# Patient Record
Sex: Female | Born: 1950
Health system: Southern US, Community
[De-identification: ages and names within clinical notes are randomized; demographics above are authoritative.]

## PROBLEM LIST (undated history)

## (undated) DIAGNOSIS — M545 Low back pain, unspecified: Secondary | ICD-10-CM

## (undated) DIAGNOSIS — G629 Polyneuropathy, unspecified: Secondary | ICD-10-CM

## (undated) DIAGNOSIS — F419 Anxiety disorder, unspecified: Secondary | ICD-10-CM

## (undated) DIAGNOSIS — E079 Disorder of thyroid, unspecified: Secondary | ICD-10-CM

## (undated) DIAGNOSIS — I10 Essential (primary) hypertension: Secondary | ICD-10-CM

## (undated) DIAGNOSIS — N200 Calculus of kidney: Secondary | ICD-10-CM

## (undated) DIAGNOSIS — K219 Gastro-esophageal reflux disease without esophagitis: Secondary | ICD-10-CM

## (undated) DIAGNOSIS — E039 Hypothyroidism, unspecified: Secondary | ICD-10-CM

## (undated) DIAGNOSIS — Z9049 Acquired absence of other specified parts of digestive tract: Secondary | ICD-10-CM

## (undated) DIAGNOSIS — G43909 Migraine, unspecified, not intractable, without status migrainosus: Secondary | ICD-10-CM

## (undated) DIAGNOSIS — Z90711 Acquired absence of uterus with remaining cervical stump: Secondary | ICD-10-CM

## (undated) DIAGNOSIS — C8590 Non-Hodgkin lymphoma, unspecified, unspecified site: Secondary | ICD-10-CM

## (undated) DIAGNOSIS — Z9221 Personal history of antineoplastic chemotherapy: Secondary | ICD-10-CM

## (undated) HISTORY — PX: CARPAL TUNNEL RELEASE: SHX101

## (undated) HISTORY — DX: Acquired absence of uterus with remaining cervical stump: Z90.711

## (undated) HISTORY — DX: Low back pain, unspecified: M54.50

## (undated) HISTORY — PX: APPENDECTOMY: SHX54

## (undated) HISTORY — DX: Migraine, unspecified, not intractable, without status migrainosus: G43.909

## (undated) HISTORY — PX: CERVICAL SPINE SURGERY: SHX589

## (undated) HISTORY — DX: Disorder of thyroid, unspecified: E07.9

## (undated) HISTORY — DX: Polyneuropathy, unspecified: G62.9

## (undated) HISTORY — DX: Gastro-esophageal reflux disease without esophagitis: K21.9

## (undated) HISTORY — PX: ABDOMINAL HYSTERECTOMY: SHX81

## (undated) HISTORY — PX: TONSILLECTOMY: SUR1361

## (undated) HISTORY — DX: Non-Hodgkin lymphoma, unspecified, unspecified site: C85.90

## (undated) HISTORY — DX: Anxiety disorder, unspecified: F41.9

## (undated) HISTORY — DX: Calculus of kidney: N20.0

---

## 1995-07-15 HISTORY — PX: BREAST BIOPSY: SHX20

## 1998-05-04 ENCOUNTER — Inpatient Hospital Stay (HOSPITAL_COMMUNITY): Admission: RE | Admit: 1998-05-04 | Discharge: 1998-05-06 | Payer: Self-pay | Admitting: Neurosurgery

## 2004-10-26 ENCOUNTER — Ambulatory Visit: Payer: Self-pay | Admitting: Family Medicine

## 2004-10-28 ENCOUNTER — Emergency Department: Payer: Self-pay | Admitting: Emergency Medicine

## 2004-10-28 ENCOUNTER — Other Ambulatory Visit: Payer: Self-pay

## 2004-10-29 ENCOUNTER — Ambulatory Visit: Payer: Self-pay | Admitting: Emergency Medicine

## 2004-11-13 ENCOUNTER — Ambulatory Visit: Payer: Self-pay | Admitting: Family Medicine

## 2005-05-02 ENCOUNTER — Ambulatory Visit: Payer: Self-pay | Admitting: Rheumatology

## 2005-08-14 ENCOUNTER — Ambulatory Visit: Payer: Self-pay | Admitting: General Practice

## 2005-09-16 ENCOUNTER — Ambulatory Visit: Payer: Self-pay | Admitting: Gastroenterology

## 2005-10-16 ENCOUNTER — Ambulatory Visit: Payer: Self-pay | Admitting: Gastroenterology

## 2005-12-18 ENCOUNTER — Ambulatory Visit: Payer: Self-pay | Admitting: Obstetrics and Gynecology

## 2006-12-15 ENCOUNTER — Emergency Department: Payer: Self-pay

## 2006-12-30 ENCOUNTER — Ambulatory Visit: Payer: Self-pay | Admitting: Internal Medicine

## 2008-01-21 ENCOUNTER — Ambulatory Visit: Payer: Self-pay | Admitting: Internal Medicine

## 2008-08-30 ENCOUNTER — Ambulatory Visit: Payer: Self-pay | Admitting: Rheumatology

## 2009-01-30 ENCOUNTER — Ambulatory Visit: Payer: Self-pay | Admitting: Internal Medicine

## 2009-02-06 ENCOUNTER — Ambulatory Visit: Payer: Self-pay | Admitting: Internal Medicine

## 2010-02-01 ENCOUNTER — Ambulatory Visit: Payer: Self-pay | Admitting: Internal Medicine

## 2010-09-05 ENCOUNTER — Ambulatory Visit: Payer: Self-pay | Admitting: Podiatry

## 2010-10-08 ENCOUNTER — Ambulatory Visit: Payer: Self-pay | Admitting: Gastroenterology

## 2011-02-07 ENCOUNTER — Ambulatory Visit: Payer: Self-pay | Admitting: Internal Medicine

## 2012-03-30 ENCOUNTER — Ambulatory Visit: Payer: Self-pay | Admitting: Internal Medicine

## 2013-04-01 ENCOUNTER — Ambulatory Visit: Payer: Self-pay | Admitting: Internal Medicine

## 2013-12-08 DIAGNOSIS — C8590 Non-Hodgkin lymphoma, unspecified, unspecified site: Secondary | ICD-10-CM

## 2013-12-08 HISTORY — DX: Non-Hodgkin lymphoma, unspecified, unspecified site: C85.90

## 2013-12-08 HISTORY — PX: AXILLARY LYMPH NODE BIOPSY: SHX5737

## 2014-04-03 ENCOUNTER — Ambulatory Visit: Payer: Self-pay | Admitting: Internal Medicine

## 2014-04-07 ENCOUNTER — Ambulatory Visit: Payer: Self-pay | Admitting: Internal Medicine

## 2014-04-13 ENCOUNTER — Ambulatory Visit: Payer: Self-pay | Admitting: Oncology

## 2014-04-13 LAB — CBC CANCER CENTER
BASOS ABS: 0.1 x10 3/mm (ref 0.0–0.1)
Basophil %: 1 %
EOS ABS: 0.2 x10 3/mm (ref 0.0–0.7)
EOS PCT: 2.4 %
HCT: 44.7 % (ref 35.0–47.0)
HGB: 14.8 g/dL (ref 12.0–16.0)
Lymphocyte #: 3.3 x10 3/mm (ref 1.0–3.6)
Lymphocyte %: 39.8 %
MCH: 31.5 pg (ref 26.0–34.0)
MCHC: 33.1 g/dL (ref 32.0–36.0)
MCV: 95 fL (ref 80–100)
MONOS PCT: 12.5 %
Monocyte #: 1 x10 3/mm — ABNORMAL HIGH (ref 0.2–0.9)
NEUTROS PCT: 44.3 %
Neutrophil #: 3.6 x10 3/mm (ref 1.4–6.5)
Platelet: 293 x10 3/mm (ref 150–440)
RBC: 4.69 10*6/uL (ref 3.80–5.20)
RDW: 14 % (ref 11.5–14.5)
WBC: 8.2 x10 3/mm (ref 3.6–11.0)

## 2014-04-13 LAB — LACTATE DEHYDROGENASE: LDH: 277 U/L — AB (ref 81–246)

## 2014-04-27 DIAGNOSIS — M47816 Spondylosis without myelopathy or radiculopathy, lumbar region: Secondary | ICD-10-CM | POA: Insufficient documentation

## 2014-04-27 DIAGNOSIS — M5116 Intervertebral disc disorders with radiculopathy, lumbar region: Secondary | ICD-10-CM | POA: Insufficient documentation

## 2014-04-27 DIAGNOSIS — M48061 Spinal stenosis, lumbar region without neurogenic claudication: Secondary | ICD-10-CM | POA: Insufficient documentation

## 2014-05-02 ENCOUNTER — Other Ambulatory Visit: Payer: Self-pay | Admitting: Physical Medicine and Rehabilitation

## 2014-05-02 DIAGNOSIS — M48062 Spinal stenosis, lumbar region with neurogenic claudication: Secondary | ICD-10-CM

## 2014-05-02 DIAGNOSIS — M545 Low back pain, unspecified: Secondary | ICD-10-CM

## 2014-05-02 DIAGNOSIS — M47817 Spondylosis without myelopathy or radiculopathy, lumbosacral region: Secondary | ICD-10-CM

## 2014-05-05 ENCOUNTER — Ambulatory Visit: Payer: Self-pay | Admitting: Surgery

## 2014-05-08 ENCOUNTER — Ambulatory Visit: Payer: Self-pay | Admitting: Oncology

## 2014-05-11 LAB — PATHOLOGY REPORT

## 2014-05-24 ENCOUNTER — Ambulatory Visit
Admission: RE | Admit: 2014-05-24 | Discharge: 2014-05-24 | Disposition: A | Discharge status: Home / Self Care | Payer: BC Managed Care – PPO | Source: Ambulatory Visit | Attending: Physical Medicine and Rehabilitation | Admitting: Physical Medicine and Rehabilitation

## 2014-05-24 DIAGNOSIS — M545 Low back pain, unspecified: Secondary | ICD-10-CM

## 2014-05-24 DIAGNOSIS — M47817 Spondylosis without myelopathy or radiculopathy, lumbosacral region: Secondary | ICD-10-CM

## 2014-05-24 DIAGNOSIS — M48062 Spinal stenosis, lumbar region with neurogenic claudication: Secondary | ICD-10-CM

## 2014-06-01 ENCOUNTER — Ambulatory Visit: Payer: Self-pay | Admitting: Oncology

## 2014-06-07 ENCOUNTER — Ambulatory Visit: Payer: Self-pay | Admitting: Oncology

## 2014-06-13 ENCOUNTER — Ambulatory Visit: Payer: Self-pay | Admitting: Oncology

## 2014-06-13 LAB — CBC CANCER CENTER
Basophil #: 0.1 x10 3/mm (ref 0.0–0.1)
Basophil %: 1.1 %
EOS ABS: 0.2 x10 3/mm (ref 0.0–0.7)
Eosinophil %: 4.1 %
HCT: 42.3 % (ref 35.0–47.0)
HGB: 14.2 g/dL (ref 12.0–16.0)
Lymphocyte #: 2.4 x10 3/mm (ref 1.0–3.6)
Lymphocyte %: 45.2 %
MCH: 32 pg (ref 26.0–34.0)
MCHC: 33.5 g/dL (ref 32.0–36.0)
MCV: 95 fL (ref 80–100)
Monocyte #: 0.6 x10 3/mm (ref 0.2–0.9)
Monocyte %: 11.4 %
NEUTROS PCT: 38.2 %
Neutrophil #: 2 x10 3/mm (ref 1.4–6.5)
PLATELETS: 237 x10 3/mm (ref 150–440)
RBC: 4.43 10*6/uL (ref 3.80–5.20)
RDW: 13.6 % (ref 11.5–14.5)
WBC: 5.2 x10 3/mm (ref 3.6–11.0)

## 2014-06-13 LAB — URINALYSIS, COMPLETE
Bacteria: NONE SEEN
Bilirubin,UR: NEGATIVE
Blood: NEGATIVE
Glucose,UR: NEGATIVE mg/dL (ref 0–75)
Ketone: NEGATIVE
Leukocyte Esterase: NEGATIVE
Nitrite: NEGATIVE
Ph: 6 (ref 4.5–8.0)
Protein: NEGATIVE
RBC,UR: 2 /HPF (ref 0–5)
Specific Gravity: 1.016 (ref 1.003–1.030)
Squamous Epithelial: 1
WBC UR: <1 /HPF (ref 0–5)

## 2014-06-13 LAB — COMPREHENSIVE METABOLIC PANEL
ALK PHOS: 88 U/L
ALT: 21 U/L (ref 12–78)
Albumin: 3.7 g/dL (ref 3.4–5.0)
Anion Gap: 7 (ref 7–16)
BILIRUBIN TOTAL: 0.4 mg/dL (ref 0.2–1.0)
BUN: 13 mg/dL (ref 7–18)
CALCIUM: 9 mg/dL (ref 8.5–10.1)
CHLORIDE: 105 mmol/L (ref 98–107)
Co2: 30 mmol/L (ref 21–32)
Creatinine: 0.84 mg/dL (ref 0.60–1.30)
EGFR (African American): >60
EGFR (Non-African Amer.): >60
Glucose: 117 mg/dL — ABNORMAL HIGH (ref 65–99)
Osmolality: 284 (ref 275–301)
POTASSIUM: 3.7 mmol/L (ref 3.5–5.1)
SGOT(AST): 16 U/L (ref 15–37)
Sodium: 142 mmol/L (ref 136–145)
TOTAL PROTEIN: 7 g/dL (ref 6.4–8.2)

## 2014-06-13 LAB — LACTATE DEHYDROGENASE: LDH: 207 U/L (ref 81–246)

## 2014-06-20 LAB — CBC CANCER CENTER
BASOS PCT: 0.9 %
Basophil #: 0.1 x10 3/mm (ref 0.0–0.1)
Eosinophil #: 0.3 x10 3/mm (ref 0.0–0.7)
Eosinophil %: 3.1 %
HCT: 42.9 % (ref 35.0–47.0)
HGB: 14.3 g/dL (ref 12.0–16.0)
LYMPHS PCT: 44.1 %
Lymphocyte #: 3.5 x10 3/mm (ref 1.0–3.6)
MCH: 31.8 pg (ref 26.0–34.0)
MCHC: 33.3 g/dL (ref 32.0–36.0)
MCV: 96 fL (ref 80–100)
MONO ABS: 0.9 x10 3/mm (ref 0.2–0.9)
Monocyte %: 10.8 %
NEUTROS ABS: 3.3 x10 3/mm (ref 1.4–6.5)
Neutrophil %: 41.1 %
PLATELETS: 259 x10 3/mm (ref 150–440)
RBC: 4.49 10*6/uL (ref 3.80–5.20)
RDW: 13.4 % (ref 11.5–14.5)
WBC: 8 x10 3/mm (ref 3.6–11.0)

## 2014-06-27 LAB — CBC CANCER CENTER
BASOS ABS: 0.1 x10 3/mm (ref 0.0–0.1)
Basophil %: 1 %
Eosinophil #: 0.2 x10 3/mm (ref 0.0–0.7)
Eosinophil %: 2.9 %
HCT: 43.1 % (ref 35.0–47.0)
HGB: 14.3 g/dL (ref 12.0–16.0)
LYMPHS ABS: 3.3 x10 3/mm (ref 1.0–3.6)
LYMPHS PCT: 42.1 %
MCH: 31.5 pg (ref 26.0–34.0)
MCHC: 33.1 g/dL (ref 32.0–36.0)
MCV: 95 fL (ref 80–100)
MONOS PCT: 13.7 %
Monocyte #: 1.1 x10 3/mm — ABNORMAL HIGH (ref 0.2–0.9)
Neutrophil #: 3.1 x10 3/mm (ref 1.4–6.5)
Neutrophil %: 40.3 %
Platelet: 251 x10 3/mm (ref 150–440)
RBC: 4.53 10*6/uL (ref 3.80–5.20)
RDW: 13.7 % (ref 11.5–14.5)
WBC: 7.8 x10 3/mm (ref 3.6–11.0)

## 2014-07-08 ENCOUNTER — Ambulatory Visit: Payer: Self-pay | Admitting: Oncology

## 2014-07-11 LAB — CBC CANCER CENTER
BASOS PCT: 1 %
Basophil #: 0.1 x10 3/mm (ref 0.0–0.1)
EOS ABS: 0.2 x10 3/mm (ref 0.0–0.7)
Eosinophil %: 3.6 %
HCT: 41 % (ref 35.0–47.0)
HGB: 13.7 g/dL (ref 12.0–16.0)
LYMPHS ABS: 2.5 x10 3/mm (ref 1.0–3.6)
Lymphocyte %: 39.5 %
MCH: 32 pg (ref 26.0–34.0)
MCHC: 33.3 g/dL (ref 32.0–36.0)
MCV: 96 fL (ref 80–100)
Monocyte #: 0.8 x10 3/mm (ref 0.2–0.9)
Monocyte %: 12.3 %
NEUTROS ABS: 2.8 x10 3/mm (ref 1.4–6.5)
Neutrophil %: 43.6 %
PLATELETS: 251 x10 3/mm (ref 150–440)
RBC: 4.27 10*6/uL (ref 3.80–5.20)
RDW: 13.6 % (ref 11.5–14.5)
WBC: 6.5 x10 3/mm (ref 3.6–11.0)

## 2014-07-11 LAB — COMPREHENSIVE METABOLIC PANEL
ALK PHOS: 84 U/L
ALT: 25 U/L
AST: 23 U/L (ref 15–37)
Albumin: 3.6 g/dL (ref 3.4–5.0)
Anion Gap: 7 (ref 7–16)
BUN: 10 mg/dL (ref 7–18)
Bilirubin,Total: 0.4 mg/dL (ref 0.2–1.0)
CALCIUM: 8.9 mg/dL (ref 8.5–10.1)
CO2: 27 mmol/L (ref 21–32)
Chloride: 107 mmol/L (ref 98–107)
Creatinine: 0.85 mg/dL (ref 0.60–1.30)
EGFR (African American): >60
EGFR (Non-African Amer.): >60
Glucose: 101 mg/dL — ABNORMAL HIGH (ref 65–99)
Osmolality: 280 (ref 275–301)
POTASSIUM: 4 mmol/L (ref 3.5–5.1)
Sodium: 141 mmol/L (ref 136–145)
Total Protein: 7 g/dL (ref 6.4–8.2)

## 2014-08-08 ENCOUNTER — Ambulatory Visit: Payer: Self-pay | Admitting: Oncology

## 2014-08-29 ENCOUNTER — Ambulatory Visit: Payer: Self-pay | Admitting: Oncology

## 2014-09-07 ENCOUNTER — Ambulatory Visit: Payer: Self-pay | Admitting: Oncology

## 2014-10-14 ENCOUNTER — Ambulatory Visit: Payer: Self-pay | Admitting: Family Medicine

## 2014-10-31 ENCOUNTER — Ambulatory Visit: Payer: Self-pay | Admitting: Family Medicine

## 2014-11-29 DIAGNOSIS — C8590 Non-Hodgkin lymphoma, unspecified, unspecified site: Secondary | ICD-10-CM | POA: Insufficient documentation

## 2014-12-04 ENCOUNTER — Ambulatory Visit: Payer: Self-pay | Admitting: Oncology

## 2014-12-04 LAB — CBC CANCER CENTER
BASOS PCT: 1.1 %
Basophil #: 0.1 x10 3/mm (ref 0.0–0.1)
EOS ABS: 0.2 x10 3/mm (ref 0.0–0.7)
Eosinophil %: 2.6 %
HCT: 43.5 % (ref 35.0–47.0)
HGB: 14.5 g/dL (ref 12.0–16.0)
LYMPHS PCT: 46.7 %
Lymphocyte #: 3.3 x10 3/mm (ref 1.0–3.6)
MCH: 31 pg (ref 26.0–34.0)
MCHC: 33.4 g/dL (ref 32.0–36.0)
MCV: 93 fL (ref 80–100)
Monocyte #: 1 x10 3/mm — ABNORMAL HIGH (ref 0.2–0.9)
Monocyte %: 13.7 %
NEUTROS ABS: 2.6 x10 3/mm (ref 1.4–6.5)
NEUTROS PCT: 35.9 %
PLATELETS: 274 x10 3/mm (ref 150–440)
RBC: 4.68 10*6/uL (ref 3.80–5.20)
RDW: 13.6 % (ref 11.5–14.5)
WBC: 7.1 x10 3/mm (ref 3.6–11.0)

## 2014-12-04 LAB — BASIC METABOLIC PANEL
Anion Gap: 7 (ref 7–16)
BUN: 11 mg/dL (ref 7–18)
CALCIUM: 8.9 mg/dL (ref 8.5–10.1)
CHLORIDE: 104 mmol/L (ref 98–107)
CO2: 31 mmol/L (ref 21–32)
CREATININE: 0.83 mg/dL (ref 0.60–1.30)
EGFR (Non-African Amer.): >60
Glucose: 121 mg/dL — ABNORMAL HIGH (ref 65–99)
OSMOLALITY: 284 (ref 275–301)
Potassium: 4.5 mmol/L (ref 3.5–5.1)
SODIUM: 142 mmol/L (ref 136–145)

## 2014-12-04 LAB — LACTATE DEHYDROGENASE: LDH: 205 U/L (ref 81–246)

## 2014-12-08 ENCOUNTER — Ambulatory Visit: Payer: Self-pay | Admitting: Oncology

## 2014-12-15 ENCOUNTER — Ambulatory Visit: Payer: Self-pay | Admitting: Orthopedic Surgery

## 2015-01-08 ENCOUNTER — Ambulatory Visit: Payer: Self-pay | Admitting: Orthopedic Surgery

## 2015-01-08 HISTORY — PX: JOINT REPLACEMENT: SHX530

## 2015-01-08 LAB — URINALYSIS, COMPLETE
Bacteria: NONE SEEN
Bilirubin,UR: NEGATIVE
Blood: NEGATIVE
Glucose,UR: NEGATIVE mg/dL
Ketone: NEGATIVE
Nitrite: NEGATIVE
Ph: 7
Protein: NEGATIVE
RBC,UR: NONE SEEN /HPF
Specific Gravity: 1.017
Squamous Epithelial: <1
Transitional Epi: 1
WBC UR: 4 /HPF

## 2015-01-08 LAB — PROTIME-INR
INR: 1
Prothrombin Time: 13.6 secs

## 2015-01-08 LAB — CBC
HCT: 43.6 % (ref 35.0–47.0)
HGB: 14.5 g/dL (ref 12.0–16.0)
MCH: 30.9 pg (ref 26.0–34.0)
MCHC: 33.2 g/dL (ref 32.0–36.0)
MCV: 93 fL (ref 80–100)
Platelet: 246 10*3/uL (ref 150–440)
RBC: 4.68 10*6/uL (ref 3.80–5.20)
RDW: 13.8 % (ref 11.5–14.5)
WBC: 6.2 10*3/uL (ref 3.6–11.0)

## 2015-01-08 LAB — BASIC METABOLIC PANEL WITH GFR
Anion Gap: 6 — ABNORMAL LOW
BUN: 11 mg/dL
Calcium, Total: 9.3 mg/dL
Chloride: 107 mmol/L
Co2: 26 mmol/L
Creatinine: 0.63 mg/dL
EGFR (African American): >60
EGFR (Non-African Amer.): >60
Glucose: 96 mg/dL
Osmolality: 277
Potassium: 4.3 mmol/L
Sodium: 139 mmol/L

## 2015-01-08 LAB — SEDIMENTATION RATE: Erythrocyte Sed Rate: 4 mm/h

## 2015-01-08 LAB — MRSA PCR SCREENING

## 2015-01-08 LAB — APTT: Activated PTT: 28.1 s

## 2015-01-16 ENCOUNTER — Inpatient Hospital Stay: Payer: Self-pay | Admitting: Orthopedic Surgery

## 2015-03-19 ENCOUNTER — Ambulatory Visit: Payer: Self-pay | Admitting: Podiatry

## 2015-03-23 ENCOUNTER — Other Ambulatory Visit: Payer: Self-pay | Admitting: Oncology

## 2015-03-23 DIAGNOSIS — C8298 Follicular lymphoma, unspecified, lymph nodes of multiple sites: Secondary | ICD-10-CM

## 2015-03-28 ENCOUNTER — Ambulatory Visit (INDEPENDENT_AMBULATORY_CARE_PROVIDER_SITE_OTHER): Payer: Medicare Other | Admitting: Podiatry

## 2015-03-28 ENCOUNTER — Encounter: Payer: Self-pay | Admitting: Podiatry

## 2015-03-28 ENCOUNTER — Ambulatory Visit (INDEPENDENT_AMBULATORY_CARE_PROVIDER_SITE_OTHER): Payer: Medicare Other

## 2015-03-28 VITALS — BP 143/74 | HR 60 | Resp 16

## 2015-03-28 DIAGNOSIS — G5760 Lesion of plantar nerve, unspecified lower limb: Secondary | ICD-10-CM

## 2015-03-28 DIAGNOSIS — M79676 Pain in unspecified toe(s): Secondary | ICD-10-CM | POA: Diagnosis not present

## 2015-03-28 DIAGNOSIS — G588 Other specified mononeuropathies: Secondary | ICD-10-CM

## 2015-03-28 DIAGNOSIS — M779 Enthesopathy, unspecified: Secondary | ICD-10-CM

## 2015-03-28 NOTE — Progress Notes (Signed)
   Subjective:    Patient ID: Kaitlin Thompson, female    DOB: 1951/04/09, 64 y.o.   MRN: [id number]  HPI Comments: "I have problems with my toes on both feet"  Patient c/o stinging sensations tips of 3rd and 4th toes bilateral for a couple years, worsened over the years. She had foot surgery by Dr Selina Cooley few years ago. Feels like a blister on the ends of the toes. Sometimes feels like rocks in her shoes. Some swelling. Last year went throught treatment for non hodgkins lymphoma. Dr. Elvina Mattes injected the forefoot a couple months ago and she says made symptoms worse.     Review of Systems  HENT: Positive for ear pain, sinus pressure, sneezing and tinnitus.   Eyes: Positive for redness and itching.  Cardiovascular: Positive for leg swelling.  Gastrointestinal: Positive for abdominal distention.  Genitourinary: Positive for frequency.  Musculoskeletal: Positive for back pain and arthralgias.  Neurological: Positive for dizziness, light-headedness and headaches.  Hematological: Positive for adenopathy.  All other systems reviewed and are negative.      Objective:   Physical Exam: I have reviewed her past medical history medications allergy surgery social history and review of systems. Pulses are strongly palpable bilateral. Neurologic sensorium is intact bilateral. She does have pain on palpation to the third interdigital space with a palpable Mulder's click. Deep tendon reflexes are intact muscle strength is intact bilateral. Orthopedic evaluation demonstrates all joints distal to the ankle have a full range of motion without crepitation. Radiographic evaluation does demonstrate second metatarsal osteotomy bilateral with screw fixation.        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  Plan: Started dehydrated alcohol injections to the bilateral foot today and I will follow-up with her in 3 weeks for her second dose.

## 2015-03-31 NOTE — Op Note (Signed)
PATIENT NAME:  Kaitlin Thompson, Kaitlin Thompson MR#:  [id number] DATE OF BIRTH:  [birth year]  DATE OF PROCEDURE:  05/05/2014  PREOPERATIVE DIAGNOSIS:  Axillary adenopathy.   POSTOPERATIVE DIAGNOSIS:  Axillary adenopathy.   PROCEDURE:  Excision of left axillary lymph node.   SURGEON:  Loreli Dollar, M.D.   ANESTHESIA:  General.   INDICATIONS:  This 64 year old female has had recent findings of diffuse enlargement of lymph nodes. CT scan demonstrated bilateral axillary adenopathy and excision of left axillary lymph node was recommended for a further evaluation.   DESCRIPTION OF PROCEDURE:  The patient was placed on the operating table in the supine position under general anesthesia. The left arm was placed on a lateral arm support. The left axilla, left breast, upper arm and chest wall were prepared with ChloraPrep and draped in a sterile manner.   Immediately prior to incision, the axilla was palpated, and I did not feel a lymph node; however, an incision was made in the inferior aspect of the axilla and carried down through subcutaneous tissues. The superficial fascia was incised exposing the axillary fat pad. Further dissection was carried down through this fat pad to encounter a 2-cm lymph node deep within the axilla adjacent to the chest wall. This lymph node was smooth and of typical firmness. It was dissected free from surrounding structures using electrocautery for hemostasis. The vascular pedicle was suture ligated with 4-0 chromic, and the lymph node was excised and was submitted fresh for pathology, possible lymphoma workup. The wound was further inspected. It is noted that during the course of the procedure, a number of small bleeding points were cauterized. Hemostasis was intact. The superficial fascia was closed with interrupted 4-0 chromic, and the skin was closed with running 5-0 Monocryl subcuticular suture and Dermabond. The patient tolerated the procedure satisfactorily and was then prepared for a  transfer to the recovery room.   ____________________________ Lenna Sciara. Rochel Brome, MD jws:jm D: 05/05/2014 11:38:54 ET T: 05/05/2014 12:23:26 ET JOB#: [id number]  cc: Loreli Dollar, MD, <Dictator> Loreli Dollar MD ELECTRONICALLY SIGNED 05/05/2014 19:30

## 2015-04-02 LAB — SURGICAL PATHOLOGY

## 2015-04-05 ENCOUNTER — Ambulatory Visit
Admit: 2015-04-05 | Disposition: A | Discharge status: Home / Self Care | Payer: Self-pay | Attending: Internal Medicine | Admitting: Internal Medicine

## 2015-04-08 NOTE — Discharge Summary (Signed)
PATIENT NAME:  Kaitlin Thompson, Kaitlin Thompson MR#:  [id number] DATE OF BIRTH:  1951-12-07  DATE OF ADMISSION:  01/16/2015 DATE OF DISCHARGE:  01/19/2015   ADMITTING DIAGNOSIS: Left knee osteoarthritis.   DISCHARGE DIAGNOSIS: Left knee osteoarthritis.   PROCEDURE: Left total knee replacement.   SURGEON: Laurene Footman, MD   ASSISTANT: Rachelle Hora, PA-C   ANESTHESIA: Spinal.   ESTIMATED BLOOD LOSS: 200 mL.   COMPLICATIONS: None.   SPECIMEN: Cut ends of bone.   TOURNIQUET TIME: 84 minutes at 300 mmHg.   IMPLANTS: Medacta GMK sphere 2+ left femur, with an 11 mm x 30 stem on the tibia, with a 2 left fixed tibial component, 2 left 12 mm Flex insert, and a size 2 patella. All components cemented.   HISTORY: The patient is a 64 year old female who has had significant left knee pain which has interfered with her activities of daily living. She has failed nonoperative treatment options, including NSAIDs, pain medications, cortisone shots and viscous supplementation. The patient has agreed to consent to a left total knee arthroplasty.   PHYSICAL EXAMINATION:  GENERAL: Well developed, well nourished female in no apparent distress. Normal affect. Antalgic component to the left lower extremity.  HEENT: Head normocephalic, atraumatic. Pupils equal, round and reactive to light.  HEART: Regular rate and rhythm.  LUNGS: Clear to auscultation bilaterally. No wheezing, rales or rhonchi.  LEFT KNEE: Examination of the left knee shows the patient has no swelling, warmth,  erythema or effusion. She is tender along the medial and lateral joint lines. She has painful range of motion with crepitus. She is neurovascularly intact in the left lower extremity. She has a negative Homan sign.   HOSPITAL COURSE: The patient was admitted to the hospital on 01/16/2015. She had surgery that same day and was brought to the orthopedic floor from the PACU in stable condition. On postoperative day 1, the patient had normal labs and  vital signs. She had good progress with physical therapy. On postoperative day 2, she continued to progress with physical therapy. On postoperative day 3, she continued to have good progress with physical therapy. By 01/19/2015, postoperative day three3 the patient was stable and ready for discharge home with home health and home PT.   CONDITION AT DISCHARGE: Stable.   DISCHARGE INSTRUCTIONS: The patient may increase weightbearing on the affected extremity. Elevate the affected foot or leg on 1-2 pillows with the foot higher than the knee. Thigh-high TED hose on both legs and remove at bedtime; replace when arising the next morning. Elevate the heels off the bed. Incentive spirometer every hour while awake. Encourage cough and deep breathing. The patient may resume a regular diet as tolerated. Continue using Polar Care unit maintaining a temp between 40-50 degrees. Do not get the dressing or bandage wet or dirty. Call [facility] if the dressing has water under it. Leave the dressing on. Call [facility] if any of the following occur: Bright red bleeding from the incisional wound, fever above 101.5 degrees, redness, swelling, or drainage at the incision site. Call [facility] if you experience any increased leg pain, numbness, weakness in legs, or bowel or bladder symptoms. Home physical therapy has been arranged for continuation of rehab program. Call [facility] for followup in 2 weeks.   DISCHARGE MEDICATIONS: Please see the list of discharge medications on the discharge instructions.    ____________________________ T. Rachelle Hora, PA-C tcg:MT D: 01/19/2015 09:52:05 ET T: 01/19/2015 10:35:55 ET JOB#: [id number]  cc:  Ronney Asters, PA-C, <Dictator> Duanne Guess PA ELECTRONICALLY SIGNED 01/25/2015 12:15

## 2015-04-08 NOTE — Op Note (Signed)
PATIENT NAME:  Kaitlin Thompson, GALLERY MR#:  [id number] DATE OF BIRTH:  Sep 20, 1951  DATE OF PROCEDURE:  01/16/2015  PREOPERATIVE DIAGNOSIS: Left knee osteoarthritis.   POSTOPERATIVE DIAGNOSIS: Left knee osteoarthritis.   PROCEDURE: Left total knee replacement.   SURGEON: Hessie Knows, MD   ASSISTANT: Rachelle Hora, PA-C   ANESTHESIA: Spinal.   DESCRIPTION OF PROCEDURE: The patient was brought to the operating room and after adequate spinal anesthesia was obtained, the left leg was prepped and draped in the usual sterile fashion with a tourniquet applied to the upper thigh. After patient identification, timeout procedures were completed. The leg was exsanguinated with an Esmarch and the tourniquet raised to 300 mmHg. A midline skin incision was made, followed by a medial parapatellar arthrotomy. Inspection of the knee revealed extensive synovitis as well as significant wear to the medial joint and patellofemoral joint. The ACL and PCL were excised along with the anterior horns of the menisci. The proximal tibia was exposed for application of the Toluca cutting guide. After this was placed, an alignment checked, the proximal tibia cut was carried out and the proximal tibia cut removed. Identical procedure carried out on the femur with femoral guide attached. After removing cartilage in the appropriate areas, the distal femoral cut was made, pins placed from the block and the 2+ cutting guide was applied. Anterior, posterior and chamfer cuts made. Residual posterior horn of the menisci were excised and the size 2 tibial tray was inserted, drilled, and short stem preparation carried out. The 2 implant was then impacted into place and fit well. Next, on the femoral side, the 2+ trial was placed with a 12 mm insert. There was good stability and full extension. The distal femoral drill holes were made and the trochlear groove cut was carried out at this time. The trials were removed and the patella cut with patellar  cutting guide. It measured to a size 2 after drill holes were placed. Tourniquet was let down and hemostasis checked with electrocautery. The knee was infiltrated with Exparel diluted with saline in multiple locations along with a combination of Toradol, morphine and Sensorcaine with epinephrine 0.25%. After this was completed, the tourniquet was raised and the bony surfaces thoroughly irrigated and dried. The tibial component was cemented into place first, followed by the 12 mm insert and then the femoral component. The knee was held in extension as the cement set and excess cement had been removed. The patellar button was clamped into place. After the cement had set, the knee was checked, the patella tracked well with no-touch technique. The knee was thoroughly irrigated and Betadine lavage also carried out. The arthrotomy was then repaired with a heavy Quill suture, 2-0 Quill subcutaneously and skin staples. Xeroform, 4 x 4's, ABD, Webril, Polar Care and Ace wrap were then applied. The patient was sent to the recovery room in stable condition.   ESTIMATED BLOOD LOSS: 200 mL.   COMPLICATIONS: None.   SPECIMEN: Cut ends of bone.   TOURNIQUET TIME: Eighty-four minutes at 300 mmHg.  IMPLANTS: Medacta GMK sphere 2+ left femur with an 11 mm x 30 stem on the tibia with a 2 left fixed tibia component, 2 left 12 mm flex insert, and a size 2 patella. All components cemented.   ____________________________ Laurene Footman, MD mjm:TM D: 01/16/2015 21:35:00 ET T: 01/17/2015 00:47:23 ET JOB#: [id number]  cc: Laurene Footman, MD, <Dictator> Laurene Footman MD ELECTRONICALLY SIGNED 01/17/2015 8:21

## 2015-04-18 ENCOUNTER — Ambulatory Visit (INDEPENDENT_AMBULATORY_CARE_PROVIDER_SITE_OTHER): Payer: Medicare Other | Admitting: Podiatry

## 2015-04-18 VITALS — BP 147/82 | HR 66 | Resp 16

## 2015-04-18 DIAGNOSIS — G588 Other specified mononeuropathies: Secondary | ICD-10-CM

## 2015-04-18 DIAGNOSIS — G5760 Lesion of plantar nerve, unspecified lower limb: Secondary | ICD-10-CM

## 2015-04-19 NOTE — Progress Notes (Signed)
She presents today for follow-up of neuroma third interdigital space bilateral. She states that after the first alcohol injection does very little change.  Objective: Vital signs are stable she is alert and oriented 3 she has palpable Mulder's click to the third interdigital space bilateral.  Assessment: Neuroma third interdigital space bilateral.  Plan: Reinjected her second dose of dehydrated alcohol bilaterally today to the third interdigital space. Follow-up with her in 3 weeks

## 2015-05-09 ENCOUNTER — Ambulatory Visit (INDEPENDENT_AMBULATORY_CARE_PROVIDER_SITE_OTHER): Payer: Medicare Other | Admitting: Podiatry

## 2015-05-09 VITALS — BP 154/85 | HR 70 | Resp 16

## 2015-05-09 DIAGNOSIS — G5760 Lesion of plantar nerve, unspecified lower limb: Secondary | ICD-10-CM | POA: Diagnosis not present

## 2015-05-09 DIAGNOSIS — G588 Other specified mononeuropathies: Secondary | ICD-10-CM

## 2015-05-09 NOTE — Progress Notes (Signed)
She presents today for follow-up of neuroma third interdigital space bilateral. She states they're still sore but occasionally she can feel some improvement.  Objective: Vital signs stable she is alert and oriented 3 palpable Mulder's click third interdigital space bilateral.  Assessment: Neuroma third interdigital space bilateral.  Plan: Injected dehydrated alcohol 2 bilateral third interdigital space today this was injection #3. I will follow up with her in 3 weeks for #4 if necessary.

## 2015-05-30 ENCOUNTER — Ambulatory Visit: Payer: Medicare Other | Admitting: Podiatry

## 2015-06-04 ENCOUNTER — Other Ambulatory Visit: Payer: Self-pay | Admitting: *Deleted

## 2015-06-04 ENCOUNTER — Ambulatory Visit (INDEPENDENT_AMBULATORY_CARE_PROVIDER_SITE_OTHER): Payer: Medicare Other | Admitting: Podiatry

## 2015-06-04 VITALS — BP 152/88 | HR 68 | Resp 16

## 2015-06-04 DIAGNOSIS — D3610 Benign neoplasm of peripheral nerves and autonomic nervous system, unspecified: Secondary | ICD-10-CM | POA: Diagnosis not present

## 2015-06-04 DIAGNOSIS — R591 Generalized enlarged lymph nodes: Secondary | ICD-10-CM

## 2015-06-04 NOTE — Progress Notes (Signed)
She presents today for follow-up of neuroma third interdigital space bilateral. She states still sore but she does have some improvement.  Objective: Vital signs stable she is alert and oriented 3 palpable Mulder's click third interdigital space bilateral.  Assessment: Neuroma third interdigital space bilateral.  Plan: Injected dehydrated alcohol to bilateral third interdigital spaces today this was injection #4. I will follow up with her in 5 weeks for injection #5 if needed.

## 2015-06-05 ENCOUNTER — Other Ambulatory Visit: Payer: Self-pay | Admitting: *Deleted

## 2015-06-05 DIAGNOSIS — R591 Generalized enlarged lymph nodes: Secondary | ICD-10-CM

## 2015-06-06 ENCOUNTER — Inpatient Hospital Stay
Admission: RE | Admit: 2015-06-06 | Discharge: 2015-06-06 | Disposition: A | Discharge status: Home / Self Care | Payer: Self-pay | Source: Ambulatory Visit | Attending: Oncology | Admitting: Oncology

## 2015-06-06 ENCOUNTER — Inpatient Hospital Stay: Payer: Medicare Other | Attending: Oncology

## 2015-06-06 ENCOUNTER — Other Ambulatory Visit: Payer: Self-pay

## 2015-06-06 ENCOUNTER — Ambulatory Visit
Admission: RE | Admit: 2015-06-06 | Discharge: 2015-06-06 | Disposition: A | Discharge status: Home / Self Care | Payer: Medicare Other | Source: Ambulatory Visit | Attending: Oncology | Admitting: Oncology

## 2015-06-06 DIAGNOSIS — C8298 Follicular lymphoma, unspecified, lymph nodes of multiple sites: Secondary | ICD-10-CM | POA: Insufficient documentation

## 2015-06-06 DIAGNOSIS — R1900 Intra-abdominal and pelvic swelling, mass and lump, unspecified site: Secondary | ICD-10-CM | POA: Diagnosis not present

## 2015-06-06 DIAGNOSIS — C8290 Follicular lymphoma, unspecified, unspecified site: Secondary | ICD-10-CM | POA: Insufficient documentation

## 2015-06-06 DIAGNOSIS — Z08 Encounter for follow-up examination after completed treatment for malignant neoplasm: Secondary | ICD-10-CM | POA: Diagnosis present

## 2015-06-06 DIAGNOSIS — R590 Localized enlarged lymph nodes: Secondary | ICD-10-CM | POA: Diagnosis not present

## 2015-06-06 DIAGNOSIS — R591 Generalized enlarged lymph nodes: Secondary | ICD-10-CM

## 2015-06-06 HISTORY — DX: Essential (primary) hypertension: I10

## 2015-06-06 LAB — CBC WITH DIFFERENTIAL/PLATELET
BASOS ABS: 0.1 10*3/uL (ref 0–0.1)
BASOS PCT: 1 %
EOS PCT: 2 %
Eosinophils Absolute: 0.2 10*3/uL (ref 0–0.7)
HCT: 45.7 % (ref 35.0–47.0)
Hemoglobin: 15 g/dL (ref 12.0–16.0)
Lymphocytes Relative: 51 %
Lymphs Abs: 3.4 10*3/uL (ref 1.0–3.6)
MCH: 30.5 pg (ref 26.0–34.0)
MCHC: 32.8 g/dL (ref 32.0–36.0)
MCV: 92.9 fL (ref 80.0–100.0)
MONO ABS: 0.8 10*3/uL (ref 0.2–0.9)
Monocytes Relative: 11 %
NEUTROS ABS: 2.4 10*3/uL (ref 1.4–6.5)
Neutrophils Relative %: 35 %
Platelets: 275 10*3/uL (ref 150–440)
RBC: 4.92 MIL/uL (ref 3.80–5.20)
RDW: 14 % (ref 11.5–14.5)
WBC: 6.7 10*3/uL (ref 3.6–11.0)

## 2015-06-06 LAB — LACTATE DEHYDROGENASE: LDH: 177 U/L (ref 98–192)

## 2015-06-06 MED ORDER — IOHEXOL 350 MG/ML SOLN
100.0000 mL | Freq: Once | INTRAVENOUS | Status: AC | PRN
  Administered 2015-06-06: 100 mL via INTRAVENOUS

## 2015-06-07 LAB — MISC LABCORP TEST (SEND OUT): Labcorp test code: 480260

## 2015-06-13 ENCOUNTER — Inpatient Hospital Stay: Payer: Medicare Other | Attending: Oncology | Admitting: Oncology

## 2015-06-13 ENCOUNTER — Encounter: Payer: Self-pay | Admitting: Oncology

## 2015-06-13 VITALS — BP 118/90 | HR 78 | Temp 98.0°F | Resp 20 | Wt 213.2 lb

## 2015-06-13 DIAGNOSIS — C8300 Small cell B-cell lymphoma, unspecified site: Secondary | ICD-10-CM | POA: Diagnosis present

## 2015-06-13 DIAGNOSIS — M549 Dorsalgia, unspecified: Secondary | ICD-10-CM | POA: Diagnosis not present

## 2015-06-13 DIAGNOSIS — K219 Gastro-esophageal reflux disease without esophagitis: Secondary | ICD-10-CM | POA: Diagnosis not present

## 2015-06-13 DIAGNOSIS — G8929 Other chronic pain: Secondary | ICD-10-CM | POA: Diagnosis not present

## 2015-06-13 DIAGNOSIS — Z800 Family history of malignant neoplasm of digestive organs: Secondary | ICD-10-CM | POA: Diagnosis not present

## 2015-06-13 DIAGNOSIS — Z807 Family history of other malignant neoplasms of lymphoid, hematopoietic and related tissues: Secondary | ICD-10-CM | POA: Diagnosis not present

## 2015-06-13 DIAGNOSIS — Z803 Family history of malignant neoplasm of breast: Secondary | ICD-10-CM | POA: Insufficient documentation

## 2015-06-13 DIAGNOSIS — Z90710 Acquired absence of both cervix and uterus: Secondary | ICD-10-CM

## 2015-06-13 DIAGNOSIS — Z87442 Personal history of urinary calculi: Secondary | ICD-10-CM | POA: Diagnosis not present

## 2015-06-13 DIAGNOSIS — M25569 Pain in unspecified knee: Secondary | ICD-10-CM | POA: Diagnosis not present

## 2015-06-13 DIAGNOSIS — Z7982 Long term (current) use of aspirin: Secondary | ICD-10-CM | POA: Diagnosis not present

## 2015-06-13 DIAGNOSIS — Z8041 Family history of malignant neoplasm of ovary: Secondary | ICD-10-CM | POA: Diagnosis not present

## 2015-06-13 DIAGNOSIS — C8580 Other specified types of non-Hodgkin lymphoma, unspecified site: Secondary | ICD-10-CM

## 2015-06-13 DIAGNOSIS — Z9221 Personal history of antineoplastic chemotherapy: Secondary | ICD-10-CM

## 2015-06-13 DIAGNOSIS — K5730 Diverticulosis of large intestine without perforation or abscess without bleeding: Secondary | ICD-10-CM | POA: Insufficient documentation

## 2015-06-13 DIAGNOSIS — Z79899 Other long term (current) drug therapy: Secondary | ICD-10-CM | POA: Diagnosis not present

## 2015-06-13 DIAGNOSIS — I10 Essential (primary) hypertension: Secondary | ICD-10-CM | POA: Diagnosis not present

## 2015-06-13 DIAGNOSIS — F419 Anxiety disorder, unspecified: Secondary | ICD-10-CM | POA: Diagnosis not present

## 2015-06-13 DIAGNOSIS — E039 Hypothyroidism, unspecified: Secondary | ICD-10-CM | POA: Diagnosis not present

## 2015-06-13 NOTE — Progress Notes (Signed)
Patient here today for follow up regarding lymphoma, patient reports "lump" to left breast.

## 2015-06-29 NOTE — Progress Notes (Signed)
[facility]  Telephone:(336) [phone] Fax:(336) [phone]  ID: Kaitlin Thompson OB: 1951-02-28  MR#: [id number]  UMP#:[id number]  Patient Care Team: Tracie Harrier, MD as PCP - General (Internal Medicine)  CHIEF COMPLAINT:  Chief Complaint  Patient presents with  . Follow-up    lymphoma    INTERVAL HISTORY: Patient returns to clinic today for further evaluation and discussion of her imaging results. She continues to have chronic back and knee pain, but otherwise feels well. She has no neurologic complaints. She denies any recent fevers. She has no chest pain or shortness of breath. She has a good appetite and denies any nausea, vomiting, constipation, or diarrhea. She has no urinary complaints. Patient offers no further specific complaints today.   REVIEW OF SYSTEMS:   Review of Systems  Constitutional: Negative for fever, weight loss and malaise/fatigue.  Respiratory: Negative.   Cardiovascular: Negative.   Neurological: Negative for weakness.    As per HPI. Otherwise, a complete review of systems is negatve.  PAST MEDICAL HISTORY: Past Medical History  Diagnosis Date  . Non Hodgkin's lymphoma   . Hypertension   . Kidney stones   . Thyroid disease     hypothyroidism  . GERD (gastroesophageal reflux disease)   . Anxiety   . S/P partial hysterectomy     PAST SURGICAL HISTORY: Past Surgical History  Procedure Laterality Date  . Abdominal hysterectomy      FAMILY HISTORY Family History  Problem Relation Age of Onset  . Cancer Other     breast cancer, colon cancer, lymphoma, ovarian cancer  . Diabetes Other   . Anemia Other   . CAD Other   . Liver disease Other        ADVANCED DIRECTIVES:    HEALTH MAINTENANCE: History  Substance Use Topics  . Smoking status: Never Smoker   . Smokeless tobacco: Not on file  . Alcohol Use: Not on file     Colonoscopy:  PAP:  Bone density:  Lipid panel:  Allergies  Allergen Reactions  .  Percocet [Oxycodone-Acetaminophen]     Current Outpatient Prescriptions  Medication Sig Dispense Refill  . ALPRAZolam (XANAX) 0.25 MG tablet Take 0.25 mg by mouth at bedtime as needed for anxiety.    Marland Kitchen amitriptyline (ELAVIL) 25 MG tablet Take 25 mg by mouth at bedtime.    Marland Kitchen aspirin 81 MG tablet Take 81 mg by mouth daily.    Marland Kitchen atenolol (TENORMIN) 50 MG tablet Take 50 mg by mouth daily.    Marland Kitchen levothyroxine (SYNTHROID, LEVOTHROID) 112 MCG tablet Take 112 mcg by mouth daily before breakfast.    . omeprazole (PRILOSEC) 20 MG capsule Take 20 mg by mouth daily.    Marland Kitchen HYDROcodone-acetaminophen (NORCO) 10-325 MG per tablet Take 1 tablet by mouth every 6 (six) hours as needed.     No current facility-administered medications for this visit.    OBJECTIVE: Filed Vitals:   06/13/15 1119  BP: 118/90  Pulse: 78  Temp: 98 F (36.7 C)  Resp: 20     There is no height on file to calculate BMI.    ECOG FS:0 - Asymptomatic  General: Well-developed, well-nourished, no acute distress. Eyes: anicteric sclera. Lungs: Clear to auscultation bilaterally. Heart: Regular rate and rhythm. No rubs, murmurs, or gallops. Abdomen: Soft, nontender, nondistended. No organomegaly noted, normoactive bowel sounds. Musculoskeletal: No edema, cyanosis, or clubbing. Neuro: Alert, answering all questions appropriately. Cranial nerves grossly intact. Skin: No rashes or petechiae noted. Psych: Normal affect. Lymphatics:  No cervical, calvicular, axillary or inguinal LAD.   LAB RESULTS:  Lab Results  Component Value Date   NA 139 01/08/2015   K 4.3 01/08/2015   CL 107 01/08/2015   CO2 26 01/08/2015   GLUCOSE 96 01/08/2015   BUN 11 01/08/2015   CREATININE 0.63 01/08/2015   CALCIUM 9.3 01/08/2015   PROT 7.0 07/11/2014   ALBUMIN 3.6 07/11/2014   AST 23 07/11/2014   ALT 25 07/11/2014   ALKPHOS 84 07/11/2014   BILITOT 0.4 07/11/2014   GFRNONAA >60 07/11/2014   GFRAA >60 07/11/2014    Lab Results  Component  Value Date   WBC 6.7 06/06/2015   NEUTROABS 2.4 06/06/2015   HGB 15.0 06/06/2015   HCT 45.7 06/06/2015   MCV 92.9 06/06/2015   PLT 275 06/06/2015     STUDIES: Ct Chest W Contrast  06/06/2015   CLINICAL DATA:  Lymphoma diagnosed in 2015. Some upper quadrant pain. Patient status post chemotherapy. Subsequent treatment evaluation.  EXAM: CT CHEST, ABDOMEN, AND PELVIS WITH CONTRAST  TECHNIQUE: Multidetector CT imaging of the chest, abdomen and pelvis was performed following the standard protocol during bolus administration of intravenous contrast.  CONTRAST:  137mL OMNIPAQUE IOHEXOL 350 MG/ML SOLN  COMPARISON:  None.  FINDINGS: CT CHEST FINDINGS  Mediastinum/Nodes: 10 mm left axillary lymph node compares to 13 mm on prior, image 8, series 2). Left axial lymph node measures 12 mm short axis on image 11 compared to 12 mm. No new adenopathy. No supraclavicular adenopathy or mediastinal adenopathy. No pericardial fluid. Esophagus normal.  Lungs/Pleura: No pleural fluid. No pulmonary nodularity. Airways are normal.  CT ABDOMEN AND PELVIS FINDINGS  Hepatobiliary: No focal hepatic lesion. No biliary duct dilatation. Gallbladder is normal. Common bile duct is normal.  Pancreas: Pancreas is normal. No ductal dilatation. No pancreatic inflammation.  Spleen: Normal spleen  Adrenals/urinary tract: The small nodules of the left and right adrenal gland are unchanged measuring 13 and 10 mm respectively. These were not hypermetabolic on comparison PET-CT scan and likely represent small adenomas.  Stomach/Bowel: Stomach, small bowel, appendix, and cecum are normal. Multiple diverticula through the sigmoid colon without evidence of acute inflammation.  Vascular/Lymphatic: Abdominal aorta is normal caliber. There are small periaortic lymph nodes ventral less than 10 mm short axis. No central mesenteric adenopathy.  There is a large left external iliac lymph node which measures 23 mm short axis similar 19 mm short axis on CT of  12/04/2014. In coronal dimension lesion measures 18 mm unchanged. Left inguinal lymph node measures 11 mm on image 116, series 2 compared to 10 mm. No new lymph nodes are present.  Reproductive: Rounded mass within the right mid pelvis measures 3.4 by 4.5 cm compared to 3.3 by 3.9 cm. This mass was not significantly metabolic on prior PET-CT scan.  Musculoskeletal: No aggressive osseous lesion.  Other: No free fluid.  IMPRESSION: Chest Impression:  1. Bilateral mild axillary lymphadenopathy is stable. 2. No mediastinal adenopathy. 3. No pulmonary parenchymal lesion.  Abdomen / Pelvis Impression:  1. Essentially stable left external iliac lymph nodes. 2. Stable mass within the central right pelvis. This masses not hypermetabolic on prior PET-CT scan.   Electronically Signed   By: Suzy Bouchard M.D.   On: 06/06/2015 10:33   Ct Abdomen Pelvis W Contrast  06/06/2015   CLINICAL DATA:  Lymphoma diagnosed in 2015. Some upper quadrant pain. Patient status post chemotherapy. Subsequent treatment evaluation.  EXAM: CT CHEST, ABDOMEN, AND PELVIS WITH CONTRAST  TECHNIQUE:  Multidetector CT imaging of the chest, abdomen and pelvis was performed following the standard protocol during bolus administration of intravenous contrast.  CONTRAST:  140mL OMNIPAQUE IOHEXOL 350 MG/ML SOLN  COMPARISON:  None.  FINDINGS: CT CHEST FINDINGS  Mediastinum/Nodes: 10 mm left axillary lymph node compares to 13 mm on prior, image 8, series 2). Left axial lymph node measures 12 mm short axis on image 11 compared to 12 mm. No new adenopathy. No supraclavicular adenopathy or mediastinal adenopathy. No pericardial fluid. Esophagus normal.  Lungs/Pleura: No pleural fluid. No pulmonary nodularity. Airways are normal.  CT ABDOMEN AND PELVIS FINDINGS  Hepatobiliary: No focal hepatic lesion. No biliary duct dilatation. Gallbladder is normal. Common bile duct is normal.  Pancreas: Pancreas is normal. No ductal dilatation. No pancreatic inflammation.   Spleen: Normal spleen  Adrenals/urinary tract: The small nodules of the left and right adrenal gland are unchanged measuring 13 and 10 mm respectively. These were not hypermetabolic on comparison PET-CT scan and likely represent small adenomas.  Stomach/Bowel: Stomach, small bowel, appendix, and cecum are normal. Multiple diverticula through the sigmoid colon without evidence of acute inflammation.  Vascular/Lymphatic: Abdominal aorta is normal caliber. There are small periaortic lymph nodes ventral less than 10 mm short axis. No central mesenteric adenopathy.  There is a large left external iliac lymph node which measures 23 mm short axis similar 19 mm short axis on CT of 12/04/2014. In coronal dimension lesion measures 18 mm unchanged. Left inguinal lymph node measures 11 mm on image 116, series 2 compared to 10 mm. No new lymph nodes are present.  Reproductive: Rounded mass within the right mid pelvis measures 3.4 by 4.5 cm compared to 3.3 by 3.9 cm. This mass was not significantly metabolic on prior PET-CT scan.  Musculoskeletal: No aggressive osseous lesion.  Other: No free fluid.  IMPRESSION: Chest Impression:  1. Bilateral mild axillary lymphadenopathy is stable. 2. No mediastinal adenopathy. 3. No pulmonary parenchymal lesion.  Abdomen / Pelvis Impression:  1. Essentially stable left external iliac lymph nodes. 2. Stable mass within the central right pelvis. This masses not hypermetabolic on prior PET-CT scan.   Electronically Signed   By: Suzy Bouchard M.D.   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 Lymphoma: Left axillary lymph node biopsy performed on May 05, 2014 confirmed B cell, marginal zone lymphoma diagnosis.  CT scan results as above with no clear evidence of disease. No intervention is needed at this time. Patient has stated that if she requires treatment again she will likely refuse any further Rituxan or  chemotherapy. Return to clinic in 6 months with restaging CT scan, laboratory work, further evaluation. Patient expressed understanding and was in agreement with this plan. 2. Back pain: OK to proceed with injections if necessary. 3. Knee pain: Treatment per orthopedics.   Patient expressed understanding and was in agreement with this plan. She also understands that She can call clinic at any time with any questions, concerns, or complaints.   No matching staging information was found for the patient.  Lloyd Huger, MD   06/29/2015 4:05 PM

## 2015-07-16 ENCOUNTER — Ambulatory Visit (INDEPENDENT_AMBULATORY_CARE_PROVIDER_SITE_OTHER): Payer: Medicare Other | Admitting: Podiatry

## 2015-07-16 DIAGNOSIS — G588 Other specified mononeuropathies: Secondary | ICD-10-CM

## 2015-07-16 DIAGNOSIS — G5760 Lesion of plantar nerve, unspecified lower limb: Secondary | ICD-10-CM

## 2015-07-16 NOTE — Progress Notes (Signed)
She presents today for follow-up of her neuromas third interdigital space bilaterally. She states that after the third injection they started to feel better.  Objective: Vital signs are stable she's alert and oriented 3 pulses are palpable bilateral. She has a palpable Mulder's click to the third interdigital space of the bilateral foot.  Assessment: Neuroma third interdigital space bilateral.  Plan: Discussed etiology and pathology conservative versus surgical therapy. Injected her fourth dose of dehydrated alcohol today bilaterally and I will follow-up with her in re-weeks

## 2015-07-27 LAB — COMP PANEL: LEUKEMIA/LYMPHOMA

## 2015-08-06 ENCOUNTER — Other Ambulatory Visit: Payer: Self-pay | Admitting: Internal Medicine

## 2015-08-06 ENCOUNTER — Ambulatory Visit: Payer: Medicare Other | Admitting: Podiatry

## 2015-08-06 DIAGNOSIS — E039 Hypothyroidism, unspecified: Secondary | ICD-10-CM

## 2015-08-06 DIAGNOSIS — C8594 Non-Hodgkin lymphoma, unspecified, lymph nodes of axilla and upper limb: Secondary | ICD-10-CM

## 2015-08-06 DIAGNOSIS — I10 Essential (primary) hypertension: Secondary | ICD-10-CM

## 2015-08-06 DIAGNOSIS — R10819 Abdominal tenderness, unspecified site: Secondary | ICD-10-CM

## 2015-08-08 ENCOUNTER — Ambulatory Visit
Admission: RE | Admit: 2015-08-08 | Discharge: 2015-08-08 | Disposition: A | Discharge status: Home / Self Care | Payer: Medicare Other | Source: Ambulatory Visit | Attending: Internal Medicine | Admitting: Internal Medicine

## 2015-08-08 DIAGNOSIS — I10 Essential (primary) hypertension: Secondary | ICD-10-CM | POA: Insufficient documentation

## 2015-08-08 DIAGNOSIS — R1900 Intra-abdominal and pelvic swelling, mass and lump, unspecified site: Secondary | ICD-10-CM | POA: Insufficient documentation

## 2015-08-08 DIAGNOSIS — C8594 Non-Hodgkin lymphoma, unspecified, lymph nodes of axilla and upper limb: Secondary | ICD-10-CM

## 2015-08-08 DIAGNOSIS — E039 Hypothyroidism, unspecified: Secondary | ICD-10-CM | POA: Diagnosis present

## 2015-08-08 DIAGNOSIS — Z8572 Personal history of non-Hodgkin lymphomas: Secondary | ICD-10-CM | POA: Insufficient documentation

## 2015-08-08 DIAGNOSIS — R10819 Abdominal tenderness, unspecified site: Secondary | ICD-10-CM

## 2015-08-08 MED ORDER — IOHEXOL 300 MG/ML  SOLN
100.0000 mL | Freq: Once | INTRAMUSCULAR | Status: AC | PRN
  Administered 2015-08-08: 100 mL via INTRAVENOUS

## 2015-08-20 ENCOUNTER — Ambulatory Visit (INDEPENDENT_AMBULATORY_CARE_PROVIDER_SITE_OTHER): Payer: Medicare Other | Admitting: Podiatry

## 2015-08-20 ENCOUNTER — Encounter: Payer: Self-pay | Admitting: Podiatry

## 2015-08-20 VITALS — BP 159/92 | HR 69 | Resp 12

## 2015-08-20 DIAGNOSIS — G5760 Lesion of plantar nerve, unspecified lower limb: Secondary | ICD-10-CM

## 2015-08-20 DIAGNOSIS — G588 Other specified mononeuropathies: Secondary | ICD-10-CM

## 2015-08-20 NOTE — Progress Notes (Signed)
She presents today for follow-up of her neuromas third interdigital space bilateral foot. She states they are doing much better however they just feel Rall and burn to the end of the third toes. Other than that they're doing much better.  Objective: Vital signs are stable she is alert and oriented 3. She still has a palpable click to the third interdigital space bilateral. This is mildly tender on palpation. Pulses remain palpable.  Assessment: Resolving neuroma third interdigital space bilateral.  Plan: Reinjected her 6 doses dehydrated alcohol to the third interdigital space bilaterally today. Follow-up with me in 3 weeks

## 2015-09-10 ENCOUNTER — Ambulatory Visit: Payer: Medicare Other | Admitting: Podiatry

## 2015-09-26 ENCOUNTER — Ambulatory Visit: Payer: Medicare Other | Admitting: Podiatry

## 2015-11-06 DIAGNOSIS — M5136 Other intervertebral disc degeneration, lumbar region: Secondary | ICD-10-CM | POA: Insufficient documentation

## 2015-12-14 ENCOUNTER — Ambulatory Visit
Admission: RE | Admit: 2015-12-14 | Discharge: 2015-12-14 | Disposition: A | Discharge status: Home / Self Care | Payer: Medicare Other | Source: Ambulatory Visit | Attending: Oncology | Admitting: Oncology

## 2015-12-14 ENCOUNTER — Inpatient Hospital Stay: Payer: Medicare Other | Attending: Oncology

## 2015-12-14 DIAGNOSIS — R1900 Intra-abdominal and pelvic swelling, mass and lump, unspecified site: Secondary | ICD-10-CM | POA: Insufficient documentation

## 2015-12-14 DIAGNOSIS — C8510 Unspecified B-cell lymphoma, unspecified site: Secondary | ICD-10-CM | POA: Insufficient documentation

## 2015-12-14 DIAGNOSIS — R590 Localized enlarged lymph nodes: Secondary | ICD-10-CM | POA: Diagnosis not present

## 2015-12-14 DIAGNOSIS — Z803 Family history of malignant neoplasm of breast: Secondary | ICD-10-CM | POA: Diagnosis not present

## 2015-12-14 DIAGNOSIS — R5383 Other fatigue: Secondary | ICD-10-CM | POA: Diagnosis not present

## 2015-12-14 DIAGNOSIS — C8580 Other specified types of non-Hodgkin lymphoma, unspecified site: Secondary | ICD-10-CM | POA: Diagnosis present

## 2015-12-14 DIAGNOSIS — Z800 Family history of malignant neoplasm of digestive organs: Secondary | ICD-10-CM | POA: Diagnosis not present

## 2015-12-14 DIAGNOSIS — Z807 Family history of other malignant neoplasms of lymphoid, hematopoietic and related tissues: Secondary | ICD-10-CM | POA: Diagnosis not present

## 2015-12-14 DIAGNOSIS — Z79899 Other long term (current) drug therapy: Secondary | ICD-10-CM | POA: Diagnosis not present

## 2015-12-14 DIAGNOSIS — E039 Hypothyroidism, unspecified: Secondary | ICD-10-CM | POA: Diagnosis not present

## 2015-12-14 DIAGNOSIS — Z7982 Long term (current) use of aspirin: Secondary | ICD-10-CM | POA: Diagnosis not present

## 2015-12-14 DIAGNOSIS — I2510 Atherosclerotic heart disease of native coronary artery without angina pectoris: Secondary | ICD-10-CM | POA: Insufficient documentation

## 2015-12-14 DIAGNOSIS — Z90710 Acquired absence of both cervix and uterus: Secondary | ICD-10-CM | POA: Diagnosis not present

## 2015-12-14 DIAGNOSIS — Z87442 Personal history of urinary calculi: Secondary | ICD-10-CM | POA: Insufficient documentation

## 2015-12-14 DIAGNOSIS — M25569 Pain in unspecified knee: Secondary | ICD-10-CM | POA: Insufficient documentation

## 2015-12-14 DIAGNOSIS — N200 Calculus of kidney: Secondary | ICD-10-CM | POA: Insufficient documentation

## 2015-12-14 DIAGNOSIS — Z8041 Family history of malignant neoplasm of ovary: Secondary | ICD-10-CM | POA: Diagnosis not present

## 2015-12-14 DIAGNOSIS — R160 Hepatomegaly, not elsewhere classified: Secondary | ICD-10-CM | POA: Insufficient documentation

## 2015-12-14 DIAGNOSIS — R531 Weakness: Secondary | ICD-10-CM | POA: Diagnosis not present

## 2015-12-14 DIAGNOSIS — Z9221 Personal history of antineoplastic chemotherapy: Secondary | ICD-10-CM | POA: Diagnosis not present

## 2015-12-14 DIAGNOSIS — F419 Anxiety disorder, unspecified: Secondary | ICD-10-CM | POA: Insufficient documentation

## 2015-12-14 DIAGNOSIS — G8929 Other chronic pain: Secondary | ICD-10-CM | POA: Diagnosis not present

## 2015-12-14 DIAGNOSIS — I10 Essential (primary) hypertension: Secondary | ICD-10-CM | POA: Insufficient documentation

## 2015-12-14 DIAGNOSIS — Z90722 Acquired absence of ovaries, bilateral: Secondary | ICD-10-CM | POA: Diagnosis not present

## 2015-12-14 DIAGNOSIS — M549 Dorsalgia, unspecified: Secondary | ICD-10-CM | POA: Insufficient documentation

## 2015-12-14 DIAGNOSIS — K219 Gastro-esophageal reflux disease without esophagitis: Secondary | ICD-10-CM | POA: Diagnosis not present

## 2015-12-14 DIAGNOSIS — Z08 Encounter for follow-up examination after completed treatment for malignant neoplasm: Secondary | ICD-10-CM | POA: Insufficient documentation

## 2015-12-14 HISTORY — DX: Acquired absence of other specified parts of digestive tract: Z90.49

## 2015-12-14 LAB — CBC WITH DIFFERENTIAL/PLATELET
BASOS PCT: 1 %
Basophils Absolute: 0.1 10*3/uL (ref 0–0.1)
EOS ABS: 0.2 10*3/uL (ref 0–0.7)
EOS PCT: 2 %
HCT: 43.5 % (ref 35.0–47.0)
Hemoglobin: 14.7 g/dL (ref 12.0–16.0)
Lymphocytes Relative: 50 %
Lymphs Abs: 4.2 10*3/uL — ABNORMAL HIGH (ref 1.0–3.6)
MCH: 30.9 pg (ref 26.0–34.0)
MCHC: 33.8 g/dL (ref 32.0–36.0)
MCV: 91.6 fL (ref 80.0–100.0)
Monocytes Absolute: 0.9 10*3/uL (ref 0.2–0.9)
Monocytes Relative: 10 %
Neutro Abs: 3.2 10*3/uL (ref 1.4–6.5)
Neutrophils Relative %: 37 %
Platelets: 267 10*3/uL (ref 150–440)
RBC: 4.75 MIL/uL (ref 3.80–5.20)
RDW: 14.1 % (ref 11.5–14.5)
WBC: 8.5 10*3/uL (ref 3.6–11.0)

## 2015-12-14 LAB — POCT I-STAT CREATININE: CREATININE: 0.7 mg/dL (ref 0.44–1.00)

## 2015-12-14 LAB — LACTATE DEHYDROGENASE: LDH: 186 U/L (ref 98–192)

## 2015-12-14 MED ORDER — IOHEXOL 300 MG/ML  SOLN
100.0000 mL | Freq: Once | INTRAMUSCULAR | Status: AC | PRN
  Administered 2015-12-14: 100 mL via INTRAVENOUS

## 2015-12-17 ENCOUNTER — Inpatient Hospital Stay: Payer: Medicare Other | Admitting: Oncology

## 2015-12-18 ENCOUNTER — Inpatient Hospital Stay (HOSPITAL_BASED_OUTPATIENT_CLINIC_OR_DEPARTMENT_OTHER): Payer: Medicare Other | Admitting: Oncology

## 2015-12-18 VITALS — BP 145/91 | HR 76 | Temp 98.0°F | Wt 221.3 lb

## 2015-12-18 DIAGNOSIS — Z7982 Long term (current) use of aspirin: Secondary | ICD-10-CM

## 2015-12-18 DIAGNOSIS — G8929 Other chronic pain: Secondary | ICD-10-CM

## 2015-12-18 DIAGNOSIS — Z803 Family history of malignant neoplasm of breast: Secondary | ICD-10-CM

## 2015-12-18 DIAGNOSIS — C8510 Unspecified B-cell lymphoma, unspecified site: Secondary | ICD-10-CM | POA: Diagnosis not present

## 2015-12-18 DIAGNOSIS — M549 Dorsalgia, unspecified: Secondary | ICD-10-CM | POA: Diagnosis not present

## 2015-12-18 DIAGNOSIS — Z8041 Family history of malignant neoplasm of ovary: Secondary | ICD-10-CM

## 2015-12-18 DIAGNOSIS — Z90710 Acquired absence of both cervix and uterus: Secondary | ICD-10-CM

## 2015-12-18 DIAGNOSIS — E039 Hypothyroidism, unspecified: Secondary | ICD-10-CM

## 2015-12-18 DIAGNOSIS — Z807 Family history of other malignant neoplasms of lymphoid, hematopoietic and related tissues: Secondary | ICD-10-CM

## 2015-12-18 DIAGNOSIS — F419 Anxiety disorder, unspecified: Secondary | ICD-10-CM

## 2015-12-18 DIAGNOSIS — M25569 Pain in unspecified knee: Secondary | ICD-10-CM

## 2015-12-18 DIAGNOSIS — Z800 Family history of malignant neoplasm of digestive organs: Secondary | ICD-10-CM

## 2015-12-18 DIAGNOSIS — R1900 Intra-abdominal and pelvic swelling, mass and lump, unspecified site: Secondary | ICD-10-CM

## 2015-12-18 DIAGNOSIS — Z90722 Acquired absence of ovaries, bilateral: Secondary | ICD-10-CM

## 2015-12-18 DIAGNOSIS — Z79899 Other long term (current) drug therapy: Secondary | ICD-10-CM

## 2015-12-18 DIAGNOSIS — R160 Hepatomegaly, not elsewhere classified: Secondary | ICD-10-CM

## 2015-12-18 DIAGNOSIS — Z87442 Personal history of urinary calculi: Secondary | ICD-10-CM

## 2015-12-18 DIAGNOSIS — C8580 Other specified types of non-Hodgkin lymphoma, unspecified site: Secondary | ICD-10-CM

## 2015-12-18 DIAGNOSIS — Z9221 Personal history of antineoplastic chemotherapy: Secondary | ICD-10-CM

## 2015-12-18 DIAGNOSIS — K219 Gastro-esophageal reflux disease without esophagitis: Secondary | ICD-10-CM

## 2015-12-18 DIAGNOSIS — I10 Essential (primary) hypertension: Secondary | ICD-10-CM

## 2015-12-18 DIAGNOSIS — R5383 Other fatigue: Secondary | ICD-10-CM

## 2015-12-18 DIAGNOSIS — R531 Weakness: Secondary | ICD-10-CM

## 2015-12-18 DIAGNOSIS — I2510 Atherosclerotic heart disease of native coronary artery without angina pectoris: Secondary | ICD-10-CM

## 2015-12-18 DIAGNOSIS — R590 Localized enlarged lymph nodes: Secondary | ICD-10-CM

## 2015-12-19 ENCOUNTER — Ambulatory Visit: Payer: No Typology Code available for payment source | Admitting: Oncology

## 2015-12-19 NOTE — Progress Notes (Signed)
[place]  [phone] [phone]  ID: Kaitlin Thompson OB: [birth year]  MR#: [id number]  [id number]  Patient Care Team: Tracie Harrier, MD as PCP - General (Internal Medicine)  CHIEF COMPLAINT:  Chief Complaint  Patient presents with  . Lymphoma    follow up    INTERVAL HISTORY: Patient returns to clinic today for further evaluation and discussion of her imaging results. She continues to have chronic back and knee pain. She is also complaining of increased weakness and fatigue, hot flashes, and weight gain. She otherwise feels well. She has no neurologic complaints. She denies any recent fevers. She has no chest pain or shortness of breath. She has a good appetite and denies any nausea, vomiting, constipation, or diarrhea. She has no urinary complaints. Patient offers no further specific complaints today.   REVIEW OF SYSTEMS:   Review of Systems  Constitutional: Positive for malaise/fatigue. Negative for fever.  Respiratory: Negative.   Cardiovascular: Negative.   Gastrointestinal: Negative.   Musculoskeletal: Positive for back pain.  Neurological: Positive for sensory change and weakness.  Endo/Heme/Allergies: Does not bruise/bleed easily.    As per HPI. Otherwise, a complete review of systems is negatve.  PAST MEDICAL HISTORY: Past Medical History  Diagnosis Date  . Hypertension   . Kidney stones   . Thyroid disease     hypothyroidism  . GERD (gastroesophageal reflux disease)   . Anxiety   . S/P partial hysterectomy   . Non Hodgkin's lymphoma ([place])   . History of appendectomy     30 plus yrs ago    PAST SURGICAL HISTORY: Past Surgical History  Procedure Laterality Date  . Abdominal hysterectomy      FAMILY HISTORY Family History  Problem Relation Age of Onset  . Cancer Other     breast cancer, colon cancer, lymphoma, ovarian cancer  . Diabetes Other   . Anemia Other   . CAD Other   . Liver disease Other         ADVANCED DIRECTIVES:    HEALTH MAINTENANCE: Social History  Substance Use Topics  . Smoking status: Never Smoker   . Smokeless tobacco: Not on file  . Alcohol Use: Not on file     Colonoscopy:  PAP:  Bone density:  Lipid panel:  Allergies  Allergen Reactions  . Percocet [Oxycodone-Acetaminophen]   . Dicyclomine Anxiety    Tremors    Current Outpatient Prescriptions  Medication Sig Dispense Refill  . ALPRAZolam (XANAX) 0.25 MG tablet Take 0.25 mg by mouth at bedtime as needed for anxiety.    Marland Kitchen amitriptyline (ELAVIL) 25 MG tablet Take 25 mg by mouth at bedtime.    Marland Kitchen aspirin 81 MG tablet Take 81 mg by mouth daily.    Marland Kitchen atenolol (TENORMIN) 50 MG tablet Take 50 mg by mouth daily.    . fluticasone (FLONASE) 50 MCG/ACT nasal spray Place into the nose.    . levothyroxine (SYNTHROID, LEVOTHROID) 112 MCG tablet Take 112 mcg by mouth daily before breakfast.    . omeprazole (PRILOSEC) 20 MG capsule Take 20 mg by mouth daily.     No current facility-administered medications for this visit.    OBJECTIVE: Filed Vitals:   12/18/15 1026  BP: 145/91  Pulse: 76  Temp: 98 F (36.7 C)     There is no height on file to calculate BMI.    ECOG FS:0 - Asymptomatic  General: Well-developed, well-nourished, no acute distress. Eyes: anicteric sclera. Lungs: Clear to auscultation bilaterally.  Heart: Regular rate and rhythm. No rubs, murmurs, or gallops. Abdomen: Soft, nontender, nondistended. No organomegaly noted, normoactive bowel sounds. Musculoskeletal: No edema, cyanosis, or clubbing. Neuro: Alert, answering all questions appropriately. Cranial nerves grossly intact. Skin: No rashes or petechiae noted. Psych: Normal affect. Lymphatics: No cervical, calvicular, axillary or inguinal LAD.   LAB RESULTS:  Lab Results  Component Value Date   NA 139 01/08/2015   K 4.3 01/08/2015   CL 107 01/08/2015   CO2 26 01/08/2015   GLUCOSE 96 01/08/2015   BUN 11 01/08/2015    CREATININE 0.70 12/14/2015   CALCIUM 9.3 01/08/2015   PROT 7.0 07/11/2014   ALBUMIN 3.6 07/11/2014   AST 23 07/11/2014   ALT 25 07/11/2014   ALKPHOS 84 07/11/2014   BILITOT 0.4 07/11/2014   GFRNONAA >60 01/08/2015   GFRAA >60 01/08/2015    Lab Results  Component Value Date   WBC 8.5 12/14/2015   NEUTROABS 3.2 12/14/2015   HGB 14.7 12/14/2015   HCT 43.5 12/14/2015   MCV 91.6 12/14/2015   PLT 267 12/14/2015     STUDIES: Ct Chest W Contrast  12/14/2015  CLINICAL DATA:  Restaging of marginal zone lymphoma. Treated with chemotherapy in April of 2015. EXAM: CT CHEST, ABDOMEN, AND PELVIS WITH CONTRAST TECHNIQUE: Multidetector CT imaging of the chest, abdomen and pelvis was performed following the standard protocol during bolus administration of intravenous contrast. CONTRAST:  1100mL OMNIPAQUE IOHEXOL 300 MG/ML  SOLN COMPARISON:  08/08/2015 abdominal pelvic CT. Chest abdomen and pelvic CTs of 06/06/2015. FINDINGS: CT CHEST Mediastinum/Nodes: Tortuous descending thoracic aorta. Aortic and branch vessel atherosclerosis. Normal heart size, without pericardial effusion. Lad calcified atherosclerosis. No central pulmonary embolism, on this non-dedicated study. No middle mediastinal or hilar adenopathy. A periesophageal node is mildly enlarged at 9 mm today versus 8 mm on the prior. Image 39/series 2 today. No internal mammary adenopathy. Lungs/Pleura: No pleural fluid. Musculoskeletal: Bilateral axillary adenopathy. A right axillary node measures 1.3 cm on image 11/series 2 versus 1.0 cm on the prior exam. An adjacent more inferior 13 mm node on image 14/series 2 measured 11 mm on the prior. Index left axillary node measures 1.1 x 2.1 cm on image 10/series 2. 1.3 x 2.1 cm on the prior exam. No acute osseous abnormality. CT ABDOMEN AND PELVIS Hepatobiliary: Hepatomegaly, nearly 21 cm craniocaudal. Normal gallbladder, without biliary ductal dilatation. Pancreas: Normal, without mass or ductal dilatation.  Spleen: Normal in size, without focal abnormality. Adrenals/Urinary Tract: Bilateral adrenal nodularity. Right-sided on the order of 10 mm and similar. Left-sided on the order of 13 mm and similar. Punctate right renal collecting system calculus. Bilateral too small to characterize renal lesions. A lower pole right renal lesion measures approximately 9 mm and is likely a cyst. No hydronephrosis. Normal urinary bladder. Stomach/Bowel: Normal stomach, without wall thickening. Scattered colonic diverticula. Normal terminal ileum. Normal small bowel. Vascular/Lymphatic: Aortic and branch vessel atherosclerosis. Small retroperitoneal nodes are similar and not pathologic by size criteria. Left external iliac nodal mass measures 2.3 x 3.9 cm on image 106/series 2. Compare 2.3 x 3.8 cm on the prior exam (when remeasured). Left inguinal node measures 1.2 cm on image 117/series 2. 1.1 cm on the prior. Left common iliac node measures 8 mm today and is unchanged. Reproductive: Normal left ovary/adnexa. A soft tissue density within the right hemipelvis is favored to arise from the right ovary. 3.9 x 3.4 cm on image 99/series 2. Similar to on the 06/06/2015 exam (when remeasured). Other: No significant  free fluid. No evidence of omental or peritoneal disease. Musculoskeletal: Bilateral L5 pars defects. Grade 1 L5-S1 anterolisthesis. IMPRESSION: CT CHEST IMPRESSION 1. Slight progression of thoracic adenopathy. 2. Coronary artery atherosclerosis. CT ABDOMEN AND PELVIS IMPRESSION 1. Similar pelvic adenopathy. 2. Hepatomegaly. 3. Right nephrolithiasis. 4. Similar right pelvic mass, favored to be ovarian in origin. 5. Similar bilateral adrenal nodularity. Electronically Signed   By: Abigail Miyamoto M.D.   On: 12/14/2015 11:05   Ct Abdomen Pelvis W Contrast  12/14/2015  CLINICAL DATA:  Restaging of marginal zone lymphoma. Treated with chemotherapy in April of 2015. EXAM: CT CHEST, ABDOMEN, AND PELVIS WITH CONTRAST TECHNIQUE:  Multidetector CT imaging of the chest, abdomen and pelvis was performed following the standard protocol during bolus administration of intravenous contrast. CONTRAST:  127mL OMNIPAQUE IOHEXOL 300 MG/ML  SOLN COMPARISON:  08/08/2015 abdominal pelvic CT. Chest abdomen and pelvic CTs of 06/06/2015. FINDINGS: CT CHEST Mediastinum/Nodes: Tortuous descending thoracic aorta. Aortic and branch vessel atherosclerosis. Normal heart size, without pericardial effusion. Lad calcified atherosclerosis. No central pulmonary embolism, on this non-dedicated study. No middle mediastinal or hilar adenopathy. A periesophageal node is mildly enlarged at 9 mm today versus 8 mm on the prior. Image 39/series 2 today. No internal mammary adenopathy. Lungs/Pleura: No pleural fluid. Musculoskeletal: Bilateral axillary adenopathy. A right axillary node measures 1.3 cm on image 11/series 2 versus 1.0 cm on the prior exam. An adjacent more inferior 13 mm node on image 14/series 2 measured 11 mm on the prior. Index left axillary node measures 1.1 x 2.1 cm on image 10/series 2. 1.3 x 2.1 cm on the prior exam. No acute osseous abnormality. CT ABDOMEN AND PELVIS Hepatobiliary: Hepatomegaly, nearly 21 cm craniocaudal. Normal gallbladder, without biliary ductal dilatation. Pancreas: Normal, without mass or ductal dilatation. Spleen: Normal in size, without focal abnormality. Adrenals/Urinary Tract: Bilateral adrenal nodularity. Right-sided on the order of 10 mm and similar. Left-sided on the order of 13 mm and similar. Punctate right renal collecting system calculus. Bilateral too small to characterize renal lesions. A lower pole right renal lesion measures approximately 9 mm and is likely a cyst. No hydronephrosis. Normal urinary bladder. Stomach/Bowel: Normal stomach, without wall thickening. Scattered colonic diverticula. Normal terminal ileum. Normal small bowel. Vascular/Lymphatic: Aortic and branch vessel atherosclerosis. Small retroperitoneal  nodes are similar and not pathologic by size criteria. Left external iliac nodal mass measures 2.3 x 3.9 cm on image 106/series 2. Compare 2.3 x 3.8 cm on the prior exam (when remeasured). Left inguinal node measures 1.2 cm on image 117/series 2. 1.1 cm on the prior. Left common iliac node measures 8 mm today and is unchanged. Reproductive: Normal left ovary/adnexa. A soft tissue density within the right hemipelvis is favored to arise from the right ovary. 3.9 x 3.4 cm on image 99/series 2. Similar to on the 06/06/2015 exam (when remeasured). Other: No significant free fluid. No evidence of omental or peritoneal disease. Musculoskeletal: Bilateral L5 pars defects. Grade 1 L5-S1 anterolisthesis. IMPRESSION: CT CHEST IMPRESSION 1. Slight progression of thoracic adenopathy. 2. Coronary artery atherosclerosis. CT ABDOMEN AND PELVIS IMPRESSION 1. Similar pelvic adenopathy. 2. Hepatomegaly. 3. Right nephrolithiasis. 4. Similar right pelvic mass, favored to be ovarian in origin. 5. Similar bilateral adrenal nodularity. Electronically Signed   By: Abigail Miyamoto M.D.   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 Lymphoma: Left axillary lymph node biopsy performed on May 05, 2014  confirmed B cell, marginal zone lymphoma diagnosis.  CT scan results as above with slight progression of thoracic adenopathy. Pelvic adenopathy is unchanged. It is unlikely patient's symptoms are related to underlying marginal zone lymphoma, but treatment was discussed. Previously patient was adamant about no treatment, but now has reconsidered. She does not want treatment at this time. She also would like to switch oncologists therefore will return to clinic in 1 month for further evaluation.  2. Back pain/knee pain: Treatment per orthopedics. 3. Weakness, fatigue, hot flashes: Unlikely related to underlying lymphoma. Patient has been instructed to keep her  previously scheduled follow-up appointment with primary care for further evaluation. 4. Right pelvic mass: Unchanged. Possibly ovarian in origin, likely benign.   Patient expressed understanding and was in agreement with this plan. She also understands that She can call clinic at any time with any questions, concerns, or complaints.    Lloyd Huger, MD   12/19/2015 12:44 PM

## 2015-12-20 ENCOUNTER — Ambulatory Visit
Admission: RE | Admit: 2015-12-20 | Discharge: 2015-12-20 | Disposition: A | Discharge status: Home / Self Care | Payer: Medicare Other | Source: Ambulatory Visit | Attending: Gastroenterology | Admitting: Gastroenterology

## 2015-12-20 ENCOUNTER — Encounter: Payer: Self-pay | Admitting: *Deleted

## 2015-12-20 ENCOUNTER — Encounter
Admission: RE | Disposition: A | Discharge status: Home / Self Care | Payer: Self-pay | Source: Ambulatory Visit | Attending: Gastroenterology

## 2015-12-20 ENCOUNTER — Ambulatory Visit: Payer: Medicare Other | Admitting: Anesthesiology

## 2015-12-20 DIAGNOSIS — Z833 Family history of diabetes mellitus: Secondary | ICD-10-CM | POA: Diagnosis not present

## 2015-12-20 DIAGNOSIS — K5730 Diverticulosis of large intestine without perforation or abscess without bleeding: Secondary | ICD-10-CM | POA: Diagnosis not present

## 2015-12-20 DIAGNOSIS — E039 Hypothyroidism, unspecified: Secondary | ICD-10-CM | POA: Insufficient documentation

## 2015-12-20 DIAGNOSIS — Z888 Allergy status to other drugs, medicaments and biological substances status: Secondary | ICD-10-CM | POA: Diagnosis not present

## 2015-12-20 DIAGNOSIS — I10 Essential (primary) hypertension: Secondary | ICD-10-CM | POA: Diagnosis not present

## 2015-12-20 DIAGNOSIS — Z7982 Long term (current) use of aspirin: Secondary | ICD-10-CM | POA: Diagnosis not present

## 2015-12-20 DIAGNOSIS — Z800 Family history of malignant neoplasm of digestive organs: Secondary | ICD-10-CM | POA: Insufficient documentation

## 2015-12-20 DIAGNOSIS — Z832 Family history of diseases of the blood and blood-forming organs and certain disorders involving the immune mechanism: Secondary | ICD-10-CM | POA: Diagnosis not present

## 2015-12-20 DIAGNOSIS — K317 Polyp of stomach and duodenum: Secondary | ICD-10-CM | POA: Insufficient documentation

## 2015-12-20 DIAGNOSIS — Z90710 Acquired absence of both cervix and uterus: Secondary | ICD-10-CM | POA: Diagnosis not present

## 2015-12-20 DIAGNOSIS — R1310 Dysphagia, unspecified: Secondary | ICD-10-CM | POA: Insufficient documentation

## 2015-12-20 DIAGNOSIS — Z8041 Family history of malignant neoplasm of ovary: Secondary | ICD-10-CM | POA: Diagnosis not present

## 2015-12-20 DIAGNOSIS — C8590 Non-Hodgkin lymphoma, unspecified, unspecified site: Secondary | ICD-10-CM | POA: Insufficient documentation

## 2015-12-20 DIAGNOSIS — R1084 Generalized abdominal pain: Secondary | ICD-10-CM | POA: Diagnosis present

## 2015-12-20 DIAGNOSIS — D122 Benign neoplasm of ascending colon: Secondary | ICD-10-CM | POA: Insufficient documentation

## 2015-12-20 DIAGNOSIS — M5124 Other intervertebral disc displacement, thoracic region: Secondary | ICD-10-CM | POA: Diagnosis not present

## 2015-12-20 DIAGNOSIS — M5136 Other intervertebral disc degeneration, lumbar region: Secondary | ICD-10-CM | POA: Insufficient documentation

## 2015-12-20 DIAGNOSIS — M1990 Unspecified osteoarthritis, unspecified site: Secondary | ICD-10-CM | POA: Diagnosis not present

## 2015-12-20 DIAGNOSIS — F419 Anxiety disorder, unspecified: Secondary | ICD-10-CM | POA: Diagnosis not present

## 2015-12-20 DIAGNOSIS — M5030 Other cervical disc degeneration, unspecified cervical region: Secondary | ICD-10-CM | POA: Diagnosis not present

## 2015-12-20 DIAGNOSIS — Z87891 Personal history of nicotine dependence: Secondary | ICD-10-CM | POA: Diagnosis not present

## 2015-12-20 DIAGNOSIS — G43909 Migraine, unspecified, not intractable, without status migrainosus: Secondary | ICD-10-CM | POA: Insufficient documentation

## 2015-12-20 DIAGNOSIS — Z86010 Personal history of colonic polyps: Secondary | ICD-10-CM | POA: Insufficient documentation

## 2015-12-20 DIAGNOSIS — Z87442 Personal history of urinary calculi: Secondary | ICD-10-CM | POA: Diagnosis not present

## 2015-12-20 DIAGNOSIS — Z79899 Other long term (current) drug therapy: Secondary | ICD-10-CM | POA: Insufficient documentation

## 2015-12-20 DIAGNOSIS — Z803 Family history of malignant neoplasm of breast: Secondary | ICD-10-CM | POA: Insufficient documentation

## 2015-12-20 DIAGNOSIS — Z8371 Family history of colonic polyps: Secondary | ICD-10-CM | POA: Insufficient documentation

## 2015-12-20 DIAGNOSIS — K219 Gastro-esophageal reflux disease without esophagitis: Secondary | ICD-10-CM | POA: Diagnosis not present

## 2015-12-20 DIAGNOSIS — Z807 Family history of other malignant neoplasms of lymphoid, hematopoietic and related tissues: Secondary | ICD-10-CM | POA: Diagnosis not present

## 2015-12-20 DIAGNOSIS — Z885 Allergy status to narcotic agent status: Secondary | ICD-10-CM | POA: Insufficient documentation

## 2015-12-20 DIAGNOSIS — Z823 Family history of stroke: Secondary | ICD-10-CM | POA: Insufficient documentation

## 2015-12-20 HISTORY — PX: COLONOSCOPY WITH PROPOFOL: SHX5780

## 2015-12-20 HISTORY — PX: ESOPHAGOGASTRODUODENOSCOPY (EGD) WITH PROPOFOL: SHX5813

## 2015-12-20 SURGERY — COLONOSCOPY WITH PROPOFOL
Anesthesia: General

## 2015-12-20 MED ORDER — PROPOFOL 10 MG/ML IV BOLUS
INTRAVENOUS | Status: DC | PRN
  Administered 2015-12-20: 50 mg via INTRAVENOUS

## 2015-12-20 MED ORDER — SODIUM CHLORIDE 0.9 % IV SOLN
INTRAVENOUS | Status: DC
  Administered 2015-12-20: 08:00:00 via INTRAVENOUS

## 2015-12-20 MED ORDER — MIDAZOLAM HCL 2 MG/2ML IJ SOLN
INTRAMUSCULAR | Status: DC | PRN
Start: 2015-12-20 — End: 2015-12-20
  Administered 2015-12-20: 2 mg via INTRAVENOUS

## 2015-12-20 MED ORDER — LIDOCAINE HCL (CARDIAC) 20 MG/ML IV SOLN
INTRAVENOUS | Status: DC | PRN
  Administered 2015-12-20: 60 mg via INTRAVENOUS

## 2015-12-20 MED ORDER — SODIUM CHLORIDE 0.9 % IV SOLN: INTRAVENOUS | Status: DC

## 2015-12-20 MED ORDER — PROPOFOL 500 MG/50ML IV EMUL
INTRAVENOUS | Status: DC | PRN
  Administered 2015-12-20: 100 ug/kg/min via INTRAVENOUS

## 2015-12-20 NOTE — Transfer of Care (Signed)
Immediate Anesthesia Transfer of Care Note  Patient: RABIYA CHICOINE  Procedure(s) Performed: Procedure(s): COLONOSCOPY WITH PROPOFOL (N/A) ESOPHAGOGASTRODUODENOSCOPY (EGD) WITH PROPOFOL (N/A)  Patient Location: Endoscopy Unit  Anesthesia Type:General  Level of Consciousness: awake, alert , oriented and patient cooperative  Airway & Oxygen Therapy: Patient Spontanous Breathing and Patient connected to nasal cannula oxygen  Post-op Assessment: Report given to RN, Post -op Vital signs reviewed and stable and Patient moving all extremities X 4  Post vital signs: Reviewed and stable  Last Vitals:  Filed Vitals:   12/20/15 0759  BP: 137/89  Pulse: 98  Temp: 36.7 C  Resp: 20    Complications: No apparent anesthesia complications

## 2015-12-20 NOTE — Anesthesia Preprocedure Evaluation (Signed)
Anesthesia Evaluation  Patient identified by MRN, date of birth, ID band Patient awake    Reviewed: Allergy & Precautions, H&P , NPO status , Patient's Chart, lab work & pertinent test results, reviewed documented beta blocker date and time   Airway Mallampati: II  TM Distance: >3 FB Neck ROM: full    Dental no notable dental hx. (+) Teeth Intact   Pulmonary neg pulmonary ROS, former smoker,    Pulmonary exam normal breath sounds clear to auscultation       Cardiovascular Exercise Tolerance: Good hypertension, negative cardio ROS   Rhythm:regular Rate:Normal     Neuro/Psych negative neurological ROS  negative psych ROS   GI/Hepatic negative GI ROS, Neg liver ROS, GERD  ,  Endo/Other  negative endocrine ROS  Renal/GU negative Renal ROS  negative genitourinary   Musculoskeletal   Abdominal   Peds  Hematology negative hematology ROS (+)   Anesthesia Other Findings   Reproductive/Obstetrics negative OB ROS                             Anesthesia Physical Anesthesia Plan  ASA: III  Anesthesia Plan: General   Post-op Pain Management:    Induction:   Airway Management Planned:   Additional Equipment:   Intra-op Plan:   Post-operative Plan:   Informed Consent: I have reviewed the patients History and Physical, chart, labs and discussed the procedure including the risks, benefits and alternatives for the proposed anesthesia with the patient or authorized representative who has indicated his/her understanding and acceptance.   Dental Advisory Given  Plan Discussed with: CRNA  Anesthesia Plan Comments:         Anesthesia Quick Evaluation

## 2015-12-20 NOTE — H&P (Signed)
  Date of Initial H&P:11/19/2015  History reviewed, patient examined, no change in status, stable for surgery.

## 2015-12-20 NOTE — Op Note (Signed)
[facility] Patient Name: Kaitlin Thompson Procedure Date: 12/20/2015 9:15 AM MRN: [id number] Account #: [id number] Date of Birth: 05-Sep-1951 Admit Type: Outpatient Age: 65 Room: [facility] ENDO ROOM 4 Gender: Female Note Status: Finalized Procedure:         Colonoscopy Indications:       Generalized abdominal pain, Personal history of colonic                     polyps Providers:         Lupita Dawn. Candace Cruise, MD Referring MD:      Tracie Harrier, MD (Referring MD) Medicines:         Monitored Anesthesia Care Complications:     No immediate complications. Procedure:         Pre-Anesthesia Assessment:                    - Prior to the procedure, a History and Physical was                     performed, and patient medications, allergies and                     sensitivities were reviewed. The patient's tolerance of                     previous anesthesia was reviewed.                    - The risks and benefits of the procedure and the sedation                     options and risks were discussed with the patient. All                     questions were answered and informed consent was obtained.                    - After reviewing the risks and benefits, the patient was                     deemed in satisfactory condition to undergo the procedure.                    After obtaining informed consent, the colonoscope was                     passed under direct vision. Throughout the procedure, the                     patient's blood pressure, pulse, and oxygen saturations                     were monitored continuously. The Colonoscope was                     introduced through the anus and advanced to the the cecum,                     identified by appendiceal orifice and ileocecal valve. The                     colonoscopy was performed without difficulty. The patient  tolerated the procedure well. The quality of the bowel   preparation was good. Findings:      A few small and large-mouthed diverticula were found in the sigmoid       colon. Biopsies for histology were taken with a cold forceps from the       entire colon for evaluation of microscopic colitis.      A small polyp was found in the ascending colon. The polyp was sessile.       The polyp was removed with a cold snare. Resection and retrieval were       complete.      The exam was otherwise without abnormality. Impression:        - Diverticulosis in the sigmoid colon. Biopsied.                    - One small polyp in the ascending colon. Resected and                     retrieved.                    - The examination was otherwise normal. Recommendation:    - Discharge patient to home.                    - Await pathology results.                    - Repeat colonoscopy in 5 years for surveillance based on                     pathology results.                    - The findings and recommendations were discussed with the                     patient. Procedure Code(s): --- Professional ---                    [phone], Colonoscopy, flexible; with removal of tumor(s),                     polyp(s), or other lesion(s) by snare technique                    45380, 14, Colonoscopy, flexible; with biopsy, single or                     multiple Diagnosis Code(s): --- Professional ---                    D12.2, Benign neoplasm of ascending colon                    R10.84, Generalized abdominal pain                    Z86.010, Personal history of colonic polyps                    K57.30, Diverticulosis of large intestine without                     perforation or abscess without bleeding CPT copyright 2014 American Medical Association. All rights reserved. The codes documented in this report are preliminary and upon coder review may  be revised to meet current  compliance requirements. Hulen Luster, MD 12/20/2015 9:50:51 AM This report has been signed  electronically. Number of Addenda: 0 Note Initiated On: 12/20/2015 9:15 AM Scope Withdrawal Time: 0 hours 7 minutes 40 seconds  Total Procedure Duration: 0 hours 12 minutes 0 seconds       [facility]

## 2015-12-20 NOTE — Op Note (Signed)
[facility] Patient Name: Kaitlin Thompson Procedure Date: 12/20/2015 9:15 AM MRN: [id number] Account #: [id number] Date of Birth: Jan 05, 1951 Admit Type: Outpatient Age: 65 Room: [facility] ENDO ROOM 4 Gender: Female Note Status: Finalized Procedure:         Upper GI endoscopy Indications:       Dysphagia, Suspected esophageal reflux Providers:         Lupita Dawn. Candace Cruise, MD Referring MD:      Tracie Harrier, MD (Referring MD) Medicines:         Monitored Anesthesia Care Complications:     No immediate complications. Procedure:         Pre-Anesthesia Assessment:                    - Prior to the procedure, a History and Physical was                     performed, and patient medications, allergies and                     sensitivities were reviewed. The patient's tolerance of                     previous anesthesia was reviewed.                    - The risks and benefits of the procedure and the sedation                     options and risks were discussed with the patient. All                     questions were answered and informed consent was obtained.                    - After reviewing the risks and benefits, the patient was                     deemed in satisfactory condition to undergo the procedure.                    After obtaining informed consent, the endoscope was passed                     under direct vision. Throughout the procedure, the                     patient's blood pressure, pulse, and oxygen saturations                     were monitored continuously. The Endoscope was introduced                     through the mouth, and advanced to the second part of                     duodenum. The upper GI endoscopy was accomplished without                     difficulty. The patient tolerated the procedure well. Findings:      No endoscopic abnormality was evident in the esophagus to explain the       patient's complaint of dysphagia. It was  decided, however, to proceed  with dilation of the entire esophagus. The scope was withdrawn. Dilation       was performed with a Maloney dilator with no resistance at 12 Fr.      Two small sessile polyps were found in the stomach. Biopsies were taken       with a cold forceps for histology.      The exam was otherwise without abnormality.      The examined duodenum was normal. Impression:        - No endoscopic esophageal abnormality to explain                     patient's dysphagia. Esophagus dilated. Dilated.                    - Two gastric polyps. Biopsied.                    - The examination was otherwise normal.                    - Normal examined duodenum. Recommendation:    - Discharge patient to home.                    - Observe patient's clinical course.                    - Await pathology results.                    - The findings and recommendations were discussed with the                     patient.                    - If dysphagia persists, then schedule esophageal                     manometry.                    If throat continues to bother, ENT evaluation Procedure Code(s): --- Professional ---                    [phone], Esophagogastroduodenoscopy, flexible, transoral;                     with biopsy, single or multiple                    43450, Dilation of esophagus, by unguided sound or bougie,                     single or multiple passes Diagnosis Code(s): --- Professional ---                    R13.10, Dysphagia, unspecified                    K31.7, Polyp of stomach and duodenum CPT copyright 2014 American Medical Association. All rights reserved. The codes documented in this report are preliminary and upon coder review may  be revised to meet current compliance requirements. Hulen Luster, MD 12/20/2015 9:26:46 AM This report has been signed electronically. Number of Addenda: 0 Note Initiated On: 12/20/2015 9:15 AM      [facility]

## 2015-12-20 NOTE — Anesthesia Postprocedure Evaluation (Signed)
Anesthesia Post Note  Patient: Kaitlin Thompson  Procedure(s) Performed: Procedure(s) (LRB): COLONOSCOPY WITH PROPOFOL (N/A) ESOPHAGOGASTRODUODENOSCOPY (EGD) WITH PROPOFOL (N/A)  Patient location during evaluation: PACU Anesthesia Type: General Level of consciousness: awake and alert Pain management: pain level controlled Vital Signs Assessment: post-procedure vital signs reviewed and stable Respiratory status: spontaneous breathing, nonlabored ventilation, respiratory function stable and patient connected to nasal cannula oxygen Cardiovascular status: blood pressure returned to baseline and stable Postop Assessment: no signs of nausea or vomiting Anesthetic complications: no    Last Vitals:  Filed Vitals:   12/20/15 1019 12/20/15 1029  BP:  131/82  Pulse: 72   Temp:    Resp: 22     Last Pain: There were no vitals filed for this visit.               Molli Barrows

## 2015-12-21 LAB — SURGICAL PATHOLOGY

## 2016-01-21 ENCOUNTER — Ambulatory Visit: Payer: No Typology Code available for payment source | Admitting: Internal Medicine

## 2016-01-28 ENCOUNTER — Encounter
Admission: RE | Admit: 2016-01-28 | Discharge: 2016-01-28 | Disposition: A | Discharge status: Home / Self Care | Payer: Medicare Other | Source: Ambulatory Visit | Attending: Obstetrics and Gynecology | Admitting: Obstetrics and Gynecology

## 2016-01-28 ENCOUNTER — Inpatient Hospital Stay: Payer: Medicare Other

## 2016-01-28 ENCOUNTER — Encounter: Payer: Self-pay | Admitting: Oncology

## 2016-01-28 ENCOUNTER — Inpatient Hospital Stay: Payer: Medicare Other | Attending: Oncology | Admitting: Oncology

## 2016-01-28 VITALS — BP 156/92 | HR 73 | Temp 97.6°F | Resp 18 | Wt 222.0 lb

## 2016-01-28 DIAGNOSIS — Z01812 Encounter for preprocedural laboratory examination: Secondary | ICD-10-CM | POA: Diagnosis present

## 2016-01-28 DIAGNOSIS — M549 Dorsalgia, unspecified: Secondary | ICD-10-CM | POA: Diagnosis not present

## 2016-01-28 DIAGNOSIS — Z01810 Encounter for preprocedural cardiovascular examination: Secondary | ICD-10-CM | POA: Insufficient documentation

## 2016-01-28 DIAGNOSIS — Z803 Family history of malignant neoplasm of breast: Secondary | ICD-10-CM | POA: Diagnosis not present

## 2016-01-28 DIAGNOSIS — Z87442 Personal history of urinary calculi: Secondary | ICD-10-CM | POA: Diagnosis not present

## 2016-01-28 DIAGNOSIS — N838 Other noninflammatory disorders of ovary, fallopian tube and broad ligament: Secondary | ICD-10-CM

## 2016-01-28 DIAGNOSIS — R599 Enlarged lymph nodes, unspecified: Secondary | ICD-10-CM | POA: Insufficient documentation

## 2016-01-28 DIAGNOSIS — K219 Gastro-esophageal reflux disease without esophagitis: Secondary | ICD-10-CM | POA: Insufficient documentation

## 2016-01-28 DIAGNOSIS — Z79899 Other long term (current) drug therapy: Secondary | ICD-10-CM | POA: Diagnosis not present

## 2016-01-28 DIAGNOSIS — Z87891 Personal history of nicotine dependence: Secondary | ICD-10-CM | POA: Diagnosis not present

## 2016-01-28 DIAGNOSIS — Z7982 Long term (current) use of aspirin: Secondary | ICD-10-CM | POA: Diagnosis not present

## 2016-01-28 DIAGNOSIS — Z8041 Family history of malignant neoplasm of ovary: Secondary | ICD-10-CM

## 2016-01-28 DIAGNOSIS — C8510 Unspecified B-cell lymphoma, unspecified site: Secondary | ICD-10-CM | POA: Diagnosis not present

## 2016-01-28 DIAGNOSIS — R1909 Other intra-abdominal and pelvic swelling, mass and lump: Secondary | ICD-10-CM | POA: Diagnosis not present

## 2016-01-28 DIAGNOSIS — E039 Hypothyroidism, unspecified: Secondary | ICD-10-CM | POA: Diagnosis not present

## 2016-01-28 DIAGNOSIS — Z800 Family history of malignant neoplasm of digestive organs: Secondary | ICD-10-CM | POA: Diagnosis not present

## 2016-01-28 DIAGNOSIS — Z807 Family history of other malignant neoplasms of lymphoid, hematopoietic and related tissues: Secondary | ICD-10-CM | POA: Diagnosis not present

## 2016-01-28 DIAGNOSIS — G43909 Migraine, unspecified, not intractable, without status migrainosus: Secondary | ICD-10-CM | POA: Insufficient documentation

## 2016-01-28 DIAGNOSIS — R531 Weakness: Secondary | ICD-10-CM | POA: Diagnosis not present

## 2016-01-28 DIAGNOSIS — F419 Anxiety disorder, unspecified: Secondary | ICD-10-CM | POA: Diagnosis not present

## 2016-01-28 DIAGNOSIS — R5383 Other fatigue: Secondary | ICD-10-CM | POA: Insufficient documentation

## 2016-01-28 DIAGNOSIS — I10 Essential (primary) hypertension: Secondary | ICD-10-CM | POA: Diagnosis not present

## 2016-01-28 DIAGNOSIS — R61 Generalized hyperhidrosis: Secondary | ICD-10-CM | POA: Diagnosis not present

## 2016-01-28 DIAGNOSIS — M1990 Unspecified osteoarthritis, unspecified site: Secondary | ICD-10-CM | POA: Insufficient documentation

## 2016-01-28 DIAGNOSIS — R591 Generalized enlarged lymph nodes: Secondary | ICD-10-CM | POA: Insufficient documentation

## 2016-01-28 DIAGNOSIS — K5790 Diverticulosis of intestine, part unspecified, without perforation or abscess without bleeding: Secondary | ICD-10-CM | POA: Insufficient documentation

## 2016-01-28 HISTORY — DX: Hypothyroidism, unspecified: E03.9

## 2016-01-28 LAB — CBC
HCT: 42.7 % (ref 35.0–47.0)
Hemoglobin: 14.3 g/dL (ref 12.0–16.0)
MCH: 30.8 pg (ref 26.0–34.0)
MCHC: 33.5 g/dL (ref 32.0–36.0)
MCV: 91.9 fL (ref 80.0–100.0)
PLATELETS: 262 10*3/uL (ref 150–440)
RBC: 4.65 MIL/uL (ref 3.80–5.20)
RDW: 13.9 % (ref 11.5–14.5)
WBC: 8.3 10*3/uL (ref 3.6–11.0)

## 2016-01-28 LAB — TYPE AND SCREEN
ABO/RH(D): A POS
Antibody Screen: NEGATIVE

## 2016-01-28 LAB — BASIC METABOLIC PANEL
ANION GAP: 4 — AB (ref 5–15)
BUN: 14 mg/dL (ref 6–20)
CALCIUM: 9.4 mg/dL (ref 8.9–10.3)
CO2: 31 mmol/L (ref 22–32)
CREATININE: 0.61 mg/dL (ref 0.44–1.00)
Chloride: 105 mmol/L (ref 101–111)
GFR calc Af Amer: >60 mL/min (ref >60)
GLUCOSE: 116 mg/dL — AB (ref 65–99)
Potassium: 4.4 mmol/L (ref 3.5–5.1)
Sodium: 140 mmol/L (ref 135–145)

## 2016-01-28 LAB — ABO/RH: ABO/RH(D): A POS

## 2016-01-28 NOTE — Progress Notes (Signed)
Patient here as new evaluation for Dr. Oliva Thompson regarding marginal zone lymphoma.  Former patient of Dr. Grayland Thompson. C/o fatigue and sweating.  States sweats all the time,  day and night.  Has not slept well for years.

## 2016-01-28 NOTE — Pre-Procedure Instructions (Signed)
Lab called the Hemogram tube clotted and needs to be redrawn.  Pt called on cell phone and asked to come by PAT for a redraw.  Pt will stop by either before or after her appt at the cancer center.

## 2016-01-28 NOTE — Progress Notes (Signed)
[place]  [phone] [phone]  ID: Kaitlin Thompson OB: November 09, 1951  MR#: [id number]  [id number]  Patient Care Team: Tracie Harrier, MD as PCP - General (Internal Medicine)  1.  Oncology history Diagnosis of   low-grade B-cell lymphoma marginal zone.  From the left axillary lymph node biopsy in May of 2016 2.  Progressive disease with enlargement of lymphadenopathy on a subsequent CT scan 3.  Right adnexal mass and followed by Dr. Leafy Ro and possibility of oophorectomy has been planned on March 3 of 2017  Interval history 65 year old lady who switched oncologist from Dr. Grayland Ormond to me came today further follow-up.  Complaining of night sweats feeling extremely weak and tired.  The patient had a second sequential CT scan showing progressive marginal zone lymphoma  Patient also has a right adnexal mass which is being followed by a gynecologist and we do not have any CA-125 done According to her Patient ultrasound shows progressive enlargement of the right adnexal mass and bilateral oophorectomy has been planned on third of March Patient is here because of persistent back pain night sweats feeling weak and tired somewhat depressed   REVIEW OF SYSTEMS:   Review of Systems  Constitutional: Positive for malaise/fatigue. Negative for fever.  Respiratory: Negative.   Cardiovascular: Negative.   Gastrointestinal: Negative.   Musculoskeletal: Positive for back pain.  Neurological: Positive for sensory change and weakness.  Endo/Heme/Allergies: Does not bruise/bleed easily.   patient has right adnexal mass is described about. Increasing back pain.  Poor appetite feeling weak and tired No swelling of lower extremity and neurologically no major changes  As per HPI. Otherwise, a complete review of systems is negatve.  PAST MEDICAL HISTORY: Past Medical History  Diagnosis Date  . Hypertension   . Thyroid disease     hypothyroidism  .  GERD (gastroesophageal reflux disease)   . Anxiety   . S/P partial hysterectomy   . History of appendectomy     30 plus yrs ago  . Kidney stones   . Non Hodgkin's lymphoma ([facility]) 2015  . Hypothyroidism     PAST SURGICAL HISTORY: Past Surgical History  Procedure Laterality Date  . Abdominal hysterectomy    . Carpal tunnel release    . Cervical spine surgery    . Colonoscopy with propofol N/A 12/20/2015    Procedure: COLONOSCOPY WITH PROPOFOL;  Surgeon: Hulen Luster, MD;  Location: [facility] ENDOSCOPY;  Service: Gastroenterology;  Laterality: N/A;  . Esophagogastroduodenoscopy (egd) with propofol N/A 12/20/2015    Procedure: ESOPHAGOGASTRODUODENOSCOPY (EGD) WITH PROPOFOL;  Surgeon: Hulen Luster, MD;  Location: [facility] ENDOSCOPY;  Service: Gastroenterology;  Laterality: N/A;  . Appendectomy    . Joint replacement Left 01/2015    knee  . Tonsillectomy    . Axillary lymph node biopsy Left 2015    FAMILY HISTORY Family History  Problem Relation Age of Onset  . Cancer Other     breast cancer, colon cancer, lymphoma, ovarian cancer  . Diabetes Other   . Anemia Other   . CAD Other   . Liver disease Other        ADVANCED DIRECTIVES:    HEALTH MAINTENANCE: Social History  Substance Use Topics  . Smoking status: Former Smoker    Quit date: 01/27/1994  . Smokeless tobacco: Never Used  . Alcohol Use: No     Colonoscopy:  PAP:  Bone density:  Lipid panel:  Allergies  Allergen Reactions  . Diphenhydramine-Acetaminophen Other (See Comments)  .  Metoclopramide Other (See Comments)    Tremors  . Percocet [Oxycodone-Acetaminophen]   . Dicyclomine Anxiety    Tremors    Current Outpatient Prescriptions  Medication Sig Dispense Refill  . ALPRAZolam (XANAX) 0.25 MG tablet Take 0.25 mg by mouth at bedtime as needed for anxiety.    Marland Kitchen amitriptyline (ELAVIL) 25 MG tablet Take 25 mg by mouth as needed (for head aches).     Marland Kitchen aspirin 81 MG tablet Take 81 mg by mouth every morning.     Marland Kitchen  atenolol (TENORMIN) 50 MG tablet Take 50 mg by mouth every morning.     . fluticasone (FLONASE) 50 MCG/ACT nasal spray Place into the nose as needed.     Marland Kitchen levothyroxine (SYNTHROID, LEVOTHROID) 112 MCG tablet Take 112 mcg by mouth daily before breakfast.    . omeprazole (PRILOSEC) 20 MG capsule Take 20 mg by mouth every morning.      No current facility-administered medications for this visit.    OBJECTIVE: Filed Vitals:   01/28/16 1059  BP: 156/92  Pulse: 73  Temp: 97.6 F (36.4 C)  Resp: 18     Body mass index is 38.09 kg/(m^2).    ECOG FS:0 - Asymptomatic  General: Well-developed, well-nourished, no acute distress. Somewhat depressed Eyes: anicteric sclera. Lungs: Clear to auscultation bilaterally. Heart: Regular rate and rhythm. No rubs, murmurs, or gallops. Abdomen: Soft, nontender, nondistended. No organomegaly noted, normoactive bowel sounds. Musculoskeletal: No edema, cyanosis, or clubbing. Neuro: Alert, answering all questions appropriately. Cranial nerves grossly intact. Skin: No rashes or petechiae noted. Psych: Normal affect. Lymphatics: Small palpable lymph node in the left-sided axillary area on her deep palpation No other palpable lymphadenopathy    LAB RESULTS:  Lab Results  Component Value Date   NA 140 01/28/2016   K 4.4 01/28/2016   CL 105 01/28/2016   CO2 31 01/28/2016   GLUCOSE 116* 01/28/2016   BUN 14 01/28/2016   CREATININE 0.61 01/28/2016   CALCIUM 9.4 01/28/2016   PROT 7.0 07/11/2014   ALBUMIN 3.6 07/11/2014   AST 23 07/11/2014   ALT 25 07/11/2014   ALKPHOS 84 07/11/2014   BILITOT 0.4 07/11/2014   GFRNONAA >60 01/28/2016   GFRAA >60 01/28/2016    Lab Results  Component Value Date   WBC 8.5 12/14/2015   NEUTROABS 3.2 12/14/2015   HGB 14.7 12/14/2015   HCT 43.5 12/14/2015   MCV 91.6 12/14/2015   PLT 267 12/14/2015     STUDIES: No results found.  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  All CT scan has been reviewed and compared there is progressive enlargement of lymph node.  Most bothersome is a right adnexal mass which is slightly increased Question is whether this right adnexal mass is lymphomatous involvement or second primary cancer I do not see any CA-125 done according to patient it might have been done by outside facility Dr. Leafy Ro might have done it will try to discuss situation with Dr. Leafy Ro Also if Dr. Leafy Ro agrees will consider possibility of GYN oncology evaluation If this is lymphoma involving ovaries will talk pros and cons of doing bilateral oophorectomy versus proceeding with chemotherapy or rituximab therapy as patient is getting extremely symptomatic Combined large marginal zone lymphomas are not treated with chemotherapy and most of her time rituximab is given the patient is a progressive symptoms  Pathology report has been reviewed independently.  All other CT scan has been reviewed independently.  Patient expressed understanding and was in agreement with this plan. She also understands that She can call clinic at any time with any questions, concerns, or complaints.    Forest Gleason, MD   01/28/2016 11:23 AM

## 2016-01-28 NOTE — Patient Instructions (Signed)
  Your procedure is scheduled on: Friday March. 3, 2017. Report to Same Day Surgery. To find out your arrival time please call [phone] between 1PM - 3PM on Thursday February 07, 2016.  Remember: Instructions that are not followed completely may result in serious medical risk, up to and including death, or upon the discretion of your surgeon and anesthesiologist your surgery may need to be rescheduled.    _x___ 1. Do not eat food or drink liquids after midnight. No gum chewing or hard candies.     ____ 2. No Alcohol for 24 hours before or after surgery.   ____ 3. Bring all medications with you on the day of surgery if instructed.    __x__ 4. Notify your doctor if there is any change in your medical condition     (cold, fever, infections).     Do not wear jewelry, make-up, hairpins, clips or nail polish.  Do not wear lotions, powders, or perfumes. You may wear deodorant.  Do not shave 48 hours prior to surgery. Men may shave face and neck.  Do not bring valuables to the hospital.    [facility] is not responsible for any belongings or valuables.               Contacts, dentures or bridgework may not be worn into surgery.  Leave your suitcase in the car. After surgery it may be brought to your room.  For patients admitted to the hospital, discharge time is determined by your treatment team.   Patients discharged the day of surgery will not be allowed to drive home.    Please read over the following fact sheets that you were given:   [facility] Preparing for Surgery  __x__ Take these medicines the morning of surgery with A SIP OF WATER:    1. atenolol (TENORMIN)  2. levothyroxine (SYNTHROID, LEVOTHROID)  3. omeprazole (PRILOSEC)   ALSO take an extra dose at bedtime 02/07/16.   ____ Fleet Enema (as directed)   _x_ Use CHG Soap as directed  ____ Use inhalers on the day of surgery  ____ Stop metformin 2 days prior to surgery    ____ Take 1/2 of usual insulin dose the night  before surgery and none on the morning of surgery.   ____ Stop aspirin on 01/28/2016 per surgeon's instruction.  __x__ Stop Anti-inflammatories on does not apply.  OK to take Tylenol for pain.   ____ Stop supplements until after surgery.    ____ Bring C-Pap to the hospital.

## 2016-01-28 NOTE — H&P (Signed)
Kaitlin Thompson is a 65 y.o. female here for Pre Op Consulting .  Persistent ovarian cysts/masses. On u/s today still having signficiant pain. Ovaries remain enlarged.  R ovary/adnexal mass unchanged at 4cm L ovary/adnexal mass enlarged from volume of 8cm3 9/29 to 16cm3 today. No internal flow or shadowing, but heterogenous mass  CA125 normal  On ASA, prior TAH, no other surgeries. Not really allergic to perceocet or reglan, but gets "antsy" on them.  Hx of non-hodgkins lymphoma  Past Medical History:  has a past medical history of Anxiety, unspecified; Arthritis; Benign fundic gland polyps of stomach (12/20/2015); CTS (carpal tunnel syndrome); DDD (degenerative disc disease); Diverticulosis; GERD (gastroesophageal reflux disease); Hemorrhoids; Herniated disc; History of shingles (09/2002); HTN (hypertension); Hypothyroidism, unspecified; Lymph node enlargement; Migraine; Nephrolithiasis; Non Hodgkin's lymphoma (CMS-HCC); Personal history of kidney stones; and Tubular adenoma of colon (12/20/2015).  Past Surgical History:  has a past surgical history that includes Knee arthroscopy (Right); Closed Reduction Carpel Scaphoid Fracture W/Manipulation; RENAL STONE REMOVAL; Hysterectomy (04/1980); Carpal tunnel repair (03/1998); Ruptured cervical disc status post repair (04/1998); colonoscopy (10/08/2010); egd (10/08/2010); Colonoscopy (12/20/2015); and egd (12/20/2015). Family History: family history includes Anemia in her daughter; Breast cancer in her daughter; Colon cancer in her cousin; Colon polyps in her father; Diabetes mellitus in her father, paternal grandfather, and paternal grandmother; Heart disease in her paternal grandfather; Liver cancer in her father; Lymphoma in her mother; Ovarian cancer in her maternal grandmother; Pancreatic cancer in her father; Stroke in her daughter. Social History:  reports that she quit smoking about 21 years ago. She has never used smokeless tobacco. She  reports that she does not drink alcohol or use illicit drugs. OB/GYN History:  OB History    Gravida Para Term Preterm AB TAB SAB Ectopic Multiple Living   3 2 2  1  1   2       Allergies: is allergic to bentyl [dicyclomine]; duratuss ac12 [diphenhydramine-pe-dm tannates]; percocet [oxycodone-acetaminophen]; and reglan [metoclopramide]. Medications:  Current Outpatient Prescriptions:  . acetaminophen (TYLENOL) 500 MG tablet, Take 1,000 mg by mouth as needed for Pain., Disp: , Rfl:  . ALPRAZolam (XANAX) 0.25 MG tablet, Take 1 tablet (0.25 mg total) by mouth once daily as needed for Sleep., Disp: 30 tablet, Rfl: 1 . amitriptyline (ELAVIL) 25 MG tablet, Take 25 mg by mouth nightly. 1/2 as needed for headache., Disp: , Rfl:  . aspirin 81 MG EC tablet, Take 81 mg by mouth once daily., Disp: , Rfl:  . atenolol (TENORMIN) 50 MG tablet, Take 1 tablet (50 mg total) by mouth once daily., Disp: 90 tablet, Rfl: 1 . fluticasone (FLONASE) 50 mcg/actuation nasal spray, Place 1 spray into both nostrils once daily., Disp: 16 g, Rfl: 5 . levothyroxine (SYNTHROID, LEVOTHROID) 112 MCG tablet, TAKE 1 TABLET DAILY. TAKE ON AN EMPTY STOMACH WITH A GLASS OF WATER AT LEAST 30-60 MINUTES BEFORE BREAKFAST., Disp: 90 tablet, Rfl: 1 . omeprazole (PRILOSEC) 20 MG DR capsule, Take 1 capsule (20 mg total) by mouth once daily., Disp: 90 capsule, Rfl: 1 . HYDROcodone-acetaminophen (NORCO) 5-325 mg tablet, Take 1 tablet by mouth every 4 (four) hours as needed for Pain. (Patient not taking: Reported on 01/11/2016 ), Disp: 20 tablet, Rfl: 0   Review of Systems: General:   No fatigue or weight loss Eyes:   No vision changes Ears:   No hearing difficulty Respiratory:   No cough or shortness of breath Pulmonary:   No asthma or shortness of breath Cardiovascular:  No chest pain,  palpitations, dyspnea on exertion Gastrointestinal:  No abdominal bloating, chronic diarrhea, constipations, masses, pain or  hematochezia Genitourinary:  No hematuria, dysuria, abnormal vaginal discharge, pelvic pain. No PMB Lymphatic:  No swollen lymph nodes Musculoskeletal: No muscle weakness Neurologic:  No extremity weakness, syncope, seizure disorder Psychiatric:  No history of depression, delusions or suicidal/homicidal ideation   Exam:      Vitals:   01/11/16 0951  BP: 138/90  Pulse: 67   Body mass index is 37.39 kg/(m^2).  WDWN white female in NAD Lungs: CTA  CV : RRR without murmur  Breast: deferred Neck: no thyromegaly Abdomen: soft , no mass, normal active bowel sounds, non-tender, no rebound tenderness Skin: No rashes, ulcers or skin lesions noted. No excessive hirsutism or acne noted.  Neurological: Appears alert and oriented and is a good historian. No gross abnormalities are noted. Psychological: Normal affect and mood. No signs of anxiety or depression noted.   Pelvic:  Deferred   Impression:   The primary encounter diagnosis was Adnexal mass. A diagnosis of Pelvic pain in female was also pertinent to this visit.    Plan:   - Patient returns for a preoperative discussion regarding her plans to proceed with surgical treatment of her pelvic pain and persistent adnexal masses with bloating by laproscopic bilateral oopherectomy with pelvic washings and possible peritoneal biopsies, with full survey of the liver and upper abdomen procedure.   The patient and I discussed the technical aspects of the procedure including the potential for risks and complications. These include but are not limited to the risk of infection requiring post-operative antibiotics or further procedures. We talked about the risk of injury to adjacent organs including bladder, bowel, ureter, blood vessels or nerves. We talked about the need to convert to an open incision. We talked about the possible need for blood transfusion. We talked aboutpostop complications such asthromboembolic or  cardiopulmonary complications. All of her questions were answered. Her preoperative exam was completed and the appropriate consents were signed. She is scheduled to undergo this procedure in the near future.   Return in about 2 weeks (around 01/25/2016) for Postop check.  Sherrie George, MD

## 2016-01-29 LAB — CA 125: CA 125: 20.7 U/mL (ref 0.0–38.1)

## 2016-02-06 ENCOUNTER — Encounter: Payer: Self-pay | Admitting: Obstetrics and Gynecology

## 2016-02-06 ENCOUNTER — Inpatient Hospital Stay: Payer: Medicare Other | Attending: Obstetrics and Gynecology | Admitting: Obstetrics and Gynecology

## 2016-02-06 VITALS — BP 169/92 | HR 72 | Temp 97.5°F | Resp 18 | Ht 64.0 in | Wt 222.9 lb

## 2016-02-06 DIAGNOSIS — N838 Other noninflammatory disorders of ovary, fallopian tube and broad ligament: Secondary | ICD-10-CM

## 2016-02-06 DIAGNOSIS — Z800 Family history of malignant neoplasm of digestive organs: Secondary | ICD-10-CM | POA: Insufficient documentation

## 2016-02-06 DIAGNOSIS — Z5112 Encounter for antineoplastic immunotherapy: Secondary | ICD-10-CM | POA: Insufficient documentation

## 2016-02-06 DIAGNOSIS — K219 Gastro-esophageal reflux disease without esophagitis: Secondary | ICD-10-CM | POA: Insufficient documentation

## 2016-02-06 DIAGNOSIS — Z8041 Family history of malignant neoplasm of ovary: Secondary | ICD-10-CM | POA: Insufficient documentation

## 2016-02-06 DIAGNOSIS — E039 Hypothyroidism, unspecified: Secondary | ICD-10-CM | POA: Insufficient documentation

## 2016-02-06 DIAGNOSIS — R229 Localized swelling, mass and lump, unspecified: Secondary | ICD-10-CM | POA: Insufficient documentation

## 2016-02-06 DIAGNOSIS — D3911 Neoplasm of uncertain behavior of right ovary: Secondary | ICD-10-CM

## 2016-02-06 DIAGNOSIS — C8510 Unspecified B-cell lymphoma, unspecified site: Secondary | ICD-10-CM | POA: Diagnosis not present

## 2016-02-06 DIAGNOSIS — Z79899 Other long term (current) drug therapy: Secondary | ICD-10-CM | POA: Insufficient documentation

## 2016-02-06 DIAGNOSIS — M545 Low back pain: Secondary | ICD-10-CM | POA: Insufficient documentation

## 2016-02-06 DIAGNOSIS — Z87442 Personal history of urinary calculi: Secondary | ICD-10-CM | POA: Insufficient documentation

## 2016-02-06 DIAGNOSIS — R61 Generalized hyperhidrosis: Secondary | ICD-10-CM | POA: Insufficient documentation

## 2016-02-06 DIAGNOSIS — I10 Essential (primary) hypertension: Secondary | ICD-10-CM | POA: Insufficient documentation

## 2016-02-06 DIAGNOSIS — R109 Unspecified abdominal pain: Secondary | ICD-10-CM | POA: Insufficient documentation

## 2016-02-06 DIAGNOSIS — F418 Other specified anxiety disorders: Secondary | ICD-10-CM | POA: Insufficient documentation

## 2016-02-06 DIAGNOSIS — Z87891 Personal history of nicotine dependence: Secondary | ICD-10-CM | POA: Insufficient documentation

## 2016-02-06 DIAGNOSIS — Z7982 Long term (current) use of aspirin: Secondary | ICD-10-CM | POA: Insufficient documentation

## 2016-02-06 DIAGNOSIS — K5790 Diverticulosis of intestine, part unspecified, without perforation or abscess without bleeding: Secondary | ICD-10-CM | POA: Insufficient documentation

## 2016-02-06 DIAGNOSIS — R531 Weakness: Secondary | ICD-10-CM | POA: Insufficient documentation

## 2016-02-06 DIAGNOSIS — M1990 Unspecified osteoarthritis, unspecified site: Secondary | ICD-10-CM | POA: Diagnosis not present

## 2016-02-06 DIAGNOSIS — M4696 Unspecified inflammatory spondylopathy, lumbar region: Secondary | ICD-10-CM

## 2016-02-06 DIAGNOSIS — R5383 Other fatigue: Secondary | ICD-10-CM | POA: Insufficient documentation

## 2016-02-06 DIAGNOSIS — F419 Anxiety disorder, unspecified: Secondary | ICD-10-CM | POA: Insufficient documentation

## 2016-02-06 DIAGNOSIS — Z803 Family history of malignant neoplasm of breast: Secondary | ICD-10-CM | POA: Insufficient documentation

## 2016-02-06 NOTE — Progress Notes (Signed)
Gynecologic Oncology Consult Visit   Referring Provider: Dr. Forest Gleason  Primary Gynecologist:  Benjaman Kindler, MD  Chief Concern: Right ovarian mass  Subjective:  Kaitlin Thompson is a 65 y.o. female who is seen in consultation from Dr. Oliva Bustard for evaluation of solid right ovarian mass in the setting of known low-grade B-cell lymphoma marginal zone.   Her history of low-grade B-cell lymphoma marginal zone is as follows. She was diagnosed in May of 2015. She did have one month of rituxamab therapy in July due to increase constitutional symptoms per her report. She has subsequently been followed with imaging. On 12/14/2015 she was noted to have progressive disease with enlargement of lymphadenopathy on a subsequent CT scan and worsening symptoms. Dr. Oliva Bustard is considering therapy.   Concurrently the patient has also been followed by Dr. Leafy Ro for a right adnexal mass and discussed possible oophorectomy. Her images for lymphoma therapy have demonstrated the follow findings with regard to the mass.   08/29/2014: Soft tissue density within the right paracentral pelvis is unchanged at 3.6 cm maximally and likely right ovarian in origin. Similar soft tissue density in the right paracentral pelvis. Favor right ovarian origin.   06/06/2015: Rounded mass within the right mid pelvis measures 3.4 by 4.5 cm compared to 3.3 by 3.9 cm. This mass was not significantly metabolic on prior PET-CT scan.  08/08/2015: Unremarkable left ovary and stable 39 mm right adnexal mass. This mass roughly doubled since 2006. No new enlargement of the right ovary to strongly suggest torsion Unremarkable left ovary and stable 39 mm right adnexal mass.   12/14/2015 A soft tissue density within the right hemipelvis is favored to arise from the right ovary. 3.9 x 3.4 cm on image 99/series 2. Similar to on the 06/06/2015 exam (when remeasured).  01/28/2016 Ultrasound with Dr. Leafy Ro revealed ovaries remain enlarged. R  ovary/adnexal mass unchanged at 4cm. L ovary/adnexal mass enlarged from volume of 8cm3 9/29 to 16cm3 today. No internal flow or shadowing, but heterogenous mass.   Lab Results  Component Value Date   CA125 20.7 01/28/2016    She presents today to discuss management options. She is complaining of fatigue, weight gain, night sweats, axillary/back/pelvic pain.     Problem List: Patient Active Problem List   Diagnosis Date Noted  . Adnexal mass 01/28/2016  . Anxiety 01/28/2016  . Arthritis 01/28/2016  . Adenopathy 01/28/2016  . Headache, migraine 01/28/2016  . Adult hypothyroidism 01/28/2016  . BP (high blood pressure) 01/28/2016  . DD (diverticular disease) 01/28/2016  . Acid reflux 01/28/2016  . Degeneration of intervertebral disc of lumbar region 11/06/2015  . Non-Hodgkin's lymphoma ([place]) 11/29/2014  . Degenerative arthritis of lumbar spine 04/27/2014  . Lumbar canal stenosis 04/27/2014  . Neuritis or radiculitis due to rupture of lumbar intervertebral disc 04/27/2014    Past Medical History: Past Medical History  Diagnosis Date  . Hypertension   . Thyroid disease     hypothyroidism  . GERD (gastroesophageal reflux disease)   . Anxiety   . S/P partial hysterectomy   . History of appendectomy     30 plus yrs ago  . Kidney stones   . Non Hodgkin's lymphoma ([place]) 2015  . Hypothyroidism     Past Surgical History: Past Surgical History  Procedure Laterality Date  . Abdominal hysterectomy    . Carpal tunnel release    . Cervical spine surgery    . Colonoscopy with propofol N/A 12/20/2015    Procedure: COLONOSCOPY WITH PROPOFOL;  Surgeon: Hulen Luster, MD;  Location: [facility] ENDOSCOPY;  Service: Gastroenterology;  Laterality: N/A;  . Esophagogastroduodenoscopy (egd) with propofol N/A 12/20/2015    Procedure: ESOPHAGOGASTRODUODENOSCOPY (EGD) WITH PROPOFOL;  Surgeon: Hulen Luster, MD;  Location[facility] ENDOSCOPY;  Service: Gastroenterology;  Laterality: N/A;  . Appendectomy    .  Joint replacement Left 01/2015    knee  . Tonsillectomy    . Axillary lymph node biopsy Left 2015    Past Gynecologic History:  No history of abnormal Pap smears. S/p hysterectomy.   OB History:  OB History  Gravida Para Term Preterm AB SAB TAB Ectopic Multiple Living  1             # Outcome Date GA Lbr Len/2nd Weight Sex Delivery Anes PTL Lv  1 Gravida               Family History: Family History  Problem Relation Age of Onset  . Cancer Other     breast cancer, colon cancer, lymphoma, ovarian cancer  . Diabetes Other   . Anemia Other   . CAD Other   . Liver disease Other   . Breast cancer Daughter   . Lymphoma Mother   . Pancreatic cancer Father   . Liver cancer Father     Social History: Social History   Social History  . Marital Status: Married    Spouse Name: N/A  . Number of Children: N/A  . Years of Education: N/A   Occupational History  . Not on file.   Social History Main Topics  . Smoking status: Former Smoker -- 1.00 packs/day for 20 years    Quit date: 01/27/1994  . Smokeless tobacco: Never Used  . Alcohol Use: No  . Drug Use: No  . Sexual Activity: Yes   Other Topics Concern  . Not on file   Social History Narrative    Allergies: Allergies  Allergen Reactions  . Diphenhydramine-Acetaminophen Other (See Comments)  . Metoclopramide Other (See Comments)    Tremors  . Percocet [Oxycodone-Acetaminophen]   . Dicyclomine Anxiety    Tremors    Current Medications: Current Outpatient Prescriptions  Medication Sig Dispense Refill  . ALPRAZolam (XANAX) 0.25 MG tablet Take 0.25 mg by mouth at bedtime as needed for anxiety.    Marland Kitchen amitriptyline (ELAVIL) 25 MG tablet Take 25 mg by mouth as needed (for head aches).     Marland Kitchen atenolol (TENORMIN) 50 MG tablet Take 50 mg by mouth every morning.     . fluticasone (FLONASE) 50 MCG/ACT nasal spray Place into the nose as needed.     Marland Kitchen levothyroxine (SYNTHROID, LEVOTHROID) 112 MCG tablet Take 112 mcg by  mouth daily before breakfast.    . omeprazole (PRILOSEC) 20 MG capsule Take 20 mg by mouth every morning.     Marland Kitchen aspirin 81 MG tablet Take 81 mg by mouth every morning. Reported on 02/06/2016     No current facility-administered medications for this visit.    Review of Systems General: fatigue, changes in weight or appetite Skin: negative Eyes: negative HEENT: negative Breasts: negative Pulmonary: dyspnea Cardiac: negative Gastrointestinal: nausea Genitourinary/Sexual: negative for, dysuria, retention, stones, infections Ob/Gyn: negative for, irregular bleeding, pain Musculoskeletal: pain Hematology: easy bruising Neurologic/Psych: weakness, depression  Objective:  Physical Examination:  BP 169/92 mmHg  Pulse 72  Temp(Src) 97.5 F (36.4 C) (Oral)  Resp 18  Ht 5\' 4"  (1.626 m)  Wt 222 lb 14.2 oz (101.1 kg)  BMI 38.24 kg/m2  ECOG Performance Status: 1 - Symptomatic but completely ambulatory  General appearance: alert, cooperative and appears stated age HEENT:PERRLA, extra ocular movement intact and sclera clear, anicteric Lymph node survey: palpable left axillary adenopathy o/w non-palpable, axillary, inguinal, supraclavicular Cardiovascular: regular rate and rhythm Respiratory: normal air entry, lungs clear to auscultation Breast exam: breasts appear normal, no suspicious masses, no skin or nipple changes or axillary nodes. Abdomen: soft, non-tender, without masses or organomegaly, nondistended, no hernias and well healed incision Back: inspection of back is normal Extremities: extremities normal, atraumatic, no cyanosis or edema Skin exam - normal coloration and turgor, no rashes, no suspicious skin lesions noted. Neurological exam reveals alert, oriented, normal speech, no focal findings or movement disorder noted.  Pelvic: exam chaperoned by nurse;  Vulva: normal appearing vulva with no masses, tenderness or lesions; Vagina: normal vagina; Adnexa: tenderness right with  possible firm mass at the vaginal cuff but difficult to delineate; Uterus: surgically absent, vaginal cuff well healed; Cervix: absent; Rectal: not indicated   Radiologic Imaging: Reviewed prior films    Assessment:  Kaitlin Thompson is a 65 y.o. female diagnosed with  solid right ovarian mass in the setting of known low-grade B-cell lymphoma marginal zone with constitutional symptoms and imaging c/w progressive lymphoma.   Medical co-morbidities complicating care: lymphoma, HTN, obesity and prior abdominal surgery.  Plan:   Problem List Items Addressed This Visit    None    Visit Diagnoses    Ovarian mass, right    -  Primary       We discussed options for management including ovarian mass. Given that she has symptomatic progressive low-grade B-cell lymphoma marginal zone I agree with Dr. Oliva Bustard about delaying surgical evaluation. If she demonstrates response to therapy and the solid ovarian mass decreases in size and pelvic symptoms resolve she may be able to avoid surgery. Further management will be based on imaging studies    The patient's diagnosis, an outline of the further diagnostic and laboratory studies which will be required, the recommendation, and alternatives were discussed.  All questions were answered to the patient's satisfaction.  A total of 44minutes were spent with the patient/family today; 50% was spent in education, counseling and coordination of care.   Return to clinic in 3 months for evaluation.    Gillis Ends, MD    CC:  Referring Provider: Dr. Forest Gleason  Primary Gynecologist:  Benjaman Kindler, MD

## 2016-02-06 NOTE — Progress Notes (Signed)
Patient here today for ovarian mass. States she has tenderness in her lower abdomen 3/10 and reports "I feel pressure when I stand for a long period of time".  Denies any bleeding or urinary urgency or frequency.

## 2016-02-08 ENCOUNTER — Ambulatory Visit
Admission: RE | Admit: 2016-02-08 | Payer: Medicare Other | Source: Ambulatory Visit | Admitting: Obstetrics and Gynecology

## 2016-02-08 ENCOUNTER — Encounter: Admission: RE | Payer: Self-pay | Source: Ambulatory Visit

## 2016-02-08 SURGERY — OOPHORECTOMY, LAPAROSCOPIC
Anesthesia: Choice | Laterality: Bilateral

## 2016-02-12 ENCOUNTER — Inpatient Hospital Stay (HOSPITAL_BASED_OUTPATIENT_CLINIC_OR_DEPARTMENT_OTHER): Payer: Medicare Other | Admitting: Oncology

## 2016-02-12 ENCOUNTER — Encounter: Payer: Self-pay | Admitting: Oncology

## 2016-02-12 VITALS — BP 149/85 | HR 76 | Temp 96.9°F | Resp 18 | Wt 222.9 lb

## 2016-02-12 DIAGNOSIS — D3911 Neoplasm of uncertain behavior of right ovary: Secondary | ICD-10-CM

## 2016-02-12 DIAGNOSIS — Z79899 Other long term (current) drug therapy: Secondary | ICD-10-CM

## 2016-02-12 DIAGNOSIS — Z87442 Personal history of urinary calculi: Secondary | ICD-10-CM | POA: Diagnosis not present

## 2016-02-12 DIAGNOSIS — R109 Unspecified abdominal pain: Secondary | ICD-10-CM | POA: Diagnosis not present

## 2016-02-12 DIAGNOSIS — Z87891 Personal history of nicotine dependence: Secondary | ICD-10-CM

## 2016-02-12 DIAGNOSIS — Z800 Family history of malignant neoplasm of digestive organs: Secondary | ICD-10-CM | POA: Diagnosis not present

## 2016-02-12 DIAGNOSIS — M4696 Unspecified inflammatory spondylopathy, lumbar region: Secondary | ICD-10-CM

## 2016-02-12 DIAGNOSIS — E039 Hypothyroidism, unspecified: Secondary | ICD-10-CM

## 2016-02-12 DIAGNOSIS — K219 Gastro-esophageal reflux disease without esophagitis: Secondary | ICD-10-CM

## 2016-02-12 DIAGNOSIS — Z803 Family history of malignant neoplasm of breast: Secondary | ICD-10-CM

## 2016-02-12 DIAGNOSIS — K5790 Diverticulosis of intestine, part unspecified, without perforation or abscess without bleeding: Secondary | ICD-10-CM | POA: Diagnosis not present

## 2016-02-12 DIAGNOSIS — Z7982 Long term (current) use of aspirin: Secondary | ICD-10-CM | POA: Diagnosis not present

## 2016-02-12 DIAGNOSIS — Z8041 Family history of malignant neoplasm of ovary: Secondary | ICD-10-CM

## 2016-02-12 DIAGNOSIS — M545 Low back pain: Secondary | ICD-10-CM | POA: Diagnosis not present

## 2016-02-12 DIAGNOSIS — R61 Generalized hyperhidrosis: Secondary | ICD-10-CM | POA: Diagnosis not present

## 2016-02-12 DIAGNOSIS — R229 Localized swelling, mass and lump, unspecified: Secondary | ICD-10-CM | POA: Diagnosis not present

## 2016-02-12 DIAGNOSIS — F418 Other specified anxiety disorders: Secondary | ICD-10-CM | POA: Diagnosis not present

## 2016-02-12 DIAGNOSIS — I10 Essential (primary) hypertension: Secondary | ICD-10-CM

## 2016-02-12 DIAGNOSIS — M1990 Unspecified osteoarthritis, unspecified site: Secondary | ICD-10-CM | POA: Diagnosis not present

## 2016-02-12 DIAGNOSIS — R5383 Other fatigue: Secondary | ICD-10-CM | POA: Diagnosis not present

## 2016-02-12 DIAGNOSIS — R531 Weakness: Secondary | ICD-10-CM | POA: Diagnosis not present

## 2016-02-12 DIAGNOSIS — C8510 Unspecified B-cell lymphoma, unspecified site: Secondary | ICD-10-CM | POA: Diagnosis not present

## 2016-02-12 DIAGNOSIS — C8590 Non-Hodgkin lymphoma, unspecified, unspecified site: Secondary | ICD-10-CM

## 2016-02-12 DIAGNOSIS — F419 Anxiety disorder, unspecified: Secondary | ICD-10-CM | POA: Diagnosis not present

## 2016-02-12 DIAGNOSIS — Z5112 Encounter for antineoplastic immunotherapy: Secondary | ICD-10-CM | POA: Diagnosis present

## 2016-02-12 NOTE — Progress Notes (Signed)
[place]  Telephone:([phone]) [phone] Fax:([phone]) [phone]  ID: Rufina Falco OB: 11-30-1951  MR#: [id number]  [id number]  Patient Care Team: Tracie Harrier, MD as PCP - General (Internal Medicine) Benjaman Kindler, MD as Consulting Physician (Obstetrics and Gynecology)  1.  Oncology history Diagnosis of   low-grade B-cell lymphoma marginal zone.  From the left axillary lymph node biopsy in May of 2015 Patient had a weekly rituximab therapy in May 2015 2.  Progressive disease with enlargement of lymphadenopathy on a subsequent CT scan 3.  Right adnexal mass and followed by Dr. Leafy Ro and possibility of oophorectomy has been planned on March 3 of 2017 Oophorectomy has been canceled and patient was evaluated by GYN oncologist   Interval history 65 year old lady who switched oncologist from Dr. Grayland Ormond to me came today further follow-up.  Since last evaluation patient continues to feel weak and tired.  Let cough energy.  Continues to have low back pain and pain radiating down to up lower abdominal area.  Night sweats. Patient has been evaluated by GYN oncologist.  Bilateral oophorectomy has been canceled. CA-125 remains within acceptable normal range Patient is here to discuss and further planning of treatment.  Accompanied by daughter  REVIEW OF SYSTEMS:  Gen. status: Patient is feeling weak tired lack of energy.  Fatigue.  Somewhat depressed. No chills but night sweats. HEENT: Denies any headache no difficulty swallowing Skin: No rash Lungs: No shortness of breath. GI: Lower abdominal discomfort.  No diarrhea.  No nausea or vomiting. GU: No dysuria or hematuria Musculoskeletal system: Low back pain and pain radiating in the front.  Continue sternal aching had an MRI scan in 2015. Neurological system: No headache no dizziness  ROSAs per HPI. Otherwise, a complete review of systems is negatve.  PAST MEDICAL HISTORY: Past Medical History  Diagnosis Date    . Hypertension   . Thyroid disease     hypothyroidism  . GERD (gastroesophageal reflux disease)   . Anxiety   . S/P partial hysterectomy   . History of appendectomy     30 plus yrs ago  . Kidney stones   . Non Hodgkin's lymphoma ([place]) 2015  . Hypothyroidism     PAST SURGICAL HISTORY: Past Surgical History  Procedure Laterality Date  . Abdominal hysterectomy    . Carpal tunnel release    . Cervical spine surgery    . Colonoscopy with propofol N/A 12/20/2015    Procedure: COLONOSCOPY WITH PROPOFOL;  Surgeon: Hulen Luster, MD;  Location: [facility] ENDOSCOPY;  Service: Gastroenterology;  Laterality: N/A;  . Esophagogastroduodenoscopy (egd) with propofol N/A 12/20/2015    Procedure: ESOPHAGOGASTRODUODENOSCOPY (EGD) WITH PROPOFOL;  Surgeon: Hulen Luster, MD;  Location: [facility] END[facility]COPY;  Service: Gastroenterology;  Laterality: N/A;  . Appendectomy    . Joint replacement Left 01/2015    knee  . Tonsillectomy    . Axillary lymph node biopsy Left 2015    FAMILY HISTORY Family History  Problem Relation Age of Onset  . Cancer Other     breast cancer, colon cancer, lymphoma, ovarian cancer  . Diabetes Other   . Anemia Other   . CAD Other   . Liver disease Other   . Breast cancer Daughter   . Lymphoma Mother   . Pancreatic cancer Father   . Liver cancer Father        ADVANCED DIRECTIVES:    HEALTH MAINTENANCE: Social History  Substance Use Topics  . Smoking status: Former Smoker -- 1.00 packs/day for  20 years    Quit date: 01/27/1994  . Smokeless tobacco: Never Used  . Alcohol Use: No      Allergies  Allergen Reactions  . Diphenhydramine-Acetaminophen Other (See Comments)  . Metoclopramide Other (See Comments)    Tremors  . Percocet [Oxycodone-Acetaminophen]   . Dicyclomine Anxiety    Tremors    Current Outpatient Prescriptions  Medication Sig Dispense Refill  . ALPRAZolam (XANAX) 0.25 MG tablet Take 0.25 mg by mouth at bedtime as needed for anxiety.    Marland Kitchen amitriptyline  (ELAVIL) 25 MG tablet Take 25 mg by mouth as needed (for head aches).     Marland Kitchen aspirin 81 MG tablet Take 81 mg by mouth every morning. Reported on 02/06/2016    . atenolol (TENORMIN) 50 MG tablet Take 50 mg by mouth every morning.     . fluticasone (FLONASE) 50 MCG/ACT nasal spray Place into the nose as needed.     Marland Kitchen levothyroxine (SYNTHROID, LEVOTHROID) 112 MCG tablet Take 112 mcg by mouth daily before breakfast.    . omeprazole (PRILOSEC) 20 MG capsule Take 20 mg by mouth every morning.      No current facility-administered medications for this visit.    OBJECTIVE: Filed Vitals:   02/12/16 1416  BP: 149/85  Pulse: 76  Temp: 96.9 F (36.1 C)  Resp: 18     Body mass index is 38.24 kg/(m^2).    ECOG FS:0 - Asymptomatic  General: Well-developed, well-nourished, no acute distress. Somewhat depressed Eyes: anicteric sclera. Lungs: Clear to auscultation bilaterally. Heart: Regular rate and rhythm. No rubs, murmurs, or gallops. Abdomen: Soft, nontender, nondistended. No organomegaly noted, normoactive bowel sounds. Musculoskeletal: No edema, cyanosis, or clubbing. Neuro: Alert, answering all questions appropriately. Cranial nerves grossly intact. Skin: No rashes or petechiae noted. Psych: Normal affect. Lymphatics: Small palpable lymph node in the left-sided axillary area on her deep palpation No other palpable lymphadenopathy All other system have been examined.   LAB RESULTS:  Lab Results  Component Value Date   NA 140 01/28/2016   K 4.4 01/28/2016   CL 105 01/28/2016   CO2 31 01/28/2016   GLUCOSE 116* 01/28/2016   BUN 14 01/28/2016   CREATININE 0.61 01/28/2016   CALCIUM 9.4 01/28/2016   PROT 7.0 07/11/2014   ALBUMIN 3.6 07/11/2014   AST 23 07/11/2014   ALT 25 07/11/2014   ALKPHOS 84 07/11/2014   BILITOT 0.4 07/11/2014   GFRNONAA >60 01/28/2016   GFRAA >60 01/28/2016    Lab Results  Component Value Date   WBC 8.3 01/28/2016   NEUTROABS 3.2 12/14/2015   HGB 14.3  01/28/2016   HCT 42.7 01/28/2016   MCV 91.9 01/28/2016   PLT 262 01/28/2016     STUDIES: No results found.  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  I had a prolonged discussion with patient and family Also discussed situation with GYN oncologist At present time we are facing following issues Even though we usually do not recommend any treatment for marginal zone lymphoma patient is becoming progressively symptomatic Back pain, enlarging adnexal masses, fatigue and weakness Night sweats Some of the symptoms can be related to lymphoma or there might be some independent other issues I suggested possibility of trying rituximab forced 4 cycles once a week and repeating PET scan on a CT scan. A. if adnexal mass does not resolve then possibility of oophorectomy can be considered B. A back pain does not get improved then MRI  scan of thoracic and lumbar spine can be done to further evaluate for any other possible reasons for the back pain. C, fatigue and weakness it does not improve other causes can be looked into it  Patient is agreeable to wait.  Dr. Theora Gianotti, and to GYN oncologist also agreed with that approach. Discussed situation with family and daughter and they are in agreement at present time  Total duration of visit was 30   minutes.  50% or more time was spent in counseling patient and family regarding prognosis and options of treatment and available resources and coordinating care with other physicians         Patient expressed understanding and was in agreement with this plan. She also understands that She can call clinic at any time with any questions, concerns, or complaints.    Forest Gleason, MD   02/12/2016 4:13 PM

## 2016-02-12 NOTE — Progress Notes (Signed)
Patient here today to discuss treatment plan. 

## 2016-02-20 ENCOUNTER — Inpatient Hospital Stay: Payer: Medicare Other

## 2016-02-20 ENCOUNTER — Encounter: Payer: Self-pay | Admitting: Oncology

## 2016-02-20 ENCOUNTER — Inpatient Hospital Stay (HOSPITAL_BASED_OUTPATIENT_CLINIC_OR_DEPARTMENT_OTHER): Payer: Medicare Other | Admitting: Oncology

## 2016-02-20 VITALS — BP 161/92 | HR 64 | Temp 97.2°F | Resp 18 | Wt 221.1 lb

## 2016-02-20 VITALS — BP 132/84 | HR 62 | Resp 18

## 2016-02-20 DIAGNOSIS — E039 Hypothyroidism, unspecified: Secondary | ICD-10-CM

## 2016-02-20 DIAGNOSIS — R5383 Other fatigue: Secondary | ICD-10-CM | POA: Diagnosis not present

## 2016-02-20 DIAGNOSIS — R229 Localized swelling, mass and lump, unspecified: Secondary | ICD-10-CM

## 2016-02-20 DIAGNOSIS — F418 Other specified anxiety disorders: Secondary | ICD-10-CM

## 2016-02-20 DIAGNOSIS — C8590 Non-Hodgkin lymphoma, unspecified, unspecified site: Secondary | ICD-10-CM

## 2016-02-20 DIAGNOSIS — R61 Generalized hyperhidrosis: Secondary | ICD-10-CM

## 2016-02-20 DIAGNOSIS — R531 Weakness: Secondary | ICD-10-CM

## 2016-02-20 DIAGNOSIS — Z5112 Encounter for antineoplastic immunotherapy: Secondary | ICD-10-CM | POA: Diagnosis not present

## 2016-02-20 DIAGNOSIS — I10 Essential (primary) hypertension: Secondary | ICD-10-CM

## 2016-02-20 DIAGNOSIS — R109 Unspecified abdominal pain: Secondary | ICD-10-CM

## 2016-02-20 DIAGNOSIS — Z87891 Personal history of nicotine dependence: Secondary | ICD-10-CM

## 2016-02-20 DIAGNOSIS — Z7982 Long term (current) use of aspirin: Secondary | ICD-10-CM

## 2016-02-20 DIAGNOSIS — Z79899 Other long term (current) drug therapy: Secondary | ICD-10-CM

## 2016-02-20 DIAGNOSIS — C8510 Unspecified B-cell lymphoma, unspecified site: Secondary | ICD-10-CM | POA: Diagnosis not present

## 2016-02-20 DIAGNOSIS — M545 Low back pain: Secondary | ICD-10-CM

## 2016-02-20 LAB — COMPREHENSIVE METABOLIC PANEL
ALT: 17 U/L (ref 14–54)
AST: 20 U/L (ref 15–41)
Albumin: 4.4 g/dL (ref 3.5–5.0)
Alkaline Phosphatase: 91 U/L (ref 38–126)
Anion gap: 5 (ref 5–15)
BUN: 14 mg/dL (ref 6–20)
CHLORIDE: 102 mmol/L (ref 101–111)
CO2: 27 mmol/L (ref 22–32)
Calcium: 9.2 mg/dL (ref 8.9–10.3)
Creatinine, Ser: 0.64 mg/dL (ref 0.44–1.00)
Glucose, Bld: 116 mg/dL — ABNORMAL HIGH (ref 65–99)
POTASSIUM: 4.3 mmol/L (ref 3.5–5.1)
SODIUM: 134 mmol/L — AB (ref 135–145)
TOTAL PROTEIN: 7.4 g/dL (ref 6.5–8.1)
Total Bilirubin: 0.9 mg/dL (ref 0.3–1.2)

## 2016-02-20 LAB — CBC WITH DIFFERENTIAL/PLATELET
Basophils Absolute: 0.1 10*3/uL (ref 0–0.1)
Basophils Relative: 1 %
EOS ABS: 0.2 10*3/uL (ref 0–0.7)
EOS PCT: 3 %
HCT: 42.1 % (ref 35.0–47.0)
Hemoglobin: 14.5 g/dL (ref 12.0–16.0)
LYMPHS ABS: 3.3 10*3/uL (ref 1.0–3.6)
Lymphocytes Relative: 52 %
MCH: 31.6 pg (ref 26.0–34.0)
MCHC: 34.4 g/dL (ref 32.0–36.0)
MCV: 92 fL (ref 80.0–100.0)
MONO ABS: 0.9 10*3/uL (ref 0.2–0.9)
Monocytes Relative: 15 %
Neutro Abs: 1.8 10*3/uL (ref 1.4–6.5)
Neutrophils Relative %: 29 %
PLATELETS: 251 10*3/uL (ref 150–440)
RBC: 4.58 MIL/uL (ref 3.80–5.20)
RDW: 14.3 % (ref 11.5–14.5)
WBC: 6.3 10*3/uL (ref 3.6–11.0)

## 2016-02-20 LAB — LACTATE DEHYDROGENASE: LDH: 170 U/L (ref 98–192)

## 2016-02-20 LAB — MAGNESIUM: Magnesium: 2 mg/dL (ref 1.7–2.4)

## 2016-02-20 MED ORDER — DIPHENHYDRAMINE HCL 50 MG/ML IJ SOLN
12.5000 mg | Freq: Once | INTRAMUSCULAR | Status: AC
  Administered 2016-02-20: 12.5 mg via INTRAVENOUS
  Filled 2016-02-20: qty 1

## 2016-02-20 MED ORDER — SODIUM CHLORIDE 0.9 % IV SOLN
Freq: Once | INTRAVENOUS | Status: AC
  Administered 2016-02-20: 11:00:00 via INTRAVENOUS
  Filled 2016-02-20: qty 1000

## 2016-02-20 MED ORDER — ACETAMINOPHEN 325 MG PO TABS
650.0000 mg | ORAL_TABLET | Freq: Once | ORAL | Status: AC
  Administered 2016-02-20: 650 mg via ORAL
  Filled 2016-02-20: qty 2

## 2016-02-20 MED ORDER — HYDROCORTISONE NA SUCCINATE PF 100 MG IJ SOLR
100.0000 mg | Freq: Once | INTRAMUSCULAR | Status: AC
  Administered 2016-02-20: 100 mg via INTRAVENOUS
  Filled 2016-02-20: qty 2

## 2016-02-20 MED ORDER — SODIUM CHLORIDE 0.9 % IV SOLN
375.0000 mg/m2 | Freq: Once | INTRAVENOUS | Status: AC
  Administered 2016-02-20: 800 mg via INTRAVENOUS
  Filled 2016-02-20: qty 70

## 2016-02-20 NOTE — Progress Notes (Signed)
[place]  Telephone:(336) [phone] Fax:(336) [phone]  ID: Kaitlin Thompson OB: 01-Jul-1951  MR#: [id number]  [id number]  Patient Care Team: Tracie Harrier, MD as PCP - General (Internal Medicine) Benjaman Kindler, MD as Consulting Physician (Obstetrics and Gynecology)  1.  Oncology history Diagnosis of   low-grade B-cell lymphoma marginal zone.  From the left axillary lymph node biopsy in May of 2015 Patient had a weekly rituximab therapy in May 2015 2.  Progressive disease with enlargement of lymphadenopathy on a subsequent CT scan 3.  Right adnexal mass and followed by Dr. Leafy Ro and possibility of oophorectomy has been planned on March 3 of 2017 Oophorectomy has been canceled and patient was evaluated by GYN oncologist   Interval history 65 year old lady who switched oncologist from Dr. Grayland Ormond to me came today further follow-up.  Since last evaluation patient continues to feel weak and tired.  Let cough energy.  Continues to have low back pain and pain radiating down to up lower abdominal area.  Night sweats. Patient has been evaluated by GYN oncologist.  Bilateral oophorectomy has been canceled. CA-125 remains within acceptable normal range Patient is here to discuss and further planning of treatment.  Accompanied by daughter Jenny Reichmann is here to initiate rituximab treatment.  No chills.  No fever.  Continues to feel weak and tired. REVIEW OF SYSTEMS:  Gen. status: Patient is feeling weak tired lack of energy.  Fatigue.  Somewhat depressed. No chills but night sweats. HEENT: Denies any headache no difficulty swallowing Skin: No rash Lungs: No shortness of breath. GI: Lower abdominal discomfort.  No diarrhea.  No nausea or vomiting. GU: No dysuria or hematuria Musculoskeletal system: Low back pain and pain radiating in the front.  Continue sternal aching had an MRI scan in 2015. Neurological system: No headache no dizziness  ROSAs per HPI. Otherwise, a  complete review of systems is negatve.  PAST MEDICAL HISTORY: Past Medical History  Diagnosis Date  . Hypertension   . Thyroid disease     hypothyroidism  . GERD (gastroesophageal reflux disease)   . Anxiety   . S/P partial hysterectomy   . History of appendectomy     30 plus yrs ago  . Kidney stones   . Non Hodgkin's lymphoma ([place]) 2015  . Hypothyroidism     PAST SURGICAL HISTORY: Past Surgical History  Procedure Laterality Date  . Abdominal hysterectomy    . Carpal tunnel release    . Cervical spine surgery    . Colonoscopy with propofol N/A 12/20/2015    Procedure: COLONOSCOPY WITH PROPOFOL;  Surgeon: Hulen Luster, MD;  Location[facility] ENDOSCOPY;  Service: Gastroenterology;  Laterality: N/A;  . Esophagogastroduodenoscopy (egd) with propofol N/A 12/20/2015    Procedure: ESOPHAGOGASTRODUODENOSCOPY (EGD) WITH PROPOFOL;  Surgeon: Hulen Luster, MD;  Location: [facility] ENDOSCOPY;  Service: Gastroenterology;  Laterality: N/A;  . Appendectomy    . Joint replacement Left 01/2015    knee  . Tonsillectomy    . Axillary lymph node biopsy Left 2015    FAMILY HISTORY Family History  Problem Relation Age of Onset  . Cancer Other     breast cancer, colon cancer, lymphoma, ovarian cancer  . Diabetes Other   . Anemia Other   . CAD Other   . Liver disease Other   . Breast cancer Daughter   . Lymphoma Mother   . Pancreatic cancer Father   . Liver cancer Father        ADVANCED DIRECTIVES:  HEALTH MAINTENANCE: Social History  Substance Use Topics  . Smoking status: Former Smoker -- 1.00 packs/day for 20 years    Quit date: 01/27/1994  . Smokeless tobacco: Never Used  . Alcohol Use: No      Allergies  Allergen Reactions  . Diphenhydramine-Acetaminophen Other (See Comments)  . Metoclopramide Other (See Comments)    Tremors  . Percocet [Oxycodone-Acetaminophen]   . Dicyclomine Anxiety    Tremors    Current Outpatient Prescriptions  Medication Sig Dispense Refill  .  ALPRAZolam (XANAX) 0.25 MG tablet Take 0.25 mg by mouth at bedtime as needed for anxiety.    Marland Kitchen amitriptyline (ELAVIL) 25 MG tablet Take 25 mg by mouth as needed (for head aches).     Marland Kitchen aspirin 81 MG tablet Take 81 mg by mouth every morning. Reported on 02/06/2016    . atenolol (TENORMIN) 50 MG tablet Take 50 mg by mouth every morning.     . fluticasone (FLONASE) 50 MCG/ACT nasal spray Place into the nose as needed.     Marland Kitchen levothyroxine (SYNTHROID, LEVOTHROID) 112 MCG tablet Take 112 mcg by mouth daily before breakfast.    . omeprazole (PRILOSEC) 20 MG capsule Take 20 mg by mouth every morning.      No current facility-administered medications for this visit.    OBJECTIVE: Filed Vitals:   02/20/16 1022  BP: 161/92  Pulse: 64  Temp: 97.2 F (36.2 C)  Resp: 18     Body mass index is 37.94 kg/(m^2).    ECOG FS:0 - Asymptomatic  General: Well-developed, well-nourished, no acute distress. Somewhat depressed Eyes: anicteric sclera. Lungs: Clear to auscultation bilaterally. Heart: Regular rate and rhythm. No rubs, murmurs, or gallops. Abdomen: Soft, nontender, nondistended. No organomegaly noted, normoactive bowel sounds. Musculoskeletal: No edema, cyanosis, or clubbing. Neuro: Alert, answering all questions appropriately. Cranial nerves grossly intact. Skin: No rashes or petechiae noted. Psych: Normal affect. Lymphatics: Small palpable lymph node in the left-sided axillary area on her deep palpation No other palpable lymphadenopathy All other system have been examined.   LAB RESULTS:  Lab Results  Component Value Date   NA 134* 02/20/2016   K 4.3 02/20/2016   CL 102 02/20/2016   CO2 27 02/20/2016   GLUCOSE 116* 02/20/2016   BUN 14 02/20/2016   CREATININE 0.64 02/20/2016   CALCIUM 9.2 02/20/2016   PROT 7.4 02/20/2016   ALBUMIN 4.4 02/20/2016   AST 20 02/20/2016   ALT 17 02/20/2016   ALKPHOS 91 02/20/2016   BILITOT 0.9 02/20/2016   GFRNONAA >60 02/20/2016   GFRAA >60  02/20/2016    Lab Results  Component Value Date   WBC 6.3 02/20/2016   NEUTROABS 1.8 02/20/2016   HGB 14.5 02/20/2016   HCT 42.1 02/20/2016   MCV 92.0 02/20/2016   PLT 251 02/20/2016     STUDIES: No results found.  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  I had a prolonged discussion with patient and family Also discussed situation with GYN oncologist At present time we are facing following issues Even though we usually do not recommend any treatment for marginal zone lymphoma patient is becoming progressively symptomatic Back pain, enlarging adnexal masses, fatigue and weakness Night sweats Some of the symptoms can be related to lymphoma or there might be some independent other issues I suggested possibility of trying rituximab forced 4 cycles once a week and repeating PET scan on a CT scan. A. if adnexal mass does not  resolve then possibility of oophorectomy can be considered B. A back pain does not get improved then MRI scan of thoracic and lumbar spine can be done to further evaluate for any other possible reasons for the back pain. C, fatigue and weakness it does not improve other causes can be looked into it      Lab data has been reviewed.  Informed consent has been obtained Proceed with rituximab treatment Will get once a week for a total for treatment and then reevaluation would be planned after several months Radius side effects of rituximab including allergic reaction has been discussed    Patient expressed understanding and was in agreement with this plan. She also understands that She can call clinic at any time with any questions, concerns, or complaints.    Forest Gleason, MD   02/20/2016 10:32 AM

## 2016-02-21 ENCOUNTER — Encounter: Payer: Self-pay | Admitting: Oncology

## 2016-02-28 ENCOUNTER — Encounter: Payer: Self-pay | Admitting: Oncology

## 2016-02-28 ENCOUNTER — Inpatient Hospital Stay (HOSPITAL_BASED_OUTPATIENT_CLINIC_OR_DEPARTMENT_OTHER): Payer: Medicare Other | Admitting: Oncology

## 2016-02-28 ENCOUNTER — Inpatient Hospital Stay: Payer: Medicare Other

## 2016-02-28 VITALS — BP 139/89 | HR 65 | Resp 18

## 2016-02-28 VITALS — BP 128/93 | HR 80 | Temp 97.7°F | Resp 18 | Wt 220.0 lb

## 2016-02-28 DIAGNOSIS — C8510 Unspecified B-cell lymphoma, unspecified site: Secondary | ICD-10-CM | POA: Diagnosis not present

## 2016-02-28 DIAGNOSIS — Z87891 Personal history of nicotine dependence: Secondary | ICD-10-CM

## 2016-02-28 DIAGNOSIS — F419 Anxiety disorder, unspecified: Secondary | ICD-10-CM

## 2016-02-28 DIAGNOSIS — K219 Gastro-esophageal reflux disease without esophagitis: Secondary | ICD-10-CM

## 2016-02-28 DIAGNOSIS — F418 Other specified anxiety disorders: Secondary | ICD-10-CM

## 2016-02-28 DIAGNOSIS — M1990 Unspecified osteoarthritis, unspecified site: Secondary | ICD-10-CM

## 2016-02-28 DIAGNOSIS — Z87442 Personal history of urinary calculi: Secondary | ICD-10-CM

## 2016-02-28 DIAGNOSIS — R229 Localized swelling, mass and lump, unspecified: Secondary | ICD-10-CM | POA: Diagnosis not present

## 2016-02-28 DIAGNOSIS — R5383 Other fatigue: Secondary | ICD-10-CM

## 2016-02-28 DIAGNOSIS — C8590 Non-Hodgkin lymphoma, unspecified, unspecified site: Secondary | ICD-10-CM

## 2016-02-28 DIAGNOSIS — Z7982 Long term (current) use of aspirin: Secondary | ICD-10-CM

## 2016-02-28 DIAGNOSIS — Z8041 Family history of malignant neoplasm of ovary: Secondary | ICD-10-CM

## 2016-02-28 DIAGNOSIS — D3911 Neoplasm of uncertain behavior of right ovary: Secondary | ICD-10-CM

## 2016-02-28 DIAGNOSIS — Z803 Family history of malignant neoplasm of breast: Secondary | ICD-10-CM

## 2016-02-28 DIAGNOSIS — K5790 Diverticulosis of intestine, part unspecified, without perforation or abscess without bleeding: Secondary | ICD-10-CM

## 2016-02-28 DIAGNOSIS — E039 Hypothyroidism, unspecified: Secondary | ICD-10-CM

## 2016-02-28 DIAGNOSIS — Z79899 Other long term (current) drug therapy: Secondary | ICD-10-CM

## 2016-02-28 DIAGNOSIS — M4696 Unspecified inflammatory spondylopathy, lumbar region: Secondary | ICD-10-CM

## 2016-02-28 DIAGNOSIS — R61 Generalized hyperhidrosis: Secondary | ICD-10-CM

## 2016-02-28 DIAGNOSIS — R531 Weakness: Secondary | ICD-10-CM | POA: Diagnosis not present

## 2016-02-28 DIAGNOSIS — M545 Low back pain: Secondary | ICD-10-CM

## 2016-02-28 DIAGNOSIS — R109 Unspecified abdominal pain: Secondary | ICD-10-CM

## 2016-02-28 DIAGNOSIS — I10 Essential (primary) hypertension: Secondary | ICD-10-CM

## 2016-02-28 DIAGNOSIS — Z5112 Encounter for antineoplastic immunotherapy: Secondary | ICD-10-CM | POA: Diagnosis not present

## 2016-02-28 DIAGNOSIS — Z800 Family history of malignant neoplasm of digestive organs: Secondary | ICD-10-CM

## 2016-02-28 LAB — CBC WITH DIFFERENTIAL/PLATELET
BASOS ABS: 0 10*3/uL (ref 0–0.1)
Basophils Relative: 1 %
Eosinophils Absolute: 0.1 10*3/uL (ref 0–0.7)
Eosinophils Relative: 2 %
HEMATOCRIT: 42.4 % (ref 35.0–47.0)
Hemoglobin: 14.6 g/dL (ref 12.0–16.0)
LYMPHS PCT: 55 %
Lymphs Abs: 3.4 10*3/uL (ref 1.0–3.6)
MCH: 31.3 pg (ref 26.0–34.0)
MCHC: 34.4 g/dL (ref 32.0–36.0)
MCV: 91 fL (ref 80.0–100.0)
MONO ABS: 1 10*3/uL — AB (ref 0.2–0.9)
MONOS PCT: 15 %
NEUTROS ABS: 1.7 10*3/uL (ref 1.4–6.5)
Neutrophils Relative %: 27 %
Platelets: 246 10*3/uL (ref 150–440)
RBC: 4.66 MIL/uL (ref 3.80–5.20)
RDW: 13.9 % (ref 11.5–14.5)
WBC: 6.2 10*3/uL (ref 3.6–11.0)

## 2016-02-28 LAB — MAGNESIUM: Magnesium: 1.9 mg/dL (ref 1.7–2.4)

## 2016-02-28 LAB — COMPREHENSIVE METABOLIC PANEL
ALK PHOS: 92 U/L (ref 38–126)
ALT: 17 U/L (ref 14–54)
AST: 22 U/L (ref 15–41)
Albumin: 4.3 g/dL (ref 3.5–5.0)
Anion gap: 7 (ref 5–15)
BILIRUBIN TOTAL: 0.7 mg/dL (ref 0.3–1.2)
BUN: 14 mg/dL (ref 6–20)
CO2: 24 mmol/L (ref 22–32)
Calcium: 9.4 mg/dL (ref 8.9–10.3)
Chloride: 104 mmol/L (ref 101–111)
Creatinine, Ser: 0.65 mg/dL (ref 0.44–1.00)
GFR calc Af Amer: >60 mL/min (ref >60)
GLUCOSE: 109 mg/dL — AB (ref 65–99)
POTASSIUM: 4 mmol/L (ref 3.5–5.1)
Sodium: 135 mmol/L (ref 135–145)
Total Protein: 7.2 g/dL (ref 6.5–8.1)

## 2016-02-28 MED ORDER — DIPHENHYDRAMINE HCL 50 MG/ML IJ SOLN
12.5000 mg | Freq: Once | INTRAMUSCULAR | Status: AC
  Administered 2016-02-28: 12.5 mg via INTRAVENOUS
  Filled 2016-02-28: qty 1

## 2016-02-28 MED ORDER — HYDROCORTISONE NA SUCCINATE PF 100 MG IJ SOLR
100.0000 mg | Freq: Once | INTRAMUSCULAR | Status: AC
  Administered 2016-02-28: 100 mg via INTRAVENOUS
  Filled 2016-02-28: qty 2

## 2016-02-28 MED ORDER — SODIUM CHLORIDE 0.9 % IV SOLN
375.0000 mg/m2 | Freq: Once | INTRAVENOUS | Status: AC
  Administered 2016-02-28: 800 mg via INTRAVENOUS
  Filled 2016-02-28: qty 70

## 2016-02-28 MED ORDER — SODIUM CHLORIDE 0.9 % IV SOLN
Freq: Once | INTRAVENOUS | Status: AC
  Administered 2016-02-28: 10:00:00 via INTRAVENOUS
  Filled 2016-02-28: qty 1000

## 2016-02-28 MED ORDER — ACETAMINOPHEN 325 MG PO TABS
650.0000 mg | ORAL_TABLET | Freq: Once | ORAL | Status: AC
  Administered 2016-02-28: 650 mg via ORAL
  Filled 2016-02-28: qty 2

## 2016-02-28 MED ORDER — SODIUM CHLORIDE 0.9 % IV SOLN: 375.0000 mg/m2 | Freq: Once | INTRAVENOUS | Status: DC

## 2016-02-29 ENCOUNTER — Encounter: Payer: Self-pay | Admitting: Oncology

## 2016-02-29 NOTE — Progress Notes (Signed)
[name]  Telephone:(336) [phone] Fax:(336) [phone]  ID: Kaitlin Thompson OB: May 20, 1951  MR#: [id number]  [id number]  Patient Care Team: Tracie Harrier, MD as PCP - General (Internal Medicine) Benjaman Kindler, MD as Consulting Physician (Obstetrics and Gynecology)  1.  Oncology history Diagnosis of   low-grade B-cell lymphoma marginal zone.  From the left axillary lymph node biopsy in May of 2015 Patient had a weekly rituximab therapy in May 2015 2.  Progressive disease with enlargement of lymphadenopathy on a subsequent CT scan 3.  Right adnexal mass and followed by Dr. Leafy Ro and possibility of oophorectomy has been planned on March 3 of 2017 Oophorectomy has been canceled and patient was evaluated by GYN oncologist   Interval history 64 year old lady who switched oncologist from Dr. Grayland Ormond to me came today further follow-up.  Since last evaluation patient continues to feel weak and tired.  Let cough energy.  Continues to have low back pain and pain radiating down to up lower abdominal area.  Night sweats. Patient has been evaluated by GYN oncologist.  Bilateral oophorectomy has been canceled. CA-125 remains within acceptable normal range Patient is here to discuss and further planning of treatment.  Accompanied by daughter Patient has started first cycle of rituximab therapy.  Patient was reevaluated today because of previous history of reaction to rituximab.  This time patient had some flushing but no other allergic reaction.  No chills.  No fever.  No rash.  No diarrhea.  Here for continuation of 3 more cycles of treatment REVIEW OF SYSTEMS:  Gen. status: Patient is feeling weak tired lack of energy.  Fatigue.  Somewhat depressed. No chills but night sweats. HEENT: Denies any headache no difficulty swallowing Skin: No rash Lungs: No shortness of breath. GI: Lower abdominal discomfort.  No diarrhea.  No nausea or vomiting. GU: No dysuria or  hematuria Musculoskeletal system: Low back pain and pain radiating in the front.  Continue sternal aching had an MRI scan in 2015. Neurological system: No headache no dizziness  ROSAs per HPI. Otherwise, a complete review of systems is negatve.  PAST MEDICAL HISTORY: Past Medical History  Diagnosis Date  . Hypertension   . Thyroid disease     hypothyroidism  . GERD (gastroesophageal reflux disease)   . Anxiety   . S/P partial hysterectomy   . History of appendectomy     30 plus yrs ago  . Kidney stones   . Non Hodgkin's lymphoma ([place]) 2015  . Hypothyroidism     PAST SURGICAL HISTORY: Past Surgical History  Procedure Laterality Date  . Abdominal hysterectomy    . Carpal tunnel release    . Cervical spine surgery    . Colonoscopy with propofol N/A 12/20/2015    Procedure: COLONOSCOPY WITH PROPOFOL;  Surgeon: Hulen Luster, MD;  Location: [facility] ENDOSCOPY;  Service: Gastroenterology;  Laterality: N/A;  . Esophagogastroduodenoscopy (egd) with propofol N/A 12/20/2015    Procedure: ESOPHAGOGASTRODUODENOSCOPY (EGD) WITH PROPOFOL;  Surgeon: Hulen Luster, MD;  Location: [facility]COPY;  Service: Gastroenterology;  Laterality: N/A;  . Appendectomy    . Joint replacement Left 01/2015    knee  . Tonsillectomy    . Axillary lymph node biopsy Left 2015    FAMILY HISTORY Family History  Problem Relation Age of Onset  . Cancer Other     breast cancer, colon cancer, lymphoma, ovarian cancer  . Diabetes Other   . Anemia Other   . CAD Other   . Liver disease Other   .  Breast cancer Daughter   . Lymphoma Mother   . Pancreatic cancer Father   . Liver cancer Father        ADVANCED DIRECTIVES:    HEALTH MAINTENANCE: Social History  Substance Use Topics  . Smoking status: Former Smoker -- 1.00 packs/day for 20 years    Quit date: 01/27/1994  . Smokeless tobacco: Never Used  . Alcohol Use: No      Allergies  Allergen Reactions  . Metoclopramide Other (See Comments)    Tremors  .  Percocet [Oxycodone-Acetaminophen]   . Dicyclomine Anxiety    Tremors    Current Outpatient Prescriptions  Medication Sig Dispense Refill  . ALPRAZolam (XANAX) 0.25 MG tablet Take 0.25 mg by mouth at bedtime as needed for anxiety.    Marland Kitchen amitriptyline (ELAVIL) 25 MG tablet Take 25 mg by mouth as needed (for head aches).     Marland Kitchen aspirin 81 MG tablet Take 81 mg by mouth every morning. Reported on 02/06/2016    . atenolol (TENORMIN) 50 MG tablet Take 50 mg by mouth every morning.     . fluticasone (FLONASE) 50 MCG/ACT nasal spray Place into the nose as needed.     Marland Kitchen levothyroxine (SYNTHROID, LEVOTHROID) 112 MCG tablet Take 112 mcg by mouth daily before breakfast.    . omeprazole (PRILOSEC) 20 MG capsule Take 20 mg by mouth every morning.      No current facility-administered medications for this visit.    OBJECTIVE: Filed Vitals:   02/28/16 0914  BP: 128/93  Pulse: 80  Temp: 97.7 F (36.5 C)  Resp: 18     Body mass index is 37.75 kg/(m^2).    ECOG FS:0 - Asymptomatic  General: Well-developed, well-nourished, no acute distress. Somewhat depressed Eyes: anicteric sclera. Lungs: Clear to auscultation bilaterally. Heart: Regular rate and rhythm. No rubs, murmurs, or gallops. Abdomen: Soft, nontender, nondistended. No organomegaly noted, normoactive bowel sounds. Musculoskeletal: No edema, cyanosis, or clubbing. Neuro: Alert, answering all questions appropriately. Cranial nerves grossly intact. Skin: No rashes or petechiae noted. Psych: Normal affect. Lymphatics: Small palpable lymph node in the left-sided axillary area on her deep palpation No other palpable lymphadenopathy All other system have been examined.   LAB RESULTS:  Lab Results  Component Value Date   NA 135 02/28/2016   K 4.0 02/28/2016   CL 104 02/28/2016   CO2 24 02/28/2016   GLUCOSE 109* 02/28/2016   BUN 14 02/28/2016   CREATININE 0.65 02/28/2016   CALCIUM 9.4 02/28/2016   PROT 7.2 02/28/2016   ALBUMIN 4.3  02/28/2016   AST 22 02/28/2016   ALT 17 02/28/2016   ALKPHOS 92 02/28/2016   BILITOT 0.7 02/28/2016   GFRNONAA >60 02/28/2016   GFRAA >60 02/28/2016    Lab Results  Component Value Date   WBC 6.2 02/28/2016   NEUTROABS 1.7 02/28/2016   HGB 14.6 02/28/2016   HCT 42.4 02/28/2016   MCV 91.0 02/28/2016   PLT 246 02/28/2016     STUDIES: No results found.  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 Patient tolerated first cycle of treatment very well.  We will proceed with 3 more cycles of rituximab and get a PET scan scheduled in 3 months for reevaluation.  I discussed that if we do not have significant improvement then possibility of chemotherapy can be considered.   Even though we usually do not recommend any treatment for marginal zone lymphoma patient is becoming progressively symptomatic Back pain,  enlarging adnexal masses, fatigue and weakness Night sweats Some of the symptoms can be related to lymphoma or there might be some independent other issues I suggested possibility of trying rituximab forced 4 cycles once a week and repeating PET scan on a CT scan. A. if adnexal mass does not resolve then possibility of oophorectomy can be considered B. A back pain does not get improved then MRI scan of thoracic and lumbar spine can be done to further evaluate for any other possible reasons for the back pain. C, fatigue and weakness it does not improve other causes can be looked into it      Lab data has been reviewed.  Informed consent has been obtained Proceed with rituximab treatment Will get once a week for a total for treatment and then reevaluation would be planned after several months Radius side effects of rituximab including allergic reaction has been discussed    Patient expressed understanding and was in agreement with this plan. She also understands that She can call clinic at any time with any questions,  concerns, or complaints.    Forest Gleason, MD   02/29/2016 8:49 AM

## 2016-03-06 ENCOUNTER — Inpatient Hospital Stay: Payer: Medicare Other

## 2016-03-06 VITALS — BP 131/83 | HR 68 | Resp 18

## 2016-03-06 DIAGNOSIS — Z5112 Encounter for antineoplastic immunotherapy: Secondary | ICD-10-CM | POA: Diagnosis not present

## 2016-03-06 DIAGNOSIS — C8590 Non-Hodgkin lymphoma, unspecified, unspecified site: Secondary | ICD-10-CM

## 2016-03-06 LAB — CBC WITH DIFFERENTIAL/PLATELET
BASOS ABS: 0.1 10*3/uL (ref 0–0.1)
Basophils Relative: 1 %
Eosinophils Absolute: 0.2 10*3/uL (ref 0–0.7)
Eosinophils Relative: 3 %
HEMATOCRIT: 41.3 % (ref 35.0–47.0)
Hemoglobin: 14.3 g/dL (ref 12.0–16.0)
LYMPHS PCT: 45 %
Lymphs Abs: 3.1 10*3/uL (ref 1.0–3.6)
MCH: 31.8 pg (ref 26.0–34.0)
MCHC: 34.6 g/dL (ref 32.0–36.0)
MCV: 91.8 fL (ref 80.0–100.0)
MONO ABS: 1 10*3/uL — AB (ref 0.2–0.9)
Monocytes Relative: 14 %
NEUTROS ABS: 2.6 10*3/uL (ref 1.4–6.5)
Neutrophils Relative %: 37 %
Platelets: 250 10*3/uL (ref 150–440)
RBC: 4.5 MIL/uL (ref 3.80–5.20)
RDW: 14.6 % — AB (ref 11.5–14.5)
WBC: 6.9 10*3/uL (ref 3.6–11.0)

## 2016-03-06 MED ORDER — RITUXIMAB CHEMO INJECTION 500 MG/50ML
375.0000 mg/m2 | Freq: Once | INTRAVENOUS | Status: AC
  Administered 2016-03-06: 800 mg via INTRAVENOUS
  Filled 2016-03-06: qty 70

## 2016-03-06 MED ORDER — ACETAMINOPHEN 325 MG PO TABS
650.0000 mg | ORAL_TABLET | Freq: Once | ORAL | Status: AC
  Administered 2016-03-06: 650 mg via ORAL
  Filled 2016-03-06: qty 2

## 2016-03-06 MED ORDER — DIPHENHYDRAMINE HCL 50 MG/ML IJ SOLN
12.5000 mg | Freq: Once | INTRAMUSCULAR | Status: AC
  Administered 2016-03-06: 12.5 mg via INTRAVENOUS
  Filled 2016-03-06: qty 1

## 2016-03-06 MED ORDER — SODIUM CHLORIDE 0.9 % IV SOLN: 375.0000 mg/m2 | Freq: Once | INTRAVENOUS | Status: DC

## 2016-03-06 MED ORDER — SODIUM CHLORIDE 0.9 % IV SOLN
Freq: Once | INTRAVENOUS | Status: AC
  Administered 2016-03-06: 09:00:00 via INTRAVENOUS
  Filled 2016-03-06: qty 1000

## 2016-03-06 MED ORDER — HYDROCORTISONE NA SUCCINATE PF 100 MG IJ SOLR
100.0000 mg | Freq: Once | INTRAMUSCULAR | Status: AC
  Administered 2016-03-06: 100 mg via INTRAVENOUS
  Filled 2016-03-06: qty 2

## 2016-03-13 ENCOUNTER — Inpatient Hospital Stay: Payer: Medicare Other | Attending: Oncology

## 2016-03-13 ENCOUNTER — Inpatient Hospital Stay: Payer: Medicare Other

## 2016-03-13 VITALS — BP 138/86 | HR 65 | Temp 96.8°F | Resp 18

## 2016-03-13 DIAGNOSIS — Z803 Family history of malignant neoplasm of breast: Secondary | ICD-10-CM | POA: Insufficient documentation

## 2016-03-13 DIAGNOSIS — I10 Essential (primary) hypertension: Secondary | ICD-10-CM | POA: Insufficient documentation

## 2016-03-13 DIAGNOSIS — Z79899 Other long term (current) drug therapy: Secondary | ICD-10-CM | POA: Diagnosis not present

## 2016-03-13 DIAGNOSIS — Z807 Family history of other malignant neoplasms of lymphoid, hematopoietic and related tissues: Secondary | ICD-10-CM | POA: Diagnosis not present

## 2016-03-13 DIAGNOSIS — E039 Hypothyroidism, unspecified: Secondary | ICD-10-CM | POA: Insufficient documentation

## 2016-03-13 DIAGNOSIS — R05 Cough: Secondary | ICD-10-CM | POA: Insufficient documentation

## 2016-03-13 DIAGNOSIS — K219 Gastro-esophageal reflux disease without esophagitis: Secondary | ICD-10-CM | POA: Insufficient documentation

## 2016-03-13 DIAGNOSIS — Z87442 Personal history of urinary calculi: Secondary | ICD-10-CM | POA: Insufficient documentation

## 2016-03-13 DIAGNOSIS — C884 Extranodal marginal zone B-cell lymphoma of mucosa-associated lymphoid tissue [MALT-lymphoma]: Secondary | ICD-10-CM | POA: Diagnosis not present

## 2016-03-13 DIAGNOSIS — F419 Anxiety disorder, unspecified: Secondary | ICD-10-CM | POA: Insufficient documentation

## 2016-03-13 DIAGNOSIS — M545 Low back pain: Secondary | ICD-10-CM | POA: Insufficient documentation

## 2016-03-13 DIAGNOSIS — Z5112 Encounter for antineoplastic immunotherapy: Secondary | ICD-10-CM | POA: Diagnosis present

## 2016-03-13 DIAGNOSIS — Z8041 Family history of malignant neoplasm of ovary: Secondary | ICD-10-CM | POA: Diagnosis not present

## 2016-03-13 DIAGNOSIS — R5383 Other fatigue: Secondary | ICD-10-CM | POA: Diagnosis not present

## 2016-03-13 DIAGNOSIS — R1900 Intra-abdominal and pelvic swelling, mass and lump, unspecified site: Secondary | ICD-10-CM | POA: Diagnosis not present

## 2016-03-13 DIAGNOSIS — Z800 Family history of malignant neoplasm of digestive organs: Secondary | ICD-10-CM | POA: Insufficient documentation

## 2016-03-13 DIAGNOSIS — Z87891 Personal history of nicotine dependence: Secondary | ICD-10-CM | POA: Diagnosis not present

## 2016-03-13 DIAGNOSIS — R531 Weakness: Secondary | ICD-10-CM | POA: Insufficient documentation

## 2016-03-13 DIAGNOSIS — C8590 Non-Hodgkin lymphoma, unspecified, unspecified site: Secondary | ICD-10-CM

## 2016-03-13 LAB — CBC WITH DIFFERENTIAL/PLATELET
BASOS ABS: 0.1 10*3/uL (ref 0–0.1)
BASOS PCT: 1 %
Eosinophils Absolute: 0.2 10*3/uL (ref 0–0.7)
Eosinophils Relative: 3 %
HEMATOCRIT: 40 % (ref 35.0–47.0)
Hemoglobin: 14 g/dL (ref 12.0–16.0)
LYMPHS PCT: 52 %
Lymphs Abs: 3.2 10*3/uL (ref 1.0–3.6)
MCH: 31.6 pg (ref 26.0–34.0)
MCHC: 34.9 g/dL (ref 32.0–36.0)
MCV: 90.7 fL (ref 80.0–100.0)
MONO ABS: 0.7 10*3/uL (ref 0.2–0.9)
Monocytes Relative: 11 %
NEUTROS ABS: 2.1 10*3/uL (ref 1.4–6.5)
NEUTROS PCT: 33 %
Platelets: 245 10*3/uL (ref 150–440)
RBC: 4.41 MIL/uL (ref 3.80–5.20)
RDW: 14.7 % — AB (ref 11.5–14.5)
WBC: 6.2 10*3/uL (ref 3.6–11.0)

## 2016-03-13 MED ORDER — SODIUM CHLORIDE 0.9 % IV SOLN
Freq: Once | INTRAVENOUS | Status: AC
  Administered 2016-03-13: 09:00:00 via INTRAVENOUS
  Filled 2016-03-13: qty 1000

## 2016-03-13 MED ORDER — DIPHENHYDRAMINE HCL 50 MG/ML IJ SOLN
12.5000 mg | Freq: Once | INTRAMUSCULAR | Status: AC
  Administered 2016-03-13: 12.5 mg via INTRAVENOUS
  Filled 2016-03-13: qty 1

## 2016-03-13 MED ORDER — SODIUM CHLORIDE 0.9 % IV SOLN
800.0000 mg | Freq: Once | INTRAVENOUS | Status: AC
  Administered 2016-03-13: 800 mg via INTRAVENOUS
  Filled 2016-03-13: qty 60

## 2016-03-13 MED ORDER — SODIUM CHLORIDE 0.9 % IV SOLN: 375.0000 mg/m2 | Freq: Once | INTRAVENOUS | Status: DC

## 2016-03-13 MED ORDER — ACETAMINOPHEN 325 MG PO TABS
650.0000 mg | ORAL_TABLET | Freq: Once | ORAL | Status: AC
  Administered 2016-03-13: 650 mg via ORAL
  Filled 2016-03-13: qty 2

## 2016-03-13 MED ORDER — HYDROCORTISONE NA SUCCINATE PF 100 MG IJ SOLR
100.0000 mg | Freq: Once | INTRAMUSCULAR | Status: AC
  Administered 2016-03-13: 100 mg via INTRAVENOUS
  Filled 2016-03-13: qty 2

## 2016-04-01 ENCOUNTER — Encounter: Payer: Self-pay | Admitting: Oncology

## 2016-04-01 ENCOUNTER — Inpatient Hospital Stay: Payer: Medicare Other

## 2016-04-01 ENCOUNTER — Inpatient Hospital Stay (HOSPITAL_BASED_OUTPATIENT_CLINIC_OR_DEPARTMENT_OTHER): Payer: Medicare Other | Admitting: Oncology

## 2016-04-01 VITALS — BP 150/74 | HR 65 | Temp 95.5°F | Resp 1 | Wt 223.1 lb

## 2016-04-01 DIAGNOSIS — Z79899 Other long term (current) drug therapy: Secondary | ICD-10-CM

## 2016-04-01 DIAGNOSIS — Z87891 Personal history of nicotine dependence: Secondary | ICD-10-CM

## 2016-04-01 DIAGNOSIS — Z8041 Family history of malignant neoplasm of ovary: Secondary | ICD-10-CM

## 2016-04-01 DIAGNOSIS — M545 Low back pain: Secondary | ICD-10-CM

## 2016-04-01 DIAGNOSIS — Z807 Family history of other malignant neoplasms of lymphoid, hematopoietic and related tissues: Secondary | ICD-10-CM

## 2016-04-01 DIAGNOSIS — R1900 Intra-abdominal and pelvic swelling, mass and lump, unspecified site: Secondary | ICD-10-CM | POA: Diagnosis not present

## 2016-04-01 DIAGNOSIS — Z800 Family history of malignant neoplasm of digestive organs: Secondary | ICD-10-CM

## 2016-04-01 DIAGNOSIS — E039 Hypothyroidism, unspecified: Secondary | ICD-10-CM

## 2016-04-01 DIAGNOSIS — Z5112 Encounter for antineoplastic immunotherapy: Secondary | ICD-10-CM | POA: Diagnosis not present

## 2016-04-01 DIAGNOSIS — C884 Extranodal marginal zone B-cell lymphoma of mucosa-associated lymphoid tissue [MALT-lymphoma]: Secondary | ICD-10-CM

## 2016-04-01 DIAGNOSIS — F419 Anxiety disorder, unspecified: Secondary | ICD-10-CM

## 2016-04-01 DIAGNOSIS — R05 Cough: Secondary | ICD-10-CM

## 2016-04-01 DIAGNOSIS — K219 Gastro-esophageal reflux disease without esophagitis: Secondary | ICD-10-CM

## 2016-04-01 DIAGNOSIS — R5383 Other fatigue: Secondary | ICD-10-CM | POA: Diagnosis not present

## 2016-04-01 DIAGNOSIS — Z87442 Personal history of urinary calculi: Secondary | ICD-10-CM

## 2016-04-01 DIAGNOSIS — C8590 Non-Hodgkin lymphoma, unspecified, unspecified site: Secondary | ICD-10-CM

## 2016-04-01 DIAGNOSIS — R531 Weakness: Secondary | ICD-10-CM

## 2016-04-01 DIAGNOSIS — Z803 Family history of malignant neoplasm of breast: Secondary | ICD-10-CM

## 2016-04-01 DIAGNOSIS — I10 Essential (primary) hypertension: Secondary | ICD-10-CM

## 2016-04-01 LAB — CBC WITH DIFFERENTIAL/PLATELET
BASOS ABS: 0.1 10*3/uL (ref 0–0.1)
Basophils Relative: 1 %
EOS PCT: 3 %
Eosinophils Absolute: 0.2 10*3/uL (ref 0–0.7)
HEMATOCRIT: 40.2 % (ref 35.0–47.0)
Hemoglobin: 13.9 g/dL (ref 12.0–16.0)
LYMPHS ABS: 3.5 10*3/uL (ref 1.0–3.6)
LYMPHS PCT: 50 %
MCH: 31.7 pg (ref 26.0–34.0)
MCHC: 34.5 g/dL (ref 32.0–36.0)
MCV: 91.9 fL (ref 80.0–100.0)
MONO ABS: 1 10*3/uL — AB (ref 0.2–0.9)
Monocytes Relative: 15 %
NEUTROS ABS: 2.3 10*3/uL (ref 1.4–6.5)
Neutrophils Relative %: 33 %
PLATELETS: 255 10*3/uL (ref 150–440)
RBC: 4.37 MIL/uL (ref 3.80–5.20)
RDW: 14.3 % (ref 11.5–14.5)
WBC: 7.1 10*3/uL (ref 3.6–11.0)

## 2016-04-01 LAB — COMPREHENSIVE METABOLIC PANEL
ALT: 17 U/L (ref 14–54)
AST: 22 U/L (ref 15–41)
Albumin: 4.3 g/dL (ref 3.5–5.0)
Alkaline Phosphatase: 97 U/L (ref 38–126)
Anion gap: 2 — ABNORMAL LOW (ref 5–15)
BILIRUBIN TOTAL: 0.7 mg/dL (ref 0.3–1.2)
BUN: 12 mg/dL (ref 6–20)
CO2: 27 mmol/L (ref 22–32)
CREATININE: 0.63 mg/dL (ref 0.44–1.00)
Calcium: 9.2 mg/dL (ref 8.9–10.3)
Chloride: 108 mmol/L (ref 101–111)
Glucose, Bld: 99 mg/dL (ref 65–99)
POTASSIUM: 4.6 mmol/L (ref 3.5–5.1)
Sodium: 137 mmol/L (ref 135–145)
TOTAL PROTEIN: 6.9 g/dL (ref 6.5–8.1)

## 2016-04-01 LAB — LACTATE DEHYDROGENASE: LDH: 175 U/L (ref 98–192)

## 2016-04-01 LAB — MAGNESIUM: MAGNESIUM: 2 mg/dL (ref 1.7–2.4)

## 2016-04-01 MED ORDER — FIRST-DUKES MOUTHWASH MT SUSP: 5.0000 mL | Freq: Four times a day (QID) | OROMUCOSAL | Status: DC | PRN

## 2016-04-01 MED ORDER — FLUCONAZOLE 100 MG PO TABS: 100.0000 mg | ORAL_TABLET | Freq: Every day | ORAL | Status: DC

## 2016-04-01 NOTE — Progress Notes (Signed)
Patient states she has a yeast infection and needs diflucan called in.  Also needs refill for [facility]ukes Mouthwash.  Would like a hard copy of prescriptions.

## 2016-04-01 NOTE — Progress Notes (Signed)
[place]  Telephone:(336) [phone] Fax:(336) [phone]  ID: Kaitlin Thompson OB: [birth year]  MR#: [id number]  [id number]  Patient Care Team: Tracie Harrier, MD as PCP - General (Internal Medicine) Benjaman Kindler, MD as Consulting Physician (Obstetrics and Gynecology)  1.  Oncology history Diagnosis of   low-grade B-cell lymphoma marginal zone.  From the left axillary lymph node biopsy in May of 2015 Patient had a weekly rituximab therapy in May 2015 2.  Progressive disease with enlargement of lymphadenopathy on a subsequent CT scan 3.  Right adnexal mass and followed by Dr. Leafy Ro and possibility of oophorectomy has been planned on March 3 of 2017 Oophorectomy has been canceled and patient was evaluated by GYN oncologist   Interval history 65 year old Kaitlin Thompson who switched oncologist from Dr. Grayland Ormond to me came today further follow-up.  Since last evaluation patient continues to feel weak and tired.  Let cough energy.  Continues to have low back pain and pain radiating down to up lower abdominal area.  She has finished total 4 cycles of rituximab therapy.  Tolerated treatment very well.  Night sweats. Patient has been evaluated by GYN oncologist.  Bilateral oophorectomy has been canceled. CA-125 remains within acceptable normal range Patient is here to discuss and further planning of treatment.  Accompanied by daughter Patient has started first cycle of rituximab therapy.  Patient was reevaluated today because of previous history of reaction to rituximab.  This time patient had some flushing but no other allergic reaction.  No chills.  No fever.  No rash.  No diarrhea.  Here for continuation of 3 more cycles of treatment REVIEW OF SYSTEMS:  Gen. status: Patient is feeling weak tired lack of energy.  Fatigue.  Somewhat depressed. No chills but night sweats. HEENT: Denies any headache no difficulty swallowing Skin: No rash Lungs: No shortness of breath. GI:  Lower abdominal discomfort.  No diarrhea.  No nausea or vomiting. GU: No dysuria or hematuria Musculoskeletal system: Low back pain and pain radiating in the front.  Continue sternal aching had an MRI scan in 2015. Neurological system: No headache no dizziness  ROSAs per HPI. Otherwise, a complete review of systems is negatve.  PAST MEDICAL HISTORY: Past Medical History  Diagnosis Date  . Hypertension   . Thyroid disease     hypothyroidism  . GERD (gastroesophageal reflux disease)   . Anxiety   . S/P partial hysterectomy   . History of appendectomy     30 plus yrs ago  . Kidney stones   . Non Hodgkin's lymphoma ([place]) 2015  . Hypothyroidism     PAST SURGICAL HISTORY: Past Surgical History  Procedure Laterality Date  . Abdominal hysterectomy    . Carpal tunnel release    . Cervical spine surgery    . Colonoscopy with propofol N/A 12/20/2015    Procedure: COLONOSCOPY WITH PROPOFOL;  Surgeon: Hulen Luster, MD;  Location: [facility] ENDOSCOPY;  Service: Gastroenterology;  Laterality: N/A;  . Esophagogastroduodenoscopy (egd) with propofol N/A 12/20/2015    Procedure: ESOPHAGOGASTRODUODENOSCOPY (EGD) WITH PROPOFOL;  Surgeon: Hulen Luster, MD;  Location: [facility] ENDOSCOPY;  Service: Gastroenterology;  Laterality: N/A;  . Appendectomy    . Joint replacement Left 01/2015    knee  . Tonsillectomy    . Axillary lymph node biopsy Left 2015    FAMILY HISTORY Family History  Problem Relation Age of Onset  . Cancer Other     breast cancer, colon cancer, lymphoma, ovarian cancer  . Diabetes Other   .  Anemia Other   . CAD Other   . Liver disease Other   . Breast cancer Daughter   . Lymphoma Mother   . Pancreatic cancer Father   . Liver cancer Father        ADVANCED DIRECTIVES:    HEALTH MAINTENANCE: Social History  Substance Use Topics  . Smoking status: Former Smoker -- 1.00 packs/day for 20 years    Quit date: 01/27/1994  . Smokeless tobacco: Never Used  . Alcohol Use: No       Allergies  Allergen Reactions  . Metoclopramide Other (See Comments)    Tremors  . Percocet [Oxycodone-Acetaminophen]   . Dicyclomine Anxiety    Tremors    Current Outpatient Prescriptions  Medication Sig Dispense Refill  . ALPRAZolam (XANAX) 0.25 MG tablet Take 0.25 mg by mouth at bedtime as needed for anxiety.    Marland Kitchen amitriptyline (ELAVIL) 25 MG tablet Take 25 mg by mouth as needed (for head aches).     Marland Kitchen aspirin 81 MG tablet Take 81 mg by mouth every morning. Reported on 02/06/2016    . atenolol (TENORMIN) 50 MG tablet Take 50 mg by mouth every morning.     . fluticasone (FLONASE) 50 MCG/ACT nasal spray Place into the nose as needed.     Marland Kitchen levothyroxine (SYNTHROID, LEVOTHROID) 112 MCG tablet Take 112 mcg by mouth daily before breakfast.    . omeprazole (PRILOSEC) 20 MG capsule Take 20 mg by mouth every morning.      No current facility-administered medications for this visit.    OBJECTIVE: Filed Vitals:   04/01/16 1103  BP: 150/74  Pulse: 65  Temp: 95.5 F (35.3 C)  Resp: 1     Body mass index is 38.28 kg/(m^2).    ECOG FS:0 - Asymptomatic  General: Well-developed, well-nourished, no acute distress. Somewhat depressed Eyes: anicteric sclera. Lungs: Clear to auscultation bilaterally. Heart: Regular rate and rhythm. No rubs, murmurs, or gallops. Abdomen: Soft, nontender, nondistended. No organomegaly noted, normoactive bowel sounds. Musculoskeletal: No edema, cyanosis, or clubbing. Neuro: Alert, answering all questions appropriately. Cranial nerves grossly intact. Skin: No rashes or petechiae noted. Psych: Normal affect. Lymphatics: Small palpable lymph node in the left-sided axillary area on her deep palpation No other palpable lymphadenopathy All other system have been examined.   LAB RESULTS:  Lab Results  Component Value Date   NA 137 04/01/2016   K 4.6 04/01/2016   CL 108 04/01/2016   CO2 27 04/01/2016   GLUCOSE 99 04/01/2016   BUN 12 04/01/2016    CREATININE 0.63 04/01/2016   CALCIUM 9.2 04/01/2016   PROT 6.9 04/01/2016   ALBUMIN 4.3 04/01/2016   AST 22 04/01/2016   ALT 17 04/01/2016   ALKPHOS 97 04/01/2016   BILITOT 0.7 04/01/2016   GFRNONAA >60 04/01/2016   GFRAA >60 04/01/2016    Lab Results  Component Value Date   WBC 7.1 04/01/2016   NEUTROABS 2.3 04/01/2016   HGB 13.9 04/01/2016   HCT 40.2 04/01/2016   MCV 91.9 04/01/2016   PLT 255 04/01/2016     STUDIES: No results found.  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 Patient tolerated first cycle of treatment very well.  We will proceed with 3 more cycles of rituximab and get a PET scan scheduled in 3 months for reevaluation.  I discussed that if we do not have significant improvement then possibility of chemotherapy can be considered.   Even though we usually  do not recommend any treatment for marginal zone lymphoma patient is becoming progressively symptomatic Back pain, enlarging adnexal masses, fatigue and weakness Night sweats Some of the symptoms can be related to lymphoma or there might be some independent other issues I suggested possibility of trying rituximab forced 4 cycles once a week and repeating PET scan on a CT scan. A. if adnexal mass does not resolve then possibility of oophorectomy can be considered B. A back pain does not get improved then MRI scan of thoracic and lumbar spine can be done to further evaluate for any other possible reasons for the back pain. C, fatigue and weakness it does not improve other causes can be looked into it.   Repeat CT scan of abdomen and pelvis with contrast for ovarian enlargement.in 3 months Also restaging of lymphoma  She has an appointment to see GYN oncologist after CT scan is done If  there is no significant response of lymphoma than biopsy would be done for reevaluation regarding pathology. Needle biopsy can be done on a core biopsy can be done with  a CT-guided procedure If ovarian   mass is not decreased  Then  oophorectomy can be planned        Patient expressed understanding and was in agreement with this plan. She also understands that She can call clinic at any time with any questions, concerns, or complaints.    Forest Gleason, MD   04/01/2016 11:27 AM

## 2016-04-07 ENCOUNTER — Encounter: Payer: Self-pay | Admitting: Oncology

## 2016-04-21 ENCOUNTER — Other Ambulatory Visit: Payer: Self-pay | Admitting: Internal Medicine

## 2016-04-21 DIAGNOSIS — Z1231 Encounter for screening mammogram for malignant neoplasm of breast: Secondary | ICD-10-CM

## 2016-04-29 ENCOUNTER — Other Ambulatory Visit: Payer: Self-pay | Admitting: Internal Medicine

## 2016-04-29 ENCOUNTER — Ambulatory Visit
Admission: RE | Admit: 2016-04-29 | Discharge: 2016-04-29 | Disposition: A | Discharge status: Home / Self Care | Payer: Medicare Other | Source: Ambulatory Visit | Attending: Internal Medicine | Admitting: Internal Medicine

## 2016-04-29 DIAGNOSIS — Z1231 Encounter for screening mammogram for malignant neoplasm of breast: Secondary | ICD-10-CM

## 2016-04-30 ENCOUNTER — Encounter: Payer: Self-pay | Admitting: Podiatry

## 2016-04-30 ENCOUNTER — Ambulatory Visit (INDEPENDENT_AMBULATORY_CARE_PROVIDER_SITE_OTHER): Payer: Medicare Other | Admitting: Podiatry

## 2016-04-30 VITALS — BP 128/72 | HR 72 | Resp 16

## 2016-04-30 DIAGNOSIS — G5762 Lesion of plantar nerve, left lower limb: Secondary | ICD-10-CM | POA: Diagnosis not present

## 2016-04-30 DIAGNOSIS — G5761 Lesion of plantar nerve, right lower limb: Secondary | ICD-10-CM | POA: Diagnosis not present

## 2016-04-30 DIAGNOSIS — G588 Other specified mononeuropathies: Secondary | ICD-10-CM

## 2016-04-30 NOTE — Progress Notes (Signed)
She presents today for follow-up of neuromas bilaterally. She states that think in another set of injections. They worked for very long time but now is starting to come back again. I'm not sure but I think up her shoes may have upset them.  Objective: Vital signs are stable alert and oriented 3 pulses are palpable bilateral. Palpable Mulder's click third interdigital space bilateral.  Assessment: Neuroma third interdigital space bilateral.  Plan: Injected another dose of dehydrated alcohol to the third interdigital space bilaterally. Follow-up with her in 3 weeks if necessary.

## 2016-05-21 ENCOUNTER — Encounter: Payer: Self-pay | Admitting: Podiatry

## 2016-05-21 ENCOUNTER — Ambulatory Visit (INDEPENDENT_AMBULATORY_CARE_PROVIDER_SITE_OTHER): Payer: Medicare Other | Admitting: Podiatry

## 2016-05-21 ENCOUNTER — Ambulatory Visit: Payer: Medicare Other

## 2016-05-21 DIAGNOSIS — G5762 Lesion of plantar nerve, left lower limb: Secondary | ICD-10-CM

## 2016-05-21 DIAGNOSIS — G5761 Lesion of plantar nerve, right lower limb: Secondary | ICD-10-CM | POA: Diagnosis not present

## 2016-05-21 DIAGNOSIS — G588 Other specified mononeuropathies: Secondary | ICD-10-CM

## 2016-05-21 NOTE — Progress Notes (Signed)
She presents today 3 weeks status post injection neuroma third interdigital space bilateral. She states they really haven't gotten any better.  Objective: Vital signs are stable she's alert and oriented 3 she still has pain on palpation third interdigital space with a palpable Mulder's click.  Assessment: All signs point to neuroma third interdigital space bilateral.  Plan: I reinjected her third injurytoday with dehydrated alcohol will follow up with her in 1 months. This will be her third dose recently.

## 2016-06-25 ENCOUNTER — Ambulatory Visit (INDEPENDENT_AMBULATORY_CARE_PROVIDER_SITE_OTHER): Payer: Medicare Other

## 2016-06-25 ENCOUNTER — Ambulatory Visit: Payer: Medicare Other

## 2016-06-25 ENCOUNTER — Encounter: Payer: Self-pay | Admitting: Podiatry

## 2016-06-25 ENCOUNTER — Ambulatory Visit (INDEPENDENT_AMBULATORY_CARE_PROVIDER_SITE_OTHER): Payer: Medicare Other | Admitting: Podiatry

## 2016-06-25 ENCOUNTER — Encounter: Payer: Self-pay | Admitting: Obstetrics and Gynecology

## 2016-06-25 ENCOUNTER — Inpatient Hospital Stay: Payer: Medicare Other | Attending: Obstetrics and Gynecology | Admitting: Obstetrics and Gynecology

## 2016-06-25 ENCOUNTER — Other Ambulatory Visit: Payer: Self-pay | Admitting: Internal Medicine

## 2016-06-25 VITALS — BP 153/88 | HR 77 | Temp 98.0°F | Ht 65.0 in | Wt 223.8 lb

## 2016-06-25 DIAGNOSIS — E039 Hypothyroidism, unspecified: Secondary | ICD-10-CM | POA: Diagnosis not present

## 2016-06-25 DIAGNOSIS — Z7982 Long term (current) use of aspirin: Secondary | ICD-10-CM | POA: Insufficient documentation

## 2016-06-25 DIAGNOSIS — R5383 Other fatigue: Secondary | ICD-10-CM | POA: Diagnosis not present

## 2016-06-25 DIAGNOSIS — F419 Anxiety disorder, unspecified: Secondary | ICD-10-CM

## 2016-06-25 DIAGNOSIS — R0600 Dyspnea, unspecified: Secondary | ICD-10-CM | POA: Diagnosis not present

## 2016-06-25 DIAGNOSIS — D3911 Neoplasm of uncertain behavior of right ovary: Secondary | ICD-10-CM | POA: Diagnosis present

## 2016-06-25 DIAGNOSIS — Z79899 Other long term (current) drug therapy: Secondary | ICD-10-CM

## 2016-06-25 DIAGNOSIS — N838 Other noninflammatory disorders of ovary, fallopian tube and broad ligament: Secondary | ICD-10-CM

## 2016-06-25 DIAGNOSIS — R102 Pelvic and perineal pain: Secondary | ICD-10-CM | POA: Diagnosis not present

## 2016-06-25 DIAGNOSIS — C8510 Unspecified B-cell lymphoma, unspecified site: Secondary | ICD-10-CM | POA: Diagnosis not present

## 2016-06-25 DIAGNOSIS — K5790 Diverticulosis of intestine, part unspecified, without perforation or abscess without bleeding: Secondary | ICD-10-CM | POA: Insufficient documentation

## 2016-06-25 DIAGNOSIS — M5116 Intervertebral disc disorders with radiculopathy, lumbar region: Secondary | ICD-10-CM | POA: Insufficient documentation

## 2016-06-25 DIAGNOSIS — M4726 Other spondylosis with radiculopathy, lumbar region: Secondary | ICD-10-CM | POA: Diagnosis not present

## 2016-06-25 DIAGNOSIS — R52 Pain, unspecified: Secondary | ICD-10-CM | POA: Diagnosis not present

## 2016-06-25 DIAGNOSIS — I10 Essential (primary) hypertension: Secondary | ICD-10-CM | POA: Insufficient documentation

## 2016-06-25 DIAGNOSIS — M7662 Achilles tendinitis, left leg: Secondary | ICD-10-CM

## 2016-06-25 DIAGNOSIS — G588 Other specified mononeuropathies: Secondary | ICD-10-CM

## 2016-06-25 DIAGNOSIS — G5761 Lesion of plantar nerve, right lower limb: Secondary | ICD-10-CM

## 2016-06-25 DIAGNOSIS — Z87891 Personal history of nicotine dependence: Secondary | ICD-10-CM | POA: Insufficient documentation

## 2016-06-25 DIAGNOSIS — G5762 Lesion of plantar nerve, left lower limb: Secondary | ICD-10-CM

## 2016-06-25 DIAGNOSIS — R61 Generalized hyperhidrosis: Secondary | ICD-10-CM | POA: Diagnosis not present

## 2016-06-25 DIAGNOSIS — K219 Gastro-esophageal reflux disease without esophagitis: Secondary | ICD-10-CM | POA: Diagnosis not present

## 2016-06-25 DIAGNOSIS — R109 Unspecified abdominal pain: Secondary | ICD-10-CM | POA: Diagnosis not present

## 2016-06-25 NOTE — Progress Notes (Signed)
  Oncology Nurse Navigator Documentation  Navigator Location: [facility] (06/25/16 1400) Navigator Encounter Type: Clinic/MDC (06/25/16 1400)                                          Time Spent with Patient: 30 (06/25/16 1400)   Chaperoned pelvic exam. Spoke with Dr Rogue Bussing, will change CT abdomen and pelvis to a PET scan for restaging lymphoma.

## 2016-06-25 NOTE — Progress Notes (Signed)
She presents today for follow-up of her neuropathy and neuromas to the third interdigital space bilaterally. She states that really they have not improved very much at all and I have now started to experience pain to the Achilles area of my left heel. She states is present for several days but seems to be worse since the weekend. She denies any trauma.  Objective: Vital signs are stable she is alert and oriented 3. Pulses are palpable. Neurologic sensorium is intact. Palpable Mulder's click third interdigital space bilateral. Consistent with neuroma. She also has tenderness on palpation of the Achilles left.  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  Plan: I injected subcutaneously today with dexamethasone and local anesthetic to the pre-Achilles bursa of the left heel. I also injected her third dose of dehydrated alcohol to the third interdigital space bilateral. He also place her night splint for the left foot. We discussed appropriate shoe gear dressing exercises and ice therapy. Follow up with her in 3-4 weeks area

## 2016-06-25 NOTE — Progress Notes (Signed)
Patient here for follow up complaining of lower abdominal pain.

## 2016-06-25 NOTE — Progress Notes (Addendum)
Gynecologic Oncology Visit   Referring Provider: Dr. Forest Gleason  Primary Gynecologist:  Benjaman Kindler, MD  Chief Concern: Right ovarian mass  Subjective:  Kaitlin Thompson is a 65 y.o. female who was initially seen in consultation from Dr. Oliva Bustard for evaluation of solid right ovarian mass in the setting of known low-grade B-cell lymphoma marginal zone. Since she was last seen she was started on rituximab with Dr. Oliva Bustard.     She has multiple complaints as reviewed below.   Gynecologic Oncology History Kaitlin Thompson is a pleasant female who was initially seen in consultation from Dr. Oliva Bustard for evaluation of solid right ovarian mass in the setting of known low-grade B-cell lymphoma marginal zone.   Her history of low-grade B-cell lymphoma marginal zone is as follows. She was diagnosed in May of 2015. She did have one month of rituxamab therapy in July due to increase constitutional symptoms per her report. She has subsequently been followed with imaging. On 12/14/2015 she was noted to have progressive disease with enlargement of lymphadenopathy on a subsequent CT scan and worsening symptoms. Dr. Oliva Bustard is considering therapy.   She has also been followed by Dr. Leafy Ro for a right adnexal mass and discussed possible oophorectomy. Her images for lymphoma therapy have demonstrated the follow findings with regard to the mass.   08/29/2014: Soft tissue density within the right paracentral pelvis is unchanged at 3.6 cm maximally and likely right ovarian in origin. Similar soft tissue density in the right paracentral pelvis. Favor right ovarian origin.   06/06/2015: Rounded mass within the right mid pelvis measures 3.4 by 4.5 cm compared to 3.3 by 3.9 cm. This mass was not significantly metabolic on prior PET-CT scan.  08/08/2015: Unremarkable left ovary and stable 39 mm right adnexal mass. This mass roughly doubled since 2006. No new enlargement of the right ovary to strongly suggest torsion  Unremarkable left ovary and stable 39 mm right adnexal mass.   12/14/2015 A soft tissue density within the right hemipelvis is favored to arise from the right ovary. 3.9 x 3.4 cm on image 99/series 2. Similar to on the 06/06/2015 exam (when remeasured).  01/28/2016 Ultrasound with Dr. Leafy Ro revealed ovaries remain enlarged. R ovary/adnexal mass unchanged at 4cm. L ovary/adnexal mass enlarged from volume of 8cm3 9/29 to 16cm3. No internal flow or shadowing, but heterogenous mass.   Lab Results  Component Value Date   CA125 20.7 01/28/2016    She presented to Gyn Onc to discuss management options. After review with Dr. Oliva Bustard the decision was made to proceed with lymphoma therapy with rituximab.    Problem List: Patient Active Problem List   Diagnosis Date Noted  . Adnexal mass 01/28/2016  . Anxiety 01/28/2016  . Arthritis 01/28/2016  . Adenopathy 01/28/2016  . Headache, migraine 01/28/2016  . Adult hypothyroidism 01/28/2016  . BP (high blood pressure) 01/28/2016  . DD (diverticular disease) 01/28/2016  . Acid reflux 01/28/2016  . Degeneration of intervertebral disc of lumbar region 11/06/2015  . Non-Hodgkin's lymphoma ([place]) 11/29/2014  . Degenerative arthritis of lumbar spine 04/27/2014  . Lumbar canal stenosis 04/27/2014  . Neuritis or radiculitis due to rupture of lumbar intervertebral disc 04/27/2014    Past Medical History: Past Medical History  Diagnosis Date  . Hypertension   . Thyroid disease     hypothyroidism  . GERD (gastroesophageal reflux disease)   . Anxiety   . S/P partial hysterectomy   . History of appendectomy     30 plus  yrs ago  . Kidney stones   . Non Hodgkin's lymphoma ([place]) 2015  . Hypothyroidism     Past Surgical History: Past Surgical History  Procedure Laterality Date  . Abdominal hysterectomy    . Carpal tunnel release    . Cervical spine surgery    . Colonoscopy with propofol N/A 12/20/2015    Procedure: COLONOSCOPY WITH PROPOFOL;   Surgeon: Hulen Luster, MD;  Location[facility] ENDOSCOPY;  Service: Gastroenterology;  Laterality: N/A;  . Esophagogastroduodenoscopy (egd) with propofol N/A 12/20/2015    Procedure: ESOPHAGOGASTRODUODENOSCOPY (EGD) WITH PROPOFOL;  Surgeon: Hulen Luster, MD;  Location: [facility] ENDOSCOPY;  Service: Gastroenterology;  Laterality: N/A;  . Appendectomy    . Joint replacement Left 01/2015    knee  . Tonsillectomy    . Axillary lymph node biopsy Left 2015  . Breast biopsy Right 07/15/1995    stereotactic biopsy at [facility]    Past Gynecologic History:  No history of abnormal Pap smears. S/p hysterectomy.   OB History:  OB History  Gravida Para Term Preterm AB SAB TAB Ectopic Multiple Living  1             # Outcome Date GA Lbr Len/2nd Weight Sex Delivery Anes PTL Lv  1 Gravida               Family History: Family History  Problem Relation Age of Onset  . Cancer Other     breast cancer, colon cancer, lymphoma, ovarian cancer  . Diabetes Other   . Anemia Other   . CAD Other   . Liver disease Other   . Breast cancer Daughter   . Lymphoma Mother   . Pancreatic cancer Father   . Liver cancer Father     Social History: Social History   Social History  . Marital Status: Married    Spouse Name: N/A  . Number of Children: N/A  . Years of Education: N/A   Occupational History  . Not on file.   Social History Main Topics  . Smoking status: Former Smoker -- 1.00 packs/day for 20 years    Quit date: 01/27/1994  . Smokeless tobacco: Never Used  . Alcohol Use: No  . Drug Use: No  . Sexual Activity: Yes   Other Topics Concern  . Not on file   Social History Narrative    Allergies: Allergies  Allergen Reactions  . Metoclopramide Other (See Comments)    Tremors  . Percocet [Oxycodone-Acetaminophen]   . Dicyclomine Anxiety    Tremors    Current Medications: Current Outpatient Prescriptions  Medication Sig Dispense Refill  . ALPRAZolam (XANAX) 0.25 MG tablet Take 0.25 mg by mouth at  bedtime as needed for anxiety.    Marland Kitchen amitriptyline (ELAVIL) 25 MG tablet Take 25 mg by mouth as needed (for head aches).     Marland Kitchen aspirin 81 MG tablet Take 81 mg by mouth every morning. Reported on 02/06/2016    . atenolol (TENORMIN) 50 MG tablet Take 50 mg by mouth every morning.     . Diphenhyd-Hydrocort-Nystatin (FIRST-DUKES MOUTHWASH) SUSP Use as directed 5 mLs in the mouth or throat 4 (four) times daily as needed. 237 mL 3  . fluticasone (FLONASE) 50 MCG/ACT nasal spray Place into the nose as needed.     Marland Kitchen levothyroxine (SYNTHROID, LEVOTHROID) 112 MCG tablet Take 112 mcg by mouth daily before breakfast.    . omeprazole (PRILOSEC) 20 MG capsule Take 20 mg by mouth every morning.  No current facility-administered medications for this visit.    Review of Systems General: fatigue, changes in weight or appetite, lymph node swelling, sweating and feels hot Skin: negative Eyes: negative HEENT: negative Breasts: negative Pulmonary: dyspnea Cardiac: negative Gastrointestinal: pain Genitourinary/Sexual: negative for, dysuria, retention, stones, infections Ob/Gyn: pelvic pain Musculoskeletal: pain, swelling Hematology: easy bruising Neurologic/Psych: weakness, numbness/tingling  Objective:  Physical Examination:  There were no vitals taken for this visit.   ECOG Performance Status: 1 - Symptomatic but completely ambulatory  General appearance: alert, cooperative and appears stated age HEENT:PERRLA, extra ocular movement intact and sclera clear, anicteric Abdomen: soft, non-tender, without masses or organomegaly, nondistended, no hernias and well healed incision Back: inspection of back is normal Extremities: extremities normal, atraumatic, no cyanosis or edema Neurological exam reveals alert, oriented, normal speech, no focal findings or movement disorder noted.  Pelvic: exam chaperoned by nurse;  Vulva: normal appearing vulva with no masses, tenderness or lesions; Vagina: normal  vagina; Adnexa:no tenderness or mass palpated. At her last visit I thought I could palpate a right mass at the vaginal cuff but difficult to delineate; Uterus: surgically absent, vaginal cuff well healed; Cervix: absent; Rectal: not indicated   Radiologic Imaging: Reviewed prior films    Assessment:  Kaitlin Thompson is a 65 y.o. female diagnosed with  solid right ovarian mass in the setting of known low-grade B-cell lymphoma marginal zone with constitutional symptoms and imaging c/w progressive lymphoma. Her symptoms have not improved since starting rituximab therapy.   Medical co-morbidities complicating care: lymphoma, HTN, obesity and prior abdominal surgery.  Plan:   Problem List Items Addressed This Visit    None    Visit Diagnoses    Ovarian mass    -  Primary       She is scheduled for a CT of the abdomen and pelvis. We will check with Dr. Rogue Bussing to confirm the type of test she will receive (CT CAP vs PET).   We will follow the results and determine subsequent management based on these findings.   Return to clinic prn and pending the imaging results.  Gillis Ends, MD  ADDENDUM: 07/11/2016 I reviewed PET imaging. I have asked our team to please set up appt for Ms. Ghio to see either Dr Fransisca Connors in August or she can see me in September. Dr. Rogue Bussing has reviewed PET scan results, and our team will as well with focus on the right pelvic lesion. She a persistent solid right pelvic mass with low level PET scan. We would like to see her in clinic and the plan is to discuss surgery, specifically BSO, versus observation. She seems to have a lot of pain including pelvic pain, but I am not certain if the mass is the etiology. We will explore that when she comes to clinic. If symptomatic then surgery would definitely be recommended.  Gillis Ends, MD   CC:  Referring Provider: Dr. Charlaine Dalton  Primary Gynecologist:  Benjaman Kindler, MD

## 2016-07-01 ENCOUNTER — Ambulatory Visit: Payer: Medicare Other

## 2016-07-01 ENCOUNTER — Ambulatory Visit: Admission: RE | Admit: 2016-07-01 | Payer: Medicare Other | Source: Ambulatory Visit

## 2016-07-01 ENCOUNTER — Other Ambulatory Visit: Payer: Self-pay | Admitting: *Deleted

## 2016-07-01 DIAGNOSIS — C8200 Follicular lymphoma grade I, unspecified site: Secondary | ICD-10-CM

## 2016-07-02 ENCOUNTER — Ambulatory Visit
Admission: RE | Admit: 2016-07-02 | Discharge: 2016-07-02 | Disposition: A | Discharge status: Home / Self Care | Payer: Medicare Other | Source: Ambulatory Visit | Attending: Internal Medicine | Admitting: Internal Medicine

## 2016-07-02 ENCOUNTER — Ambulatory Visit: Payer: Medicare Other

## 2016-07-02 ENCOUNTER — Inpatient Hospital Stay (HOSPITAL_BASED_OUTPATIENT_CLINIC_OR_DEPARTMENT_OTHER): Payer: Medicare Other | Admitting: Internal Medicine

## 2016-07-02 DIAGNOSIS — N200 Calculus of kidney: Secondary | ICD-10-CM | POA: Diagnosis not present

## 2016-07-02 DIAGNOSIS — R0600 Dyspnea, unspecified: Secondary | ICD-10-CM

## 2016-07-02 DIAGNOSIS — K760 Fatty (change of) liver, not elsewhere classified: Secondary | ICD-10-CM | POA: Insufficient documentation

## 2016-07-02 DIAGNOSIS — I700 Atherosclerosis of aorta: Secondary | ICD-10-CM | POA: Insufficient documentation

## 2016-07-02 DIAGNOSIS — E039 Hypothyroidism, unspecified: Secondary | ICD-10-CM

## 2016-07-02 DIAGNOSIS — C8588 Other specified types of non-Hodgkin lymphoma, lymph nodes of multiple sites: Secondary | ICD-10-CM

## 2016-07-02 DIAGNOSIS — K219 Gastro-esophageal reflux disease without esophagitis: Secondary | ICD-10-CM

## 2016-07-02 DIAGNOSIS — Z7982 Long term (current) use of aspirin: Secondary | ICD-10-CM

## 2016-07-02 DIAGNOSIS — C8510 Unspecified B-cell lymphoma, unspecified site: Secondary | ICD-10-CM | POA: Diagnosis not present

## 2016-07-02 DIAGNOSIS — R61 Generalized hyperhidrosis: Secondary | ICD-10-CM

## 2016-07-02 DIAGNOSIS — N839 Noninflammatory disorder of ovary, fallopian tube and broad ligament, unspecified: Secondary | ICD-10-CM | POA: Insufficient documentation

## 2016-07-02 DIAGNOSIS — I10 Essential (primary) hypertension: Secondary | ICD-10-CM

## 2016-07-02 DIAGNOSIS — R109 Unspecified abdominal pain: Secondary | ICD-10-CM

## 2016-07-02 DIAGNOSIS — R5383 Other fatigue: Secondary | ICD-10-CM | POA: Diagnosis not present

## 2016-07-02 DIAGNOSIS — D3911 Neoplasm of uncertain behavior of right ovary: Secondary | ICD-10-CM

## 2016-07-02 DIAGNOSIS — N838 Other noninflammatory disorders of ovary, fallopian tube and broad ligament: Secondary | ICD-10-CM

## 2016-07-02 DIAGNOSIS — R102 Pelvic and perineal pain: Secondary | ICD-10-CM

## 2016-07-02 DIAGNOSIS — I2510 Atherosclerotic heart disease of native coronary artery without angina pectoris: Secondary | ICD-10-CM | POA: Diagnosis not present

## 2016-07-02 DIAGNOSIS — K5790 Diverticulosis of intestine, part unspecified, without perforation or abscess without bleeding: Secondary | ICD-10-CM

## 2016-07-02 DIAGNOSIS — M5116 Intervertebral disc disorders with radiculopathy, lumbar region: Secondary | ICD-10-CM

## 2016-07-02 DIAGNOSIS — Z87891 Personal history of nicotine dependence: Secondary | ICD-10-CM

## 2016-07-02 DIAGNOSIS — F419 Anxiety disorder, unspecified: Secondary | ICD-10-CM

## 2016-07-02 DIAGNOSIS — M4726 Other spondylosis with radiculopathy, lumbar region: Secondary | ICD-10-CM

## 2016-07-02 DIAGNOSIS — Z79899 Other long term (current) drug therapy: Secondary | ICD-10-CM

## 2016-07-02 LAB — GLUCOSE, CAPILLARY: Glucose-Capillary: 110 mg/dL — ABNORMAL HIGH (ref 65–99)

## 2016-07-02 MED ORDER — FLUDEOXYGLUCOSE F - 18 (FDG) INJECTION
13.3000 | Freq: Once | INTRAVENOUS | Status: AC | PRN
  Administered 2016-07-02: 13.3 via INTRAVENOUS

## 2016-07-02 NOTE — Progress Notes (Signed)
Patient here today for PET results. 

## 2016-07-02 NOTE — Assessment & Plan Note (Addendum)
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 Awaiting repeat PET scan done this morning  # Lower abdominal pain- unclear etiology  # right ovarian mass- Gyn/gyn Onc. Reviewed the note from Dr.Secord; await above PET scan.  PH: preffered- [id number] h; cell- [phone]. [vacation on 4th of aug]; follow up based on PET .

## 2016-07-02 NOTE — Progress Notes (Signed)
[facility] OFFICE PROGRESS NOTE  Patient Care Team: Tracie Harrier, MD as PCP - General (Internal Medicine) Benjaman Kindler, MD as Consulting Physician (Obstetrics and Gynecology)  No matching staging information was found for the patient.   Oncology History   Diagnosis of   low-grade B-cell lymphoma marginal zone.  From the left axillary lymph node biopsy in May of 2015 Patient had a weekly rituximab therapy in May 2015 2.  Progressive disease with enlargement of lymphadenopathy on a subsequent CT scan;APril 2017- Rituxan weekly x4 3.  Right adnexal mass and followed by Dr. 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 I reviewed the patient's prior charts/pertinent labs/imaging in detail; findings are summarized above.   Kaitlin Thompson 65 y.o.  female pleasant patient above history of marginal zone lymphoma recurrent status post most recently treated in March April 2017; and also history of right ovarian mass is here for follow-up. Patient had a PET scan this morning.  Patient continues to complain of chronic lower abdominal pain; back pain. This is not improving. She also complains of chronic drenching sweats- worse in the last 2 months. No fevers. Appetite is good. No weight loss. Denies any new lumps or bumps. No chest pain or shortness of breath or cough.  REVIEW OF SYSTEMS:  A complete 10 point review of system is done which is negative except mentioned above/history of present illness.   PAST MEDICAL HISTORY :  Past Medical History:  Diagnosis Date  . Anxiety   .  GERD (gastroesophageal reflux disease)   . History of appendectomy    30 plus yrs ago  . Hypertension   . Hypothyroidism   . Kidney stones   . Non Hodgkin's lymphoma ([place]) 2015  . S/P partial hysterectomy   . Thyroid disease    hypothyroidism    PAST SURGICAL HISTORY :   Past Surgical History:  Procedure Laterality Date  . ABDOMINAL HYSTERECTOMY    . APPENDECTOMY    . AXILLARY LYMPH NODE BIOPSY Left 2015  . BREAST BIOPSY Right 07/15/1995   stereotactic biopsy at [facility]  . CARPAL TUNNEL RELEASE    . CERVICAL SPINE SURGERY    . COLONOSCOPY WITH PROPOFOL N/A 12/20/2015   Procedure: COLONOSCOPY WITH PROPOFOL;  Surgeon: Hulen Luster, MD;  Location: [facility] ENDOSCOPY;  Service: Gastroenterology;  Laterality: N/A;  . ESOPHAGOGASTRODUODENOSCOPY (EGD) WITH PROPOFOL N/A 12/20/2015   Procedure: ESOPHAGOGASTRODUODENOSCOPY (EGD) WITH PROPOFOL;  Surgeon: Hulen Luster, MD;  Location: [facility] ENDOSCOPY;  Service: Gastroenterology;  Laterality: N/A;  . JOINT REPLACEMENT Left 01/2015   knee  . TONSILLECTOMY      FAMILY HISTORY :   Family History  Problem Relation Age of Onset  . Cancer Other     breast cancer, colon cancer, lymphoma, ovarian cancer  . Diabetes Other   . Anemia Other   . CAD Other   . Liver disease Other   . Breast cancer Daughter   . Lymphoma Mother   . Pancreatic cancer Father   . Liver cancer Father  SOCIAL HISTORY:   Social History  Substance Use Topics  . Smoking status: Former Smoker    Packs/day: 1.00    Years: 20.00    Quit date: 01/27/1994  . Smokeless tobacco: Never Used  . Alcohol use No    ALLERGIES:  is allergic to metoclopramide; percocet [oxycodone-acetaminophen]; and dicyclomine.  MEDICATIONS:  Current Outpatient Prescriptions  Medication Sig Dispense Refill  . ALPRAZolam (XANAX) 0.25 MG tablet Take 0.25 mg by mouth at bedtime as needed for anxiety.    Marland Kitchen amitriptyline (ELAVIL) 25 MG tablet Take 25 mg by mouth as needed (for head aches).     Marland Kitchen aspirin 81 MG  tablet Take 81 mg by mouth every morning. Reported on 02/06/2016    . atenolol (TENORMIN) 50 MG tablet Take 50 mg by mouth every morning.     . Diphenhyd-Hydrocort-Nystatin (FIRST-DUKES MOUTHWASH) SUSP Use as directed 5 mLs in the mouth or throat 4 (four) times daily as needed. 237 mL 3  . fluticasone (FLONASE) 50 MCG/ACT nasal spray Place into the nose as needed.     Marland Kitchen levothyroxine (SYNTHROID, LEVOTHROID) 112 MCG tablet Take 112 mcg by mouth daily before breakfast.    . omeprazole (PRILOSEC) 20 MG capsule Take 20 mg by mouth every morning.      No current facility-administered medications for this visit.     PHYSICAL EXAMINATION: ECOG PERFORMANCE STATUS: 0 - Asymptomatic  BP (!) 149/83 (BP Location: Left Arm, Patient Position: Sitting)   Pulse 90   Temp 97.6 F (36.4 C) (Tympanic)   Resp 18   Wt 223 lb (101.2 kg)   BMI 37.11 kg/m   Filed Weights   07/02/16 1004  Weight: 223 lb (101.2 kg)    GENERAL: Well-nourished well-developed; Alert, no distress and comfortable.   Obese. Accompanied by her husband. EYES: no pallor or icterus OROPHARYNX: no thrush or ulceration; good dentition  NECK: supple, no masses felt LYMPH:  no palpable lymphadenopathy in the cervical, axillary. Approximately 1 cm sized lymph nodes felt in the left inguinal region. LUNGS: clear to auscultation and  No wheeze or crackles HEART/CVS: regular rate & rhythm and no murmurs; No lower extremity edema ABDOMEN:abdomen soft, non-tender and normal bowel sounds Musculoskeletal:no cyanosis of digits and no clubbing  PSYCH: alert & oriented x 3 with fluent speech NEURO: no focal motor/sensory deficits SKIN:  no rashes or significant lesions  LABORATORY DATA:  I have reviewed the data as listed    Component Value Date/Time   NA 137 04/01/2016 1021   NA 139 01/08/2015 1058   K 4.6 04/01/2016 1021   K 4.3 01/08/2015 1058   CL 108 04/01/2016 1021   CL 107 01/08/2015 1058   CO2 27 04/01/2016 1021   CO2 26  01/08/2015 1058   GLUCOSE 99 04/01/2016 1021   GLUCOSE 96 01/08/2015 1058   BUN 12 04/01/2016 1021   BUN 11 01/08/2015 1058   CREATININE 0.63 04/01/2016 1021   CREATININE 0.63 01/08/2015 1058   CALCIUM 9.2 04/01/2016 1021   CALCIUM 9.3 01/08/2015 1058   PROT 6.9 04/01/2016 1021   PROT 7.0 07/11/2014 1010   ALBUMIN 4.3 04/01/2016 1021   ALBUMIN 3.6 07/11/2014 1010   AST 22 04/01/2016 1021   AST 23 07/11/2014 1010   ALT 17 04/01/2016 1021   ALT 25 07/11/2014 1010   ALKPHOS 97 04/01/2016 1021   ALKPHOS 84 07/11/2014 1010   BILITOT 0.7 04/01/2016 1021   BILITOT 0.4 07/11/2014 1010   GFRNONAA >60 04/01/2016  [place] >60 01/08/2015 1058   GFRNONAA >60 07/11/2014 1010   GFRAA >60 04/01/2016 1021   GFRAA >60 01/08/2015 1058   GFRAA >60 07/11/2014 1010    No results found for: SPEP, UPEP  Lab Results  Component Value Date   WBC 7.1 04/01/2016   NEUTROABS 2.3 04/01/2016   HGB 13.9 04/01/2016   HCT 40.2 04/01/2016   MCV 91.9 04/01/2016   PLT 255 04/01/2016      Chemistry      Component Value Date/Time   NA 137 04/01/2016 1021   NA 139 01/08/2015 1058   K 4.6 04/01/2016 1021   K 4.3 01/08/2015 1058   CL 108 04/01/2016 1021   CL 107 01/08/2015 1058   CO2 27 04/01/2016 1021   CO2 26 01/08/2015 1058   BUN 12 04/01/2016 1021   BUN 11 01/08/2015 1058   CREATININE 0.63 04/01/2016 1021   CREATININE 0.63 01/08/2015 1058      Component Value Date/Time   CALCIUM 9.2 04/01/2016 1021   CALCIUM 9.3 01/08/2015 1058   ALKPHOS 97 04/01/2016 1021   ALKPHOS 84 07/11/2014 1010   AST 22 04/01/2016 1021   AST 23 07/11/2014 1010   ALT 17 04/01/2016 1021   ALT 25 07/11/2014 1010   BILITOT 0.7 04/01/2016 1021   BILITOT 0.4 07/11/2014 1010         ASSESSMENT & PLAN:  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 Awaiting repeat PET scan done this morning  # Lower  abdominal pain- unclear etiology  # right ovarian mass- Gyn/gyn Onc. Reviewed the note from Dr.Secord; await above PET scan.  PH: preffered- [id number] h; cell- [phone]. [vacation on 4th of aug]; follow up based on PET .   No orders of the defined types were placed in this encounter.  All questions were answered. The patient knows to call the clinic with any problems, questions or concerns. Reviewed the recent labs from her PCP's office. Normal limits.     Cammie Sickle, MD 07/02/2016 1:45 PM

## 2016-07-04 ENCOUNTER — Ambulatory Visit: Payer: Medicare Other | Admitting: Internal Medicine

## 2016-07-07 ENCOUNTER — Telehealth: Payer: Self-pay

## 2016-07-07 NOTE — Telephone Encounter (Signed)
Patient would like PET scan results, can leave message

## 2016-07-09 ENCOUNTER — Telehealth: Payer: Self-pay | Admitting: Internal Medicine

## 2016-07-09 ENCOUNTER — Telehealth: Payer: Self-pay

## 2016-07-09 NOTE — Telephone Encounter (Signed)
Patient would like results of PET scan that was done on 07/02/16.  Please call [phone]

## 2016-07-09 NOTE — Telephone Encounter (Signed)
Left a message for the patient that the PET scan- shows no progression of the lymph nodes; probable benign ovarian mass. No new recommendations at this time.

## 2016-07-21 ENCOUNTER — Ambulatory Visit: Payer: Medicare Other | Admitting: Podiatry

## 2016-07-23 ENCOUNTER — Inpatient Hospital Stay: Payer: Medicare Other | Attending: Obstetrics and Gynecology | Admitting: Obstetrics and Gynecology

## 2016-07-23 ENCOUNTER — Encounter (INDEPENDENT_AMBULATORY_CARE_PROVIDER_SITE_OTHER): Payer: Self-pay

## 2016-07-23 VITALS — BP 147/90 | HR 66 | Temp 97.6°F | Ht 65.0 in | Wt 222.4 lb

## 2016-07-23 DIAGNOSIS — D3911 Neoplasm of uncertain behavior of right ovary: Secondary | ICD-10-CM | POA: Diagnosis not present

## 2016-07-23 DIAGNOSIS — E039 Hypothyroidism, unspecified: Secondary | ICD-10-CM | POA: Diagnosis not present

## 2016-07-23 DIAGNOSIS — M5116 Intervertebral disc disorders with radiculopathy, lumbar region: Secondary | ICD-10-CM | POA: Diagnosis not present

## 2016-07-23 DIAGNOSIS — M4800 Spinal stenosis, site unspecified: Secondary | ICD-10-CM | POA: Diagnosis not present

## 2016-07-23 DIAGNOSIS — Z7982 Long term (current) use of aspirin: Secondary | ICD-10-CM | POA: Diagnosis not present

## 2016-07-23 DIAGNOSIS — M47816 Spondylosis without myelopathy or radiculopathy, lumbar region: Secondary | ICD-10-CM

## 2016-07-23 DIAGNOSIS — Z8572 Personal history of non-Hodgkin lymphomas: Secondary | ICD-10-CM | POA: Diagnosis not present

## 2016-07-23 DIAGNOSIS — Z90710 Acquired absence of both cervix and uterus: Secondary | ICD-10-CM | POA: Diagnosis not present

## 2016-07-23 DIAGNOSIS — Z87891 Personal history of nicotine dependence: Secondary | ICD-10-CM | POA: Diagnosis not present

## 2016-07-23 DIAGNOSIS — N9489 Other specified conditions associated with female genital organs and menstrual cycle: Secondary | ICD-10-CM

## 2016-07-23 DIAGNOSIS — M549 Dorsalgia, unspecified: Secondary | ICD-10-CM | POA: Diagnosis not present

## 2016-07-23 DIAGNOSIS — Z9221 Personal history of antineoplastic chemotherapy: Secondary | ICD-10-CM | POA: Insufficient documentation

## 2016-07-23 DIAGNOSIS — R1031 Right lower quadrant pain: Secondary | ICD-10-CM | POA: Insufficient documentation

## 2016-07-23 DIAGNOSIS — Z79899 Other long term (current) drug therapy: Secondary | ICD-10-CM | POA: Diagnosis not present

## 2016-07-23 DIAGNOSIS — I10 Essential (primary) hypertension: Secondary | ICD-10-CM | POA: Diagnosis not present

## 2016-07-23 DIAGNOSIS — K219 Gastro-esophageal reflux disease without esophagitis: Secondary | ICD-10-CM | POA: Diagnosis not present

## 2016-07-23 NOTE — Progress Notes (Signed)
Patient here for follow up. Has complaints of lower abdominal pain. Constant about a level 1 "most" days.

## 2016-07-23 NOTE — Progress Notes (Signed)
Gynecologic Oncology Consult Visit   Referring Provider: Dr. Forest Gleason  Primary Gynecologist:  Benjaman Kindler, MD  Chief Concern: Right ovarian mass  Subjective:  Kaitlin Thompson is a 65 y.o. female who is seen in consultation from Dr. Oliva Bustard for evaluation of solid right ovarian mass in the setting of known low-grade B-cell lymphoma marginal zone. Prior hysterectomy over 30 years ago.  She received additional 4 cycles of Rituxumab 4/17 and PET 7/17 shows some response. See report below.  Plan is for expectant management for now.  This is low grade lymphoma and she has a long life expectancy.  She has persistent 3.7 cm mass in region of right ovary and does have some mild discomfort in the RLQ.  This is chronic and not worsening.  Also has some back pain and plans to see Ortho for evaluation. Also has pain shooting down legs and has seen pain specialist at [facility] clinic for alcohol injections in feet.  She feels that the pain and poor mobility are a major issue with QOL. She uses occasional oxycodone and tylenol. Says NSAIDS do not help.   07/02/16 PET/CT IMPRESSION: 1. Compare to 12/14/2015 CT, decreased size of nodes within the chest and pelvis, consistent with response to therapy. Compared to most recent PET of 08/29/2014, overall similar low-level hypermetabolism within these nodes. 2. Similar size of a right pelvic lesion (3.7 cm) which is again favored to be ovarian in origin. This demonstrates similar low-level hypermetabolism compared to 2015. Favor a benign ovarian neoplasm.  HPI Her history of low-grade B-cell lymphoma marginal zone is as follows. She was diagnosed in May of 2015. She did have one month of rituxamab therapy in July due to increase constitutional symptoms per her report. She has subsequently been followed with imaging. On 12/14/2015 she was noted to have progressive disease with enlargement of lymphadenopathy on a subsequent CT scan and worsening symptoms. Dr.  Oliva Bustard is considering therapy.   Concurrently the patient has also been followed by Dr. Leafy Ro for a right adnexal mass and discussed possible oophorectomy. Her images for lymphoma therapy have demonstrated the follow findings with regard to the mass.   08/29/2014: Soft tissue density within the right paracentral pelvis is unchanged at 3.6 cm maximally and likely right ovarian in origin. Similar soft tissue density in the right paracentral pelvis. Favor right ovarian origin.   06/06/2015: Rounded mass within the right mid pelvis measures 3.4 by 4.5 cm compared to 3.3 by 3.9 cm. This mass was not significantly metabolic on prior PET-CT scan.  08/08/2015: Unremarkable left ovary and stable 39 mm right adnexal mass. This mass roughly doubled since 2006. No new enlargement of the right ovary to strongly suggest torsion Unremarkable left ovary and stable 39 mm right adnexal mass.   12/14/2015 A soft tissue density within the right hemipelvis is favored to arise from the right ovary. 3.9 x 3.4 cm on image 99/series 2. Similar to on the 06/06/2015 exam (when remeasured).  01/28/2016 Ultrasound with Dr. Leafy Ro revealed ovaries remain enlarged. R ovary/adnexal mass unchanged at 4cm. L ovary/adnexal mass enlarged from volume of 8cm3 9/29 to 16cm3 today. No internal flow or shadowing, but heterogenous mass.   Lab Results  Component Value Date   CA125 20.7 01/28/2016    She presents today to discuss management options. She is complaining of fatigue, weight gain, night sweats, axillary/back/pelvic pain.     Problem List: Patient Active Problem List   Diagnosis Date Noted  . Marginal zone lymphoma  of lymph nodes of multiple sites ([place]) 07/02/2016  . Adnexal mass 01/28/2016  . Anxiety 01/28/2016  . Arthritis 01/28/2016  . Adenopathy 01/28/2016  . Headache, migraine 01/28/2016  . Adult hypothyroidism 01/28/2016  . BP (high blood pressure) 01/28/2016  . DD (diverticular disease) 01/28/2016  .  Acid reflux 01/28/2016  . Degeneration of intervertebral disc of lumbar region 11/06/2015  . Non-Hodgkin's lymphoma ([place]) 11/29/2014  . Degenerative arthritis of lumbar spine 04/27/2014  . Lumbar canal stenosis 04/27/2014  . Neuritis or radiculitis due to rupture of lumbar intervertebral disc 04/27/2014    Past Medical History: Past Medical History:  Diagnosis Date  . Anxiety   . GERD (gastroesophageal reflux disease)   . History of appendectomy    30 plus yrs ago  . Hypertension   . Hypothyroidism   . Kidney stones   . Non Hodgkin's lymphoma ([place]) 2015  . S/P partial hysterectomy   . Thyroid disease    hypothyroidism    Past Surgical History: Past Surgical History:  Procedure Laterality Date  . ABDOMINAL HYSTERECTOMY    . APPENDECTOMY    . AXILLARY LYMPH NODE BIOPSY Left 2015  . BREAST BIOPSY Right 07/15/1995   stereotactic biopsy at [facility]  . CARPAL TUNNEL RELEASE    . CERVICAL SPINE SURGERY    . COLONOSCOPY WITH PROPOFOL N/A 12/20/2015   Procedure: COLONOSCOPY WITH PROPOFOL;  Surgeon: Hulen Luster, MD;  Location[facility] ENDOSCOPY;  Service: Gastroenterology;  Laterality: N/A;  . ESOPHAGOGASTRODUODENOSCOPY (EGD) WITH PROPOFOL N/A 12/20/2015   Procedure: ESOPHAGOGASTRODUODENOSCOPY (EGD) WITH PROPOFOL;  Surgeon: Hulen Luster, MD;  Location[facility] ENDOSCOPY;  Service: Gastroenterology;  Laterality: N/A;  . JOINT REPLACEMENT Left 01/2015   knee  . TONSILLECTOMY      Past Gynecologic History:  No history of abnormal Pap smears. S/p hysterectomy.   OB History:  OB History  Gravida Para Term Preterm AB Living  1            SAB TAB Ectopic Multiple Live Births               # Outcome Date GA Lbr Len/2nd Weight Sex Delivery Anes PTL Lv  1 Gravida               Family History: Family History  Problem Relation Age of Onset  . Cancer Other     breast cancer, colon cancer, lymphoma, ovarian cancer  . Diabetes Other   . Anemia Other   . CAD Other   . Liver disease Other   .  Breast cancer Daughter   . Lymphoma Mother   . Pancreatic cancer Father   . Liver cancer Father     Social History: Social History   Social History  . Marital status: Married    Spouse name: N/A  . Number of children: N/A  . Years of education: N/A   Occupational History  . Not on file.   Social History Main Topics  . Smoking status: Former Smoker    Packs/day: 1.00    Years: 20.00    Quit date: 01/27/1994  . Smokeless tobacco: Never Used  . Alcohol use No  . Drug use: No  . Sexual activity: Yes   Other Topics Concern  . Not on file   Social History Narrative  . No narrative on file    Allergies: Allergies  Allergen Reactions  . Metoclopramide Other (See Comments)    Tremors  . Percocet [Oxycodone-Acetaminophen]   . Dicyclomine Anxiety  Tremors    Current Medications: Current Outpatient Prescriptions  Medication Sig Dispense Refill  . ALPRAZolam (XANAX) 0.25 MG tablet Take 0.25 mg by mouth at bedtime as needed for anxiety.    Marland Kitchen amitriptyline (ELAVIL) 25 MG tablet Take 25 mg by mouth as needed (for head aches).     Marland Kitchen aspirin 81 MG tablet Take 81 mg by mouth every morning. Reported on 02/06/2016    . atenolol (TENORMIN) 50 MG tablet Take 50 mg by mouth every morning.     . Diphenhyd-Hydrocort-Nystatin (FIRST-DUKES MOUTHWASH) SUSP Use as directed 5 mLs in the mouth or throat 4 (four) times daily as needed. 237 mL 3  . fluticasone (FLONASE) 50 MCG/ACT nasal spray Place into the nose as needed.     Marland Kitchen levothyroxine (SYNTHROID, LEVOTHROID) 112 MCG tablet Take 112 mcg by mouth daily before breakfast.    . omeprazole (PRILOSEC) 20 MG capsule Take 20 mg by mouth every morning.      No current facility-administered medications for this visit.     Review of Systems General: fatigue, changes in weight or appetite Skin: negative Eyes: negative HEENT: negative Breasts: negative Pulmonary: dyspnea Cardiac: negative Gastrointestinal: nausea Genitourinary/Sexual:  negative for, dysuria, retention, stones, infections Ob/Gyn: negative for, irregular bleeding, pain Musculoskeletal: pain Hematology: easy bruising Neurologic/Psych: weakness, depression  Objective:  Physical Examination:  BP (!) 147/90 (BP Location: Left Arm, Patient Position: Sitting)   Pulse 66   Temp 97.6 F (36.4 C) (Tympanic)   Ht 5\' 5"  (1.651 m)   Wt 222 lb 7.1 oz (100.9 kg)   BMI 37.02 kg/m    ECOG Performance Status: 1 - Symptomatic but completely ambulatory  General appearance: alert, cooperative and appears stated age HEENT:PERRLA, extra ocular movement intact and sclera clear, anicteric Lymph node survey: palpable left axillary adenopathy o/w non-palpable, axillary, inguinal, supraclavicular Cardiovascular: regular rate and rhythm Respiratory: normal air entry, lungs clear to auscultation Breast exam: breasts appear normal, no suspicious masses, no skin or nipple changes or axillary nodes. Abdomen: soft, non-tender, without masses or organomegaly, nondistended, no hernias and well healed incision Back: inspection of back is normal Extremities: extremities normal, atraumatic, no cyanosis or edema Skin exam - normal coloration and turgor, no rashes, no suspicious skin lesions noted. Neurological exam reveals alert, oriented, normal speech, no focal findings or movement disorder noted.  Pelvic: exam chaperoned by nurse;  Vulva: normal appearing vulva with no masses, tenderness or lesions; Vagina: normal vagina; Adnexa: tenderness right with possible firm mass at the vaginal cuff but difficult to delineate; Uterus: surgically absent, vaginal cuff well healed; Cervix: absent; Rectal: not indicated   Radiologic Imaging: Reviewed prior films    Assessment:  Kaitlin Thompson is a 65 y.o. female diagnosed with  3.7 cm solid right ovarian mass in the setting of known low-grade B-cell lymphoma marginal zone: mass stable since 2015 on imaging. Lymphoma has responded again to  Rituxumab and plan is for expectant management.  Her ovarian mass could be benign ovarian tumor or involvement with lymphoma, but if so this will not be a determinant of her outcome since it is assumed that there is disease elsewhere.  On close questioning the ovarian mass is not causing much pain.  Her main issue presently is low back pain due to spinal stenosis and bulging discs.  Pain and poor mobility are a major issue with QOL. She has an appt to be seen at the [place] [facility] in [place].     Medical  co-morbidities complicating care: lymphoma, HTN, obesity and prior abdominal surgery.  Plan:   Problem List Items Addressed This Visit      Other   Adnexal mass - Primary    Other Visit Diagnoses   None.    We discussed options for management of ovarian mass. Given that she has minimal symptoms and the mass is stable for over 2 years, surgical evaluation is not imperitive. Her main disability presently is related to her low back pain. She agrees that she will proceed with her appointment with the spine specialist for management.    Return to clinic in 6 months for evaluation with exam and pelvic US.    Mellody Drown, MD  CC:  Referring Provider: Dr. Rogue Bussing  Primary Gynecologist:  Benjaman Kindler, MD

## 2016-07-23 NOTE — Progress Notes (Signed)
  Oncology Nurse Navigator Documentation  Navigator Location: [facility] (07/23/16 1100) Navigator Encounter Type: [facility] Follow-up (07/23/16 1100)                                          Time Spent with Patient: 15 (07/23/16 1100)   Chaperoned pelvic exam. Follow in 6 months with U/S in Gyn clinic and Dr Rogue Bussing in 3 months.

## 2016-10-23 ENCOUNTER — Inpatient Hospital Stay: Payer: Medicare Other | Attending: Internal Medicine

## 2016-10-23 ENCOUNTER — Inpatient Hospital Stay (HOSPITAL_BASED_OUTPATIENT_CLINIC_OR_DEPARTMENT_OTHER): Payer: Medicare Other | Admitting: Internal Medicine

## 2016-10-23 VITALS — BP 153/93 | HR 94 | Temp 97.4°F | Resp 18 | Wt 222.6 lb

## 2016-10-23 DIAGNOSIS — Z87891 Personal history of nicotine dependence: Secondary | ICD-10-CM | POA: Diagnosis not present

## 2016-10-23 DIAGNOSIS — Z803 Family history of malignant neoplasm of breast: Secondary | ICD-10-CM | POA: Insufficient documentation

## 2016-10-23 DIAGNOSIS — R1903 Right lower quadrant abdominal swelling, mass and lump: Secondary | ICD-10-CM | POA: Diagnosis not present

## 2016-10-23 DIAGNOSIS — I2510 Atherosclerotic heart disease of native coronary artery without angina pectoris: Secondary | ICD-10-CM | POA: Diagnosis not present

## 2016-10-23 DIAGNOSIS — I700 Atherosclerosis of aorta: Secondary | ICD-10-CM

## 2016-10-23 DIAGNOSIS — Z79899 Other long term (current) drug therapy: Secondary | ICD-10-CM

## 2016-10-23 DIAGNOSIS — N200 Calculus of kidney: Secondary | ICD-10-CM

## 2016-10-23 DIAGNOSIS — I10 Essential (primary) hypertension: Secondary | ICD-10-CM | POA: Diagnosis not present

## 2016-10-23 DIAGNOSIS — Z90710 Acquired absence of both cervix and uterus: Secondary | ICD-10-CM | POA: Diagnosis not present

## 2016-10-23 DIAGNOSIS — Z9049 Acquired absence of other specified parts of digestive tract: Secondary | ICD-10-CM | POA: Insufficient documentation

## 2016-10-23 DIAGNOSIS — K760 Fatty (change of) liver, not elsewhere classified: Secondary | ICD-10-CM | POA: Diagnosis not present

## 2016-10-23 DIAGNOSIS — Z7982 Long term (current) use of aspirin: Secondary | ICD-10-CM

## 2016-10-23 DIAGNOSIS — K219 Gastro-esophageal reflux disease without esophagitis: Secondary | ICD-10-CM

## 2016-10-23 DIAGNOSIS — F419 Anxiety disorder, unspecified: Secondary | ICD-10-CM

## 2016-10-23 DIAGNOSIS — M549 Dorsalgia, unspecified: Secondary | ICD-10-CM | POA: Insufficient documentation

## 2016-10-23 DIAGNOSIS — Z8041 Family history of malignant neoplasm of ovary: Secondary | ICD-10-CM | POA: Insufficient documentation

## 2016-10-23 DIAGNOSIS — C8200 Follicular lymphoma grade I, unspecified site: Secondary | ICD-10-CM

## 2016-10-23 DIAGNOSIS — R109 Unspecified abdominal pain: Secondary | ICD-10-CM | POA: Diagnosis not present

## 2016-10-23 DIAGNOSIS — E039 Hypothyroidism, unspecified: Secondary | ICD-10-CM

## 2016-10-23 DIAGNOSIS — C8588 Other specified types of non-Hodgkin lymphoma, lymph nodes of multiple sites: Secondary | ICD-10-CM

## 2016-10-23 DIAGNOSIS — Z800 Family history of malignant neoplasm of digestive organs: Secondary | ICD-10-CM | POA: Insufficient documentation

## 2016-10-23 LAB — COMPREHENSIVE METABOLIC PANEL
ALBUMIN: 4.4 g/dL (ref 3.5–5.0)
ALK PHOS: 89 U/L (ref 38–126)
ALT: 18 U/L (ref 14–54)
ANION GAP: 7 (ref 5–15)
AST: 23 U/L (ref 15–41)
BUN: 14 mg/dL (ref 6–20)
CO2: 26 mmol/L (ref 22–32)
Calcium: 9.4 mg/dL (ref 8.9–10.3)
Chloride: 104 mmol/L (ref 101–111)
Creatinine, Ser: 0.67 mg/dL (ref 0.44–1.00)
GFR calc Af Amer: >60 mL/min (ref >60)
GFR calc non Af Amer: >60 mL/min (ref >60)
GLUCOSE: 125 mg/dL — AB (ref 65–99)
POTASSIUM: 3.7 mmol/L (ref 3.5–5.1)
SODIUM: 137 mmol/L (ref 135–145)
Total Bilirubin: 0.9 mg/dL (ref 0.3–1.2)
Total Protein: 7.1 g/dL (ref 6.5–8.1)

## 2016-10-23 LAB — CBC WITH DIFFERENTIAL/PLATELET
BASOS ABS: 0.1 10*3/uL (ref 0–0.1)
BASOS PCT: 1 %
EOS ABS: 0.1 10*3/uL (ref 0–0.7)
Eosinophils Relative: 2 %
HCT: 42.8 % (ref 35.0–47.0)
HEMOGLOBIN: 14.6 g/dL (ref 12.0–16.0)
Lymphocytes Relative: 59 %
Lymphs Abs: 3.6 10*3/uL (ref 1.0–3.6)
MCH: 31.7 pg (ref 26.0–34.0)
MCHC: 34.2 g/dL (ref 32.0–36.0)
MCV: 92.6 fL (ref 80.0–100.0)
MONOS PCT: 12 %
Monocytes Absolute: 0.7 10*3/uL (ref 0.2–0.9)
NEUTROS PCT: 26 %
Neutro Abs: 1.6 10*3/uL (ref 1.4–6.5)
Platelets: 258 10*3/uL (ref 150–440)
RBC: 4.62 MIL/uL (ref 3.80–5.20)
RDW: 14.1 % (ref 11.5–14.5)
WBC: 6 10*3/uL (ref 3.6–11.0)

## 2016-10-23 LAB — LACTATE DEHYDROGENASE: LDH: 164 U/L (ref 98–192)

## 2016-10-23 LAB — MAGNESIUM: Magnesium: 1.8 mg/dL (ref 1.7–2.4)

## 2016-10-23 NOTE — Progress Notes (Signed)
[place] OFFICE PROGRESS NOTE  Patient Care Team: Tracie Harrier, MD as PCP - General (Internal Medicine) Benjaman Kindler, MD as Consulting Physician (Obstetrics and Gynecology) Clent Jacks, RN as Registered Nurse  No matching staging information was found for the patient.   Oncology History   # MAY 2015- Diagnosis of   low-grade B-cell lymphoma marginal zone.  From the left axillary lymph node biopsy.  Patient had a weekly rituximab therapy in May 2015 # April 2017-   Progressive disease with enlargement of lymphadenopathy on a subsequent CT scan;APril 2017- Rituxan weekly x4  #  Right adnexal mass and followed by Dr. Retia Passe for 2 years]; monitored.       Marginal zone lymphoma of lymph nodes of multiple sites ([place])   07/02/2016 Initial Diagnosis    Marginal zone lymphoma of lymph nodes of multiple sites [facility])        INTERVAL HISTORY:  Kaitlin Thompson 65 y.o.  female pleasant patient above history of marginal zone lymphoma recurrent status post most recently treated in March April 2017; and also history of right ovarian mass is here for follow-up.  In interim she has met with gynecology oncology for her right adnexal mass. Surgery is currently on hold.  Patient continues to complain of chronic lower abdominal pain; back pain. This is not improving. She is planned to undergo spinal injections. No fevers. Appetite is good. No weight loss. Denies any new lumps or bumps. No chest pain or shortness of breath or cough.  REVIEW OF SYSTEMS:  A complete 10 point review of system is done which is negative except mentioned above/history of present illness.   PAST MEDICAL HISTORY :  Past Medical History:  Diagnosis Date  . Anxiety   . GERD (gastroesophageal reflux disease)   . History of appendectomy    30 plus yrs ago  . Hypertension   . Hypothyroidism   . Kidney stones   . Non Hodgkin's lymphoma ([place]) 2015  . S/P partial hysterectomy    . Thyroid disease    hypothyroidism    PAST SURGICAL HISTORY :   Past Surgical History:  Procedure Laterality Date  . ABDOMINAL HYSTERECTOMY    . APPENDECTOMY    . AXILLARY LYMPH NODE BIOPSY Left 2015  . BREAST BIOPSY Right 07/15/1995   stereotactic biopsy at [facility]  . CARPAL TUNNEL RELEASE    . CERVICAL SPINE SURGERY    . COLONOSCOPY WITH PROPOFOL N/A 12/20/2015   Procedure: COLONOSCOPY WITH PROPOFOL;  Surgeon: Hulen Luster, MD;  Location: [facility] ENDOSCOPY;  Service: Gastroenterology;  Laterality: N/A;  . ESOPHAGOGASTRODUODENOSCOPY (EGD) WITH PROPOFOL N/A 12/20/2015   Procedure: ESOPHAGOGASTRODUODENOSCOPY (EGD) WITH PROPOFOL;  Surgeon: Hulen Luster, MD;  Location: [facility] ENDOSCOPY;  Service: Gastroenterology;  Laterality: N/A;  . JOINT REPLACEMENT Left 01/2015   knee  . TONSILLECTOMY      FAMILY HISTORY :   Family History  Problem Relation Age of Onset  . Cancer Other     breast cancer, colon cancer, lymphoma, ovarian cancer  . Diabetes Other   . Anemia Other   . CAD Other   . Liver disease Other   . Breast cancer Daughter   . Lymphoma Mother   . Pancreatic cancer Father   . Liver cancer Father     SOCIAL HISTORY:   Social History  Substance Use Topics  . Smoking status: Former Smoker    Packs/day: 1.00    Years: 20.00    Quit  date: 01/27/1994  . Smokeless tobacco: Never Used  . Alcohol use No    ALLERGIES:  is allergic to metoclopramide and percocet [oxycodone-acetaminophen].  MEDICATIONS:  Current Outpatient Prescriptions  Medication Sig Dispense Refill  . ALPRAZolam (XANAX) 0.25 MG tablet Take 0.25 mg by mouth at bedtime as needed for anxiety.    Marland Kitchen amitriptyline (ELAVIL) 25 MG tablet Take 25 mg by mouth as needed (for head aches).     Marland Kitchen aspirin 81 MG tablet Take 81 mg by mouth every morning. Reported on 02/06/2016    . ciprofloxacin (CIPRO) 500 MG tablet Take by mouth.    . fluticasone (FLONASE) 50 MCG/ACT nasal spray Place into the nose as needed.     Marland Kitchen levothyroxine  (SYNTHROID, LEVOTHROID) 112 MCG tablet Take 112 mcg by mouth daily before breakfast.    . omeprazole (PRILOSEC) 20 MG capsule Take 20 mg by mouth every morning.     . Diphenhyd-Hydrocort-Nystatin (FIRST-DUKES MOUTHWASH) SUSP Use as directed 5 mLs in the mouth or throat 4 (four) times daily as needed. (Patient not taking: Reported on 10/23/2016) 237 mL 3   No current facility-administered medications for this visit.     PHYSICAL EXAMINATION: ECOG PERFORMANCE STATUS: 0 - Asymptomatic  BP (!) 153/93 (BP Location: Right Arm, Patient Position: Sitting)   Pulse 94   Temp 97.4 F (36.3 C) (Tympanic)   Resp 18   Wt 222 lb 9.6 oz (101 kg)   BMI 37.04 kg/m   Filed Weights   10/23/16 1003  Weight: 222 lb 9.6 oz (101 kg)    GENERAL: Well-nourished well-developed; Alert, no distress and comfortable.   Obese. Accompanied by her husband. EYES: no pallor or icterus OROPHARYNX: no thrush or ulceration; good dentition  NECK: supple, no masses felt LYMPH:  no palpable lymphadenopathy in the cervical, axillary. Approximately 1 cm sized lymph nodes felt in the left inguinal region. LUNGS: clear to auscultation and  No wheeze or crackles HEART/CVS: regular rate & rhythm and no murmurs; No lower extremity edema ABDOMEN:abdomen soft, non-tender and normal bowel sounds Musculoskeletal:no cyanosis of digits and no clubbing  PSYCH: alert & oriented x 3 with fluent speech NEURO: no focal motor/sensory deficits SKIN:  no rashes or significant lesions  LABORATORY DATA:  I have reviewed the data as listed    Component Value Date/Time   NA 137 10/23/2016 0935   NA 139 01/08/2015 1058   K 3.7 10/23/2016 0935   K 4.3 01/08/2015 1058   CL 104 10/23/2016 0935   CL 107 01/08/2015 1058   CO2 26 10/23/2016 0935   CO2 26 01/08/2015 1058   GLUCOSE 125 (H) 10/23/2016 0935   GLUCOSE 96 01/08/2015 1058   BUN 14 10/23/2016 0935   BUN 11 01/08/2015 1058   CREATININE 0.67 10/23/2016 0935   CREATININE 0.63  01/08/2015 1058   CALCIUM 9.4 10/23/2016 0935   CALCIUM 9.3 01/08/2015 1058   PROT 7.1 10/23/2016 0935   PROT 7.0 07/11/2014 1010   ALBUMIN 4.4 10/23/2016 0935   ALBUMIN 3.6 07/11/2014 1010   AST 23 10/23/2016 0935   AST 23 07/11/2014 1010   ALT 18 10/23/2016 0935   ALT 25 07/11/2014 1010   ALKPHOS 89 10/23/2016 0935   ALKPHOS 84 07/11/2014 1010   BILITOT 0.9 10/23/2016 0935   BILITOT 0.4 07/11/2014 1010   GFRNONAA >60 10/23/2016 0935   GFRNONAA >60 01/08/2015 1058   GFRNONAA >60 07/11/2014 1010   GFRAA >60 10/23/2016 0935   GFRAA >60 01/08/2015 1058  GFRAA >60 07/11/2014 1010    No results found for: SPEP, UPEP  Lab Results  Component Value Date   WBC 6.0 10/23/2016   NEUTROABS 1.6 10/23/2016   HGB 14.6 10/23/2016   HCT 42.8 10/23/2016   MCV 92.6 10/23/2016   PLT 258 10/23/2016      Chemistry      Component Value Date/Time   NA 137 10/23/2016 0935   NA 139 01/08/2015 1058   K 3.7 10/23/2016 0935   K 4.3 01/08/2015 1058   CL 104 10/23/2016 0935   CL 107 01/08/2015 1058   CO2 26 10/23/2016 0935   CO2 26 01/08/2015 1058   BUN 14 10/23/2016 0935   BUN 11 01/08/2015 1058   CREATININE 0.67 10/23/2016 0935   CREATININE 0.63 01/08/2015 1058      Component Value Date/Time   CALCIUM 9.4 10/23/2016 0935   CALCIUM 9.3 01/08/2015 1058   ALKPHOS 89 10/23/2016 0935   ALKPHOS 84 07/11/2014 1010   AST 23 10/23/2016 0935   AST 23 07/11/2014 1010   ALT 18 10/23/2016 0935   ALT 25 07/11/2014 1010   BILITOT 0.9 10/23/2016 0935   BILITOT 0.4 07/11/2014 1010     IMPRESSION: 1. Compare to 12/14/2015 CT, decreased size of nodes within the chest and pelvis, consistent with response to therapy. Compared to most recent PET of 08/29/2014, overall similar low-level hypermetabolism within these nodes. 2. Similar size of a right pelvic lesion which is again favored to be ovarian in origin. This demonstrates similar low-level hypermetabolism compared to 2015. Favor a benign  ovarian neoplasm. 3.  Coronary artery atherosclerosis. Aortic atherosclerosis. 4. Hepatic steatosis. 5. Right nephrolithiasis. Electronically Signed   By: Abigail Miyamoto M.D.   On: 07/03/2016 14:17     ASSESSMENT & PLAN:  Marginal zone lymphoma of lymph nodes of multiple sites [facility]) # Marginal Zone lymphoma-recurrent most recently treated in March/April 2017 with Rituxan. Last PET scan July 2017- improving.   # Lower abdominal pain- unclear etiology  # right ovarian mass- Gyn/gyn Onc.pt wants to hold off US/ sec to discomfort.   # Follow up in 2nd week of Feb 2018/lab; PET scan 1 week before.   Orders Placed This Encounter  Procedures  . NM PET Image Restag (PS) Skull Base To Thigh    Standing Status:   Future    Standing Expiration Date:   12/23/2017    Order Specific Question:   Reason for Exam (SYMPTOM  OR DIAGNOSIS REQUIRED)    Answer:   lymphoma    Order Specific Question:   Preferred imaging location?    Answer:   [facility]  . CBC with Differential    Standing Status:   Future    Standing Expiration Date:   10/23/2017  . Comprehensive metabolic panel    Standing Status:   Future    Standing Expiration Date:   10/23/2017  . Lactate dehydrogenase    Standing Status:   Future    Standing Expiration Date:   10/23/2017   All questions were answered. The patient knows to call the clinic with any problems, questions or concerns. Reviewed the recent labs from her PCP's office. Normal limits.     Cammie Sickle, MD 10/23/2016 2:29 PM

## 2016-10-23 NOTE — Progress Notes (Signed)
Patient is here for follow up she is doing well.

## 2016-10-23 NOTE — Assessment & Plan Note (Addendum)
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 Last PET scan July 2017- improving.   # Lower abdominal pain- unclear etiology  # right ovarian mass- Gyn/gyn Onc.pt wants to hold off US/ sec to discomfort.   # Follow up in 2nd week of Feb 2018/lab; PET scan 1 week before.

## 2016-11-12 ENCOUNTER — Encounter: Payer: Self-pay | Admitting: Podiatry

## 2016-11-12 ENCOUNTER — Ambulatory Visit (INDEPENDENT_AMBULATORY_CARE_PROVIDER_SITE_OTHER): Payer: Medicare Other | Admitting: Podiatry

## 2016-11-12 DIAGNOSIS — M7662 Achilles tendinitis, left leg: Secondary | ICD-10-CM

## 2016-11-12 DIAGNOSIS — G5760 Lesion of plantar nerve, unspecified lower limb: Secondary | ICD-10-CM | POA: Diagnosis not present

## 2016-11-12 DIAGNOSIS — G588 Other specified mononeuropathies: Secondary | ICD-10-CM

## 2016-11-12 NOTE — Progress Notes (Signed)
She presents today for follow-up of her Achilles tendinitis and states that she is still having some Achilles heel pain.she is also having pain to the third interdigital space bilateral.  Objective: Vital signs are stable she is alert and oriented 3. She has pain on palpation of the Achilles tendon at its insertion site left. It is barely warm to the touch. She still has pain on palpation palpable Mulder's click third or digital space bilateral.  Assessment: Pain in limb secondary plantar fasciitis Achilles tendinitis and neuroma bilateral.  Plan: Injected the third interdigital space today bilaterally. I injected the left heel today with dexamethasone and local anesthetic. We reinitiated her dehydrated alcohol injections.

## 2016-12-17 ENCOUNTER — Encounter: Payer: Self-pay | Admitting: Podiatry

## 2016-12-17 ENCOUNTER — Ambulatory Visit (INDEPENDENT_AMBULATORY_CARE_PROVIDER_SITE_OTHER): Payer: Medicare Other | Admitting: Podiatry

## 2016-12-17 DIAGNOSIS — M7662 Achilles tendinitis, left leg: Secondary | ICD-10-CM | POA: Diagnosis not present

## 2016-12-17 DIAGNOSIS — G588 Other specified mononeuropathies: Secondary | ICD-10-CM

## 2016-12-17 DIAGNOSIS — G5760 Lesion of plantar nerve, unspecified lower limb: Secondary | ICD-10-CM

## 2016-12-17 NOTE — Progress Notes (Signed)
She presents today for follow-up of Achilles tendinitis to the left heel and neuroma to the third interdigital space of the bilateral foot states that this still not on the back of the left foot but the toes seem to be getting better.  Objective: Vital signs are stable alert and oriented 3 insertional Achilles tendinitis is still present with pain on palpation of the Achilles tendon left heel. It is not as swollen nor is it as warm as it was previously. It is not as tender as it has been in the past. She has palpable Mulder's click to the third interdigital space bilateral.  Assessment: Pain in limb secondary to Achilles tendinitis left heel neuroma third interdigital space bilateral.  Plan: Reinjected the left heel today with 2 mg of dexamethasone and I also injected with dehydrated alcohol to the third interdigital space bilateral. Follow up with her in 3-4 weeks.

## 2017-01-09 ENCOUNTER — Ambulatory Visit
Admission: RE | Admit: 2017-01-09 | Discharge: 2017-01-09 | Disposition: A | Discharge status: Home / Self Care | Payer: Medicare Other | Source: Ambulatory Visit | Attending: Internal Medicine | Admitting: Internal Medicine

## 2017-01-09 DIAGNOSIS — C8588 Other specified types of non-Hodgkin lymphoma, lymph nodes of multiple sites: Secondary | ICD-10-CM | POA: Diagnosis present

## 2017-01-09 DIAGNOSIS — I2510 Atherosclerotic heart disease of native coronary artery without angina pectoris: Secondary | ICD-10-CM | POA: Diagnosis not present

## 2017-01-09 DIAGNOSIS — K5730 Diverticulosis of large intestine without perforation or abscess without bleeding: Secondary | ICD-10-CM | POA: Diagnosis not present

## 2017-01-09 DIAGNOSIS — I700 Atherosclerosis of aorta: Secondary | ICD-10-CM | POA: Diagnosis not present

## 2017-01-09 DIAGNOSIS — N839 Noninflammatory disorder of ovary, fallopian tube and broad ligament, unspecified: Secondary | ICD-10-CM | POA: Insufficient documentation

## 2017-01-09 LAB — GLUCOSE, CAPILLARY: GLUCOSE-CAPILLARY: 97 mg/dL (ref 65–99)

## 2017-01-09 MED ORDER — FLUDEOXYGLUCOSE F - 18 (FDG) INJECTION
12.1400 | Freq: Once | INTRAVENOUS | Status: AC | PRN
  Administered 2017-01-09: 12.14 via INTRAVENOUS

## 2017-01-12 ENCOUNTER — Telehealth: Payer: Self-pay | Admitting: *Deleted

## 2017-01-12 ENCOUNTER — Encounter: Payer: Self-pay | Admitting: Podiatry

## 2017-01-12 ENCOUNTER — Ambulatory Visit (INDEPENDENT_AMBULATORY_CARE_PROVIDER_SITE_OTHER): Payer: Medicare Other | Admitting: Podiatry

## 2017-01-12 DIAGNOSIS — G588 Other specified mononeuropathies: Secondary | ICD-10-CM

## 2017-01-12 DIAGNOSIS — M7662 Achilles tendinitis, left leg: Secondary | ICD-10-CM | POA: Diagnosis not present

## 2017-01-12 DIAGNOSIS — G5760 Lesion of plantar nerve, unspecified lower limb: Secondary | ICD-10-CM | POA: Diagnosis not present

## 2017-01-12 NOTE — Telephone Encounter (Signed)
Dr. Milinda Pointer ordered MRI left foot without contrast. Faxed to [facility].

## 2017-01-12 NOTE — Progress Notes (Signed)
She presents today for follow-up of her neuroma third interdigital space bilaterally. She states that she is getting some better.  Objective: Vital signs are stable alert and oriented 3. Pulses are palpable. She has pain on palpation third interdigital space bilateral.  Assessment: Fasciitis bilateral.  Plan: Reinject the left foot today. Follow up with her in 3 weeks

## 2017-01-15 ENCOUNTER — Inpatient Hospital Stay: Payer: Medicare Other

## 2017-01-15 ENCOUNTER — Inpatient Hospital Stay: Payer: Medicare Other | Attending: Internal Medicine | Admitting: Internal Medicine

## 2017-01-15 VITALS — BP 159/87 | HR 80 | Temp 97.0°F | Wt 214.4 lb

## 2017-01-15 DIAGNOSIS — C8588 Other specified types of non-Hodgkin lymphoma, lymph nodes of multiple sites: Secondary | ICD-10-CM

## 2017-01-15 DIAGNOSIS — R1903 Right lower quadrant abdominal swelling, mass and lump: Secondary | ICD-10-CM | POA: Insufficient documentation

## 2017-01-15 DIAGNOSIS — Z9221 Personal history of antineoplastic chemotherapy: Secondary | ICD-10-CM | POA: Diagnosis not present

## 2017-01-15 DIAGNOSIS — R109 Unspecified abdominal pain: Secondary | ICD-10-CM | POA: Insufficient documentation

## 2017-01-15 LAB — CBC WITH DIFFERENTIAL/PLATELET
Basophils Absolute: 0 10*3/uL (ref 0–0.1)
Basophils Relative: 1 %
EOS PCT: 3 %
Eosinophils Absolute: 0.1 10*3/uL (ref 0–0.7)
HEMATOCRIT: 41.9 % (ref 35.0–47.0)
HEMOGLOBIN: 14.5 g/dL (ref 12.0–16.0)
LYMPHS ABS: 3.1 10*3/uL (ref 1.0–3.6)
LYMPHS PCT: 57 %
MCH: 31.6 pg (ref 26.0–34.0)
MCHC: 34.7 g/dL (ref 32.0–36.0)
MCV: 91.2 fL (ref 80.0–100.0)
Monocytes Absolute: 0.6 10*3/uL (ref 0.2–0.9)
Monocytes Relative: 11 %
NEUTROS PCT: 28 %
Neutro Abs: 1.5 10*3/uL (ref 1.4–6.5)
Platelets: 269 10*3/uL (ref 150–440)
RBC: 4.59 MIL/uL (ref 3.80–5.20)
RDW: 14 % (ref 11.5–14.5)
WBC: 5.3 10*3/uL (ref 3.6–11.0)

## 2017-01-15 LAB — COMPREHENSIVE METABOLIC PANEL
ALK PHOS: 91 U/L (ref 38–126)
ALT: 19 U/L (ref 14–54)
AST: 23 U/L (ref 15–41)
Albumin: 4.2 g/dL (ref 3.5–5.0)
Anion gap: 7 (ref 5–15)
BILIRUBIN TOTAL: 0.7 mg/dL (ref 0.3–1.2)
BUN: 11 mg/dL (ref 6–20)
CALCIUM: 9.3 mg/dL (ref 8.9–10.3)
CO2: 24 mmol/L (ref 22–32)
CREATININE: 0.58 mg/dL (ref 0.44–1.00)
Chloride: 107 mmol/L (ref 101–111)
Glucose, Bld: 121 mg/dL — ABNORMAL HIGH (ref 65–99)
Potassium: 3.8 mmol/L (ref 3.5–5.1)
Sodium: 138 mmol/L (ref 135–145)
Total Protein: 6.7 g/dL (ref 6.5–8.1)

## 2017-01-15 LAB — LACTATE DEHYDROGENASE: LDH: 152 U/L (ref 98–192)

## 2017-01-15 NOTE — Assessment & Plan Note (Addendum)
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 FEB 2018- PET scan July 2018-slightly worse [increase in few mm]; however I do not think this is clinically significant to recommend any treatment for her lymphoma. Patient does understand that although this is incurable lymphoma; this is easily treatable with multiple treatment options available. For now recommend surveillance from the lymphoma standpoint.  # Lower abdominal pain/discomfort- unclear etiology- clinically I do not think this is related to her history of lymphoma.  # right ovarian mass- Gyn/gyn Onc.pt wants to hold off US/ sec to discomfort- we will inform GU Nurse Navigator. Awaiting evaluation with gynecology oncology next week.   # follow up in 3 months; no scan.   # I reviewed the blood work- with the patient in detail; also reviewed the imaging independently [as summarized above]; and with the patient in detail.

## 2017-01-15 NOTE — Progress Notes (Signed)
[place] OFFICE PROGRESS NOTE  Patient Care Team: Tracie Harrier, MD as PCP - General (Internal Medicine) Benjaman Kindler, MD as Consulting Physician (Obstetrics and Gynecology) Clent Jacks, RN as Registered Nurse  Cancer Staging No matching staging information was found for the patient.   Oncology History   # MAY 2015- Diagnosis of   low-grade B-cell lymphoma marginal zone.  From the left axillary lymph node biopsy.  Patient had a weekly rituximab therapy in May 2015 # April 2017-   Progressive disease with enlargement of lymphadenopathy on a subsequent CT scan;APril 2017- Rituxan weekly x4  #  Right adnexal mass and followed by Dr. Retia Passe for 2 years]; monitored.       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  female pleasant patient above history of marginal zone lymphoma recurrent status post most recently treated in March April 2017; and also history of right ovarian mass is here for follow-up/ To review the results of her restaging PET scan.  Patient continues to complain of chronic lower abdominal pain; back pain. This is not improving; she is also chronic knee pain. Patient not too keen on surgery- for her back and knee this time. No fevers. Appetite is good. No weight loss. Denies any new lumps or bumps. No chest pain or shortness of breath or cough.  REVIEW OF SYSTEMS:  A complete 10 point review of system is done which is negative except mentioned above/history of present illness.   PAST MEDICAL HISTORY :  Past Medical History:  Diagnosis Date  . Anxiety   . GERD (gastroesophageal reflux disease)   . History of appendectomy    30 plus yrs ago  . Hypertension   . Hypothyroidism   . Kidney stones   . Non Hodgkin's lymphoma ([place]) 2015  . S/P partial  hysterectomy   . Thyroid disease    hypothyroidism    PAST SURGICAL HISTORY :   Past Surgical History:  Procedure Laterality Date  . ABDOMINAL HYSTERECTOMY    . APPENDECTOMY    . AXILLARY LYMPH NODE BIOPSY Left 2015  . BREAST BIOPSY Right 07/15/1995   stereotactic biopsy at [facility]  . CARPAL TUNNEL RELEASE    . CERVICAL SPINE SURGERY    . COLONOSCOPY WITH PROPOFOL N/A 12/20/2015   Procedure: COLONOSCOPY WITH PROPOFOL;  Surgeon: Hulen Luster, MD;  Location: [facility] ENDOSCOPY;  Service: Gastroenterology;  Laterality: N/A;  . ESOPHAGOGASTRODUODENOSCOPY (EGD) WITH PROPOFOL N/A 12/20/2015   Procedure: ESOPHAGOGASTRODUODENOSCOPY (EGD) WITH PROPOFOL;  Surgeon: Hulen Luster, MD;  Location: [facility] ENDOSCOPY;  Service: Gastroenterology;  Laterality: N/A;  . JOINT REPLACEMENT Left 01/2015   knee  . TONSILLECTOMY      FAMILY HISTORY :   Family History  Problem Relation Age of Onset  . Cancer Other     breast cancer, colon cancer, lymphoma, ovarian cancer  . Diabetes Other   . Anemia Other   . CAD Other   . Liver disease Other   . Breast cancer Daughter   . Lymphoma Mother   . Pancreatic cancer Father   . Liver cancer Father     SOCIAL HISTORY:   Social History  Substance Use Topics  . Smoking status: Former Smoker    Packs/day: 1.00    Years: 20.00  Quit date: 01/27/1994  . Smokeless tobacco: Never Used  . Alcohol use No    ALLERGIES:  is allergic to metoclopramide; percocet [oxycodone-acetaminophen]; and sulfa antibiotics.  MEDICATIONS:  Current Outpatient Prescriptions  Medication Sig Dispense Refill  . ALPRAZolam (XANAX) 0.25 MG tablet Take 0.25 mg by mouth at bedtime as needed for anxiety.    Marland Kitchen amitriptyline (ELAVIL) 25 MG tablet Take 25 mg by mouth as needed (for head aches).     Marland Kitchen aspirin 81 MG tablet Take 81 mg by mouth every morning. Reported on 02/06/2016    . fluticasone (FLONASE) 50 MCG/ACT nasal spray Place into the nose as needed.     Marland Kitchen levothyroxine (SYNTHROID, LEVOTHROID)  112 MCG tablet Take 112 mcg by mouth daily before breakfast.    . meclizine (ANTIVERT) 25 MG tablet Take 25 mg by mouth as needed.    Marland Kitchen omeprazole (PRILOSEC) 20 MG capsule Take 20 mg by mouth every morning.      No current facility-administered medications for this visit.     PHYSICAL EXAMINATION: ECOG PERFORMANCE STATUS: 0 - Asymptomatic  BP (!) 159/87 (BP Location: Left Arm, Patient Position: Sitting)   Pulse 80   Temp 97 F (36.1 C) (Tympanic)   Wt 214 lb 6 oz (97.2 kg)   BMI 35.67 kg/m   Filed Weights   01/15/17 1019  Weight: 214 lb 6 oz (97.2 kg)    GENERAL: Well-nourished well-developed; Alert, no distress and comfortable.   Obese. She is alone. EYES: no pallor or icterus OROPHARYNX: no thrush or ulceration; good dentition  NECK: supple, no masses felt LYMPH:  no palpable lymphadenopathy in the cervical, axillary. Approximately 1 cm sized lymph nodes felt in the left inguinal region. LUNGS: clear to auscultation and  No wheeze or crackles HEART/CVS: regular rate & rhythm and no murmurs; No lower extremity edema ABDOMEN:abdomen soft, non-tender and normal bowel sounds Musculoskeletal:no cyanosis of digits and no clubbing  PSYCH: alert & oriented x 3 with fluent speech NEURO: no focal motor/sensory deficits SKIN:  no rashes or significant lesions  LABORATORY DATA:  I have reviewed the data as listed    Component Value Date/Time   NA 138 01/15/2017 0925   NA 139 01/08/2015 1058   K 3.8 01/15/2017 0925   K 4.3 01/08/2015 1058   CL 107 01/15/2017 0925   CL 107 01/08/2015 1058   CO2 24 01/15/2017 0925   CO2 26 01/08/2015 1058   GLUCOSE 121 (H) 01/15/2017 0925   GLUCOSE 96 01/08/2015 1058   BUN 11 01/15/2017 0925   BUN 11 01/08/2015 1058   CREATININE 0.58 01/15/2017 0925   CREATININE 0.63 01/08/2015 1058   CALCIUM 9.3 01/15/2017 0925   CALCIUM 9.3 01/08/2015 1058   PROT 6.7 01/15/2017 0925   PROT 7.0 07/11/2014 1010   ALBUMIN 4.2 01/15/2017 0925   ALBUMIN 3.6  07/11/2014 1010   AST 23 01/15/2017 0925   AST 23 07/11/2014 1010   ALT 19 01/15/2017 0925   ALT 25 07/11/2014 1010   ALKPHOS 91 01/15/2017 0925   ALKPHOS 84 07/11/2014 1010   BILITOT 0.7 01/15/2017 0925   BILITOT 0.4 07/11/2014 1010   GFRNONAA >60 01/15/2017 0925   GFRNONAA >60 01/08/2015 1058   GFRNONAA >60 07/11/2014 1010   GFRAA >60 01/15/2017 0925   GFRAA >60 01/08/2015 1058   GFRAA >60 07/11/2014 1010    No results found for: SPEP, UPEP  Lab Results  Component Value Date   WBC 5.3 01/15/2017  NEUTROABS 1.5 01/15/2017   HGB 14.5 01/15/2017   HCT 41.9 01/15/2017   MCV 91.2 01/15/2017   PLT 269 01/15/2017      Chemistry      Component Value Date/Time   NA 138 01/15/2017 0925   NA 139 01/08/2015 1058   K 3.8 01/15/2017 0925   K 4.3 01/08/2015 1058   CL 107 01/15/2017 0925   CL 107 01/08/2015 1058   CO2 24 01/15/2017 0925   CO2 26 01/08/2015 1058   BUN 11 01/15/2017 0925   BUN 11 01/08/2015 1058   CREATININE 0.58 01/15/2017 0925   CREATININE 0.63 01/08/2015 1058      Component Value Date/Time   CALCIUM 9.3 01/15/2017 0925   CALCIUM 9.3 01/08/2015 1058   ALKPHOS 91 01/15/2017 0925   ALKPHOS 84 07/11/2014 1010   AST 23 01/15/2017 0925   AST 23 07/11/2014 1010   ALT 19 01/15/2017 0925   ALT 25 07/11/2014 1010   BILITOT 0.7 01/15/2017 0925   BILITOT 0.4 07/11/2014 1010     IMPRESSION: 1. Compare to 12/14/2015 CT, decreased size of nodes within the chest and pelvis, consistent with response to therapy. Compared to most recent PET of 08/29/2014, overall similar low-level hypermetabolism within these nodes. 2. Similar size of a right pelvic lesion which is again favored to be ovarian in origin. This demonstrates similar low-level hypermetabolism compared to 2015. Favor a benign ovarian neoplasm. 3.  Coronary artery atherosclerosis. Aortic atherosclerosis. 4. Hepatic steatosis. 5. Right nephrolithiasis. Electronically Signed   By: Abigail Miyamoto M.D.    On: 07/03/2016 14:17 -------------------------------------------------------------------------------------------------------------------------  IMPRESSION: 1. Mild increase in hypermetabolic activity associated with the left pelvic sidewall lymph node and bilateral axillary lymph nodes. The left pelvic sidewall node is Deauville 4 and the axillary nodes are Deauville 3. 2. Sigmoid colon diverticulosis. 3. Pars defects at L5. 4. Coronary, aortic arch, and branch vessel atherosclerotic vascular disease. 5. Right ovarian lesion has stable size and low-grade activity over the last several years, likely a benign neoplastic process.   Electronically Signed   By: Van Clines M.D.   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 FEB 2018- PET scan July 2018-slightly worse [increase in few mm]; however I do not think this is clinically significant to recommend any treatment for her lymphoma. Patient does understand that although this is incurable lymphoma; this is easily treatable with multiple treatment options available. For now recommend surveillance from the lymphoma standpoint.  # Lower abdominal pain/discomfort- unclear etiology- clinically I do not think this is related to her history of lymphoma.  # right ovarian mass- Gyn/gyn Onc.pt wants to hold off US/ sec to discomfort- we will inform GU Nurse Navigator. Awaiting evaluation with gynecology oncology next week.   # follow up in 3 months; no scan.   # I reviewed the blood work- with the patient in detail; also reviewed the imaging independently [as summarized above]; and with the patient in detail.   Orders Placed This Encounter  Procedures  . CBC with Differential    Standing Status:   Future    Standing Expiration Date:   01/15/2018  . Comprehensive metabolic panel     Standing Status:   Future    Standing Expiration Date:   01/15/2018  . Lactate dehydrogenase    Standing Status:   Future  Standing Expiration Date:   01/15/2018   All questions were answered. The patient knows to call the clinic with any problems, questions or concerns. Reviewed the recent labs from her PCP's office. Normal limits.     Cammie Sickle, MD 01/16/2017 7:11 PM

## 2017-01-15 NOTE — Progress Notes (Signed)
Patient here for follow up

## 2017-01-19 ENCOUNTER — Ambulatory Visit
Admission: RE | Admit: 2017-01-19 | Discharge: 2017-01-19 | Disposition: A | Discharge status: Home / Self Care | Payer: Medicare Other | Source: Ambulatory Visit | Attending: Podiatry | Admitting: Podiatry

## 2017-01-19 DIAGNOSIS — M19072 Primary osteoarthritis, left ankle and foot: Secondary | ICD-10-CM | POA: Insufficient documentation

## 2017-01-19 DIAGNOSIS — M67874 Other specified disorders of tendon, left ankle and foot: Secondary | ICD-10-CM | POA: Insufficient documentation

## 2017-01-19 DIAGNOSIS — M899 Disorder of bone, unspecified: Secondary | ICD-10-CM | POA: Diagnosis not present

## 2017-01-19 DIAGNOSIS — M7662 Achilles tendinitis, left leg: Secondary | ICD-10-CM

## 2017-01-20 ENCOUNTER — Telehealth: Payer: Self-pay | Admitting: Internal Medicine

## 2017-01-20 DIAGNOSIS — N838 Other noninflammatory disorders of ovary, fallopian tube and broad ligament: Secondary | ICD-10-CM

## 2017-01-20 DIAGNOSIS — F410 Panic disorder [episodic paroxysmal anxiety] without agoraphobia: Secondary | ICD-10-CM

## 2017-01-20 MED ORDER — DIAZEPAM 2 MG PO TABS: 2.0000 mg | ORAL_TABLET | Freq: Once | ORAL | 0 refills | Status: AC

## 2017-01-20 NOTE — Telephone Encounter (Signed)
Script printed and will be faxed to pt's pharmacy.

## 2017-01-20 NOTE — Telephone Encounter (Signed)
Kaitlin Thompson has an appointment with GynOnc 2/21. Does she need to keep this appointment or wait until she has further imaging?

## 2017-01-20 NOTE — Telephone Encounter (Addendum)
-----   Message from Garrel Ridgel, [place] sent at 01/19/2017  5:10 PM EST ----- Send for over read and inform patient of the delay. 01/20/2017-I informed pt of Dr. Stephenie Acres orders and explained the delay. Faxed request for MRI disc copy. 01/23/2017-Mailed copy of MRI disc to [facility]. 02/04/2017-Pt called for results of MRI. [facility] faxed the results. I informed pt the results were in and Dr. Milinda Pointer would review and I would call with instructions. Pt states the foot was bothering her. I informed pt of Dr. Milinda Pointer orders for her to make an appt to discuss surgery. Pt states make a note for the schedulers to call after 2:00pm to scheduler.

## 2017-01-20 NOTE — Addendum Note (Signed)
Addended by: Sabino Gasser on: 01/20/2017 04:05 PM   Modules accepted: Orders

## 2017-01-20 NOTE — Telephone Encounter (Signed)
Reviewed chart - U/s pelvis. Patient states that she personally wanted the u/s cnl. She would rather wait until the first of march for the test due to financial reason and other md appts.  Pt prefers to open MRI machine as pt states she will need a valium sent to pharmacy due to claustrophobia. Pt states that she usually takes 1 valium - 1 hr prior the procedure and a second dose right before the scan.

## 2017-01-20 NOTE — Telephone Encounter (Signed)
As per gyn-onc; plan MRI ab/pelvis; cancel pelvic US.  H/J- please inform pt; schedule MR.

## 2017-01-21 NOTE — Telephone Encounter (Signed)
Patient has already cnl her gyn onc apt. Will re-evaluate the need for gyn onc apt s/p mri results

## 2017-01-23 ENCOUNTER — Ambulatory Visit: Payer: Medicare Other

## 2017-01-28 ENCOUNTER — Ambulatory Visit: Payer: Medicare Other

## 2017-02-04 ENCOUNTER — Encounter: Payer: Self-pay | Admitting: Podiatry

## 2017-02-04 NOTE — Telephone Encounter (Signed)
Have her in for surgical consult

## 2017-02-11 ENCOUNTER — Ambulatory Visit (INDEPENDENT_AMBULATORY_CARE_PROVIDER_SITE_OTHER): Payer: Medicare Other | Admitting: Podiatry

## 2017-02-11 DIAGNOSIS — G5760 Lesion of plantar nerve, unspecified lower limb: Secondary | ICD-10-CM

## 2017-02-11 DIAGNOSIS — M7662 Achilles tendinitis, left leg: Secondary | ICD-10-CM | POA: Diagnosis not present

## 2017-02-11 DIAGNOSIS — G588 Other specified mononeuropathies: Secondary | ICD-10-CM

## 2017-02-11 NOTE — Progress Notes (Signed)
She presents today for follow-up of her MRI to her posterior aspect of her left foot. She states that I been using the night splint and taking Tylenol really F had no relief.  Objective: Vital signs are stable alert and oriented 3. Pulses are palpable. Neurologic sensory was intact still has severe pain palpation superior aspect of the calcaneus and along the posterior margin were the Achilles inserts. MRI does relate plantar fasciitis and insertional Achilles tendinitis as well as mid to distal Achilles tendinitis.  Assessment: Insertional Achilles tendinitis and distal Achilles tendinitis with plantar fasciitis.  Plan: I recommended that she start physical therapy.

## 2017-03-02 ENCOUNTER — Other Ambulatory Visit: Payer: Self-pay | Admitting: Internal Medicine

## 2017-03-02 DIAGNOSIS — Z1231 Encounter for screening mammogram for malignant neoplasm of breast: Secondary | ICD-10-CM

## 2017-03-22 IMAGING — CT CT ABD-PELV W/ CM
2 of 5 series · 14 of 46 positions shown, 16 images · IV contrast (omnipaque)
Comparison: 08/08/2015 abdominal pelvic CT. Chest abdomen and
pelvic CTs of 06/06/2015.

CLINICAL DATA: Restaging of marginal zone lymphoma. Treated with
chemotherapy in Saturday March, 2014.

EXAM:
CT CHEST, ABDOMEN, AND PELVIS WITH CONTRAST
TECHNIQUE: Multidetector CT imaging of the chest, abdomen and pelvis was
performed following the standard protocol during bolus
administration of intravenous contrast.
CONTRAST:  100mL OMNIPAQUE IOHEXOL 300 MG/ML  SOLN

[Series 2: cap with · axial · 0.75mm/px · z∈[-881,-331]mm · 11 of 124 slices shown, 13 images]
[im 7/124  soft-tissue]
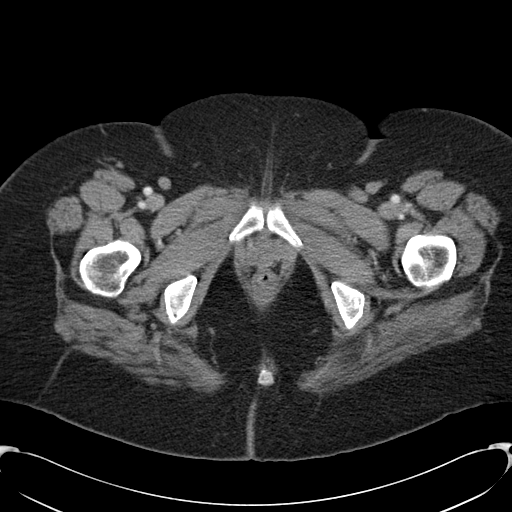
[im 7/124  bone]
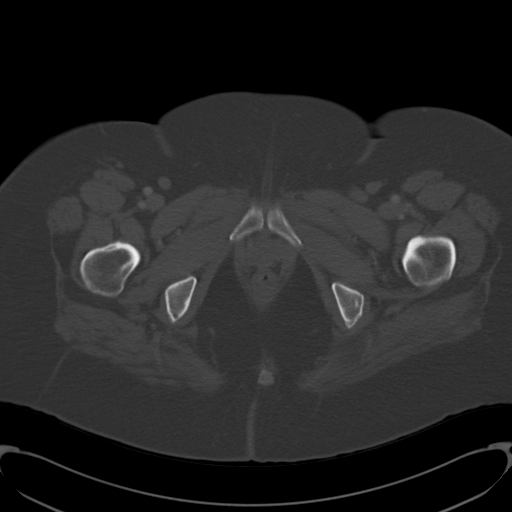
[im 21/124  soft-tissue]
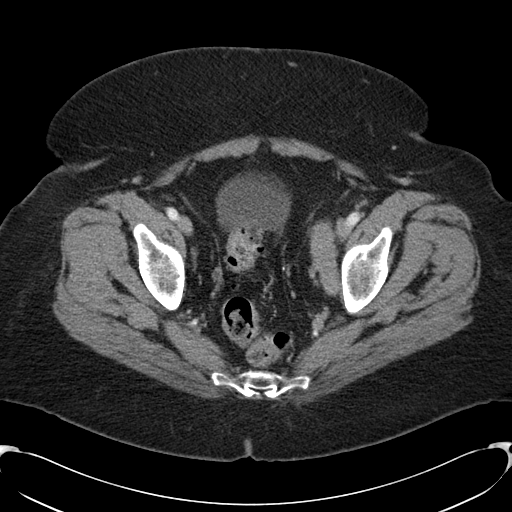
[im 28/124  soft-tissue]
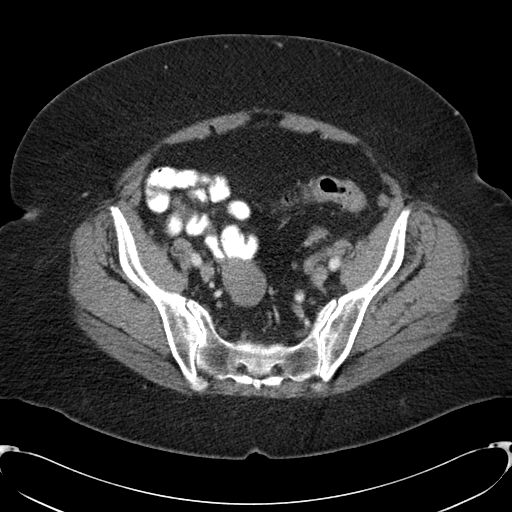
[im 42/124  soft-tissue]
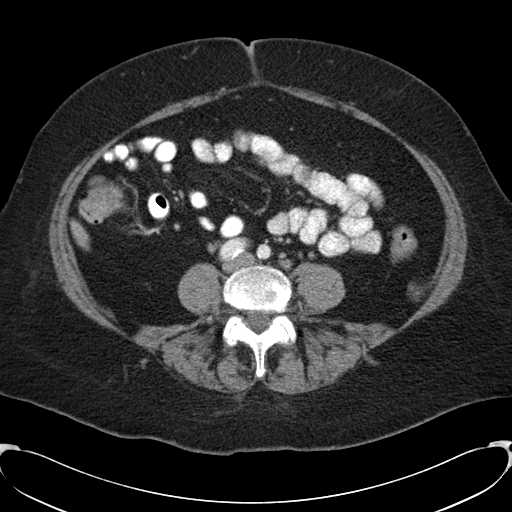
[im 48/124  soft-tissue]
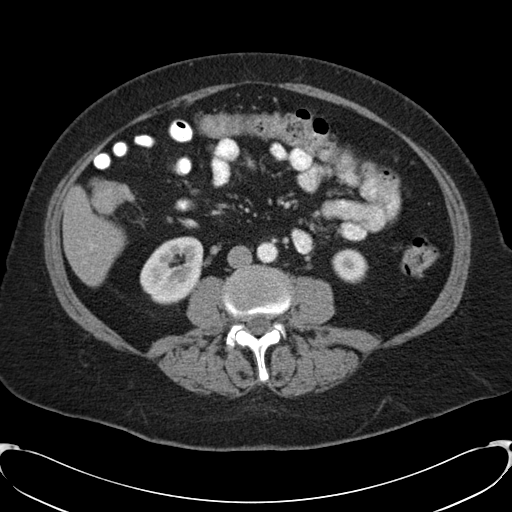
[im 62/124  soft-tissue]
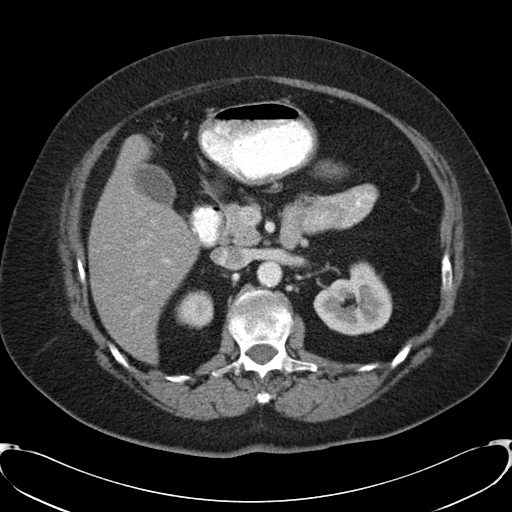
[im 76/124  soft-tissue]
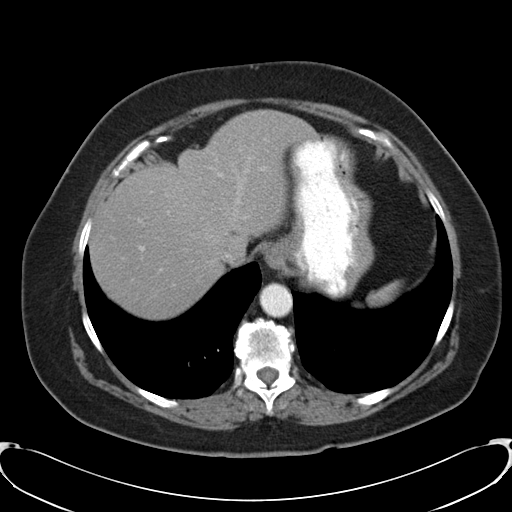
[im 83/124  soft-tissue]
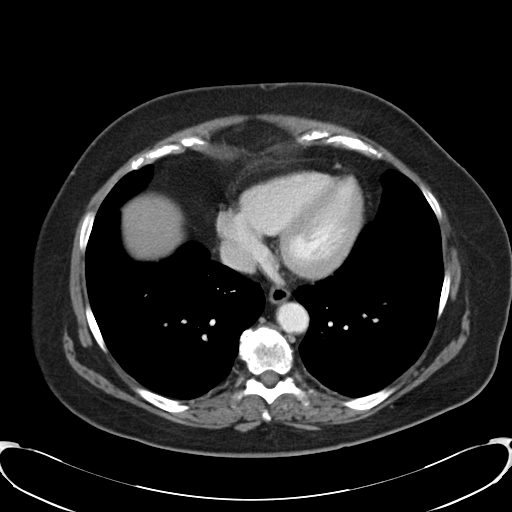
[im 96/124  soft-tissue]
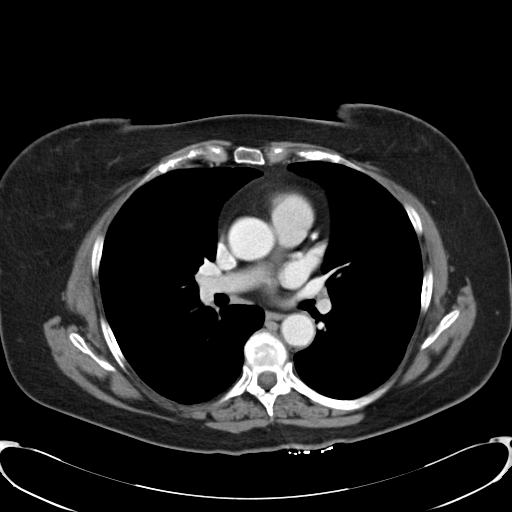
[im 96/124  bone]
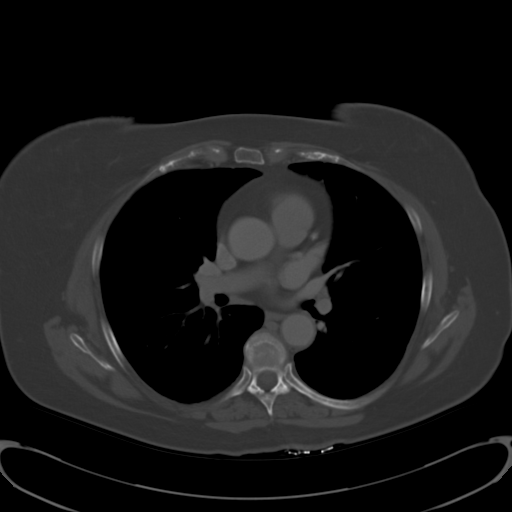
[im 103/124  soft-tissue]
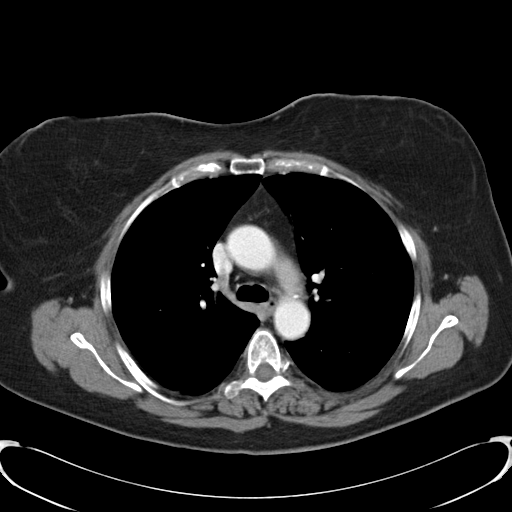
[im 117/124  soft-tissue]
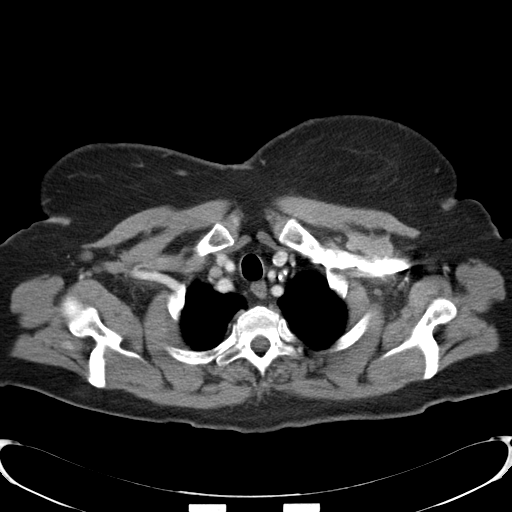

[Series 5: cor cap with cor cor · coronal · 0.75mm/px · 3 of 149 slices shown]
[im 50/149  soft-tissue]
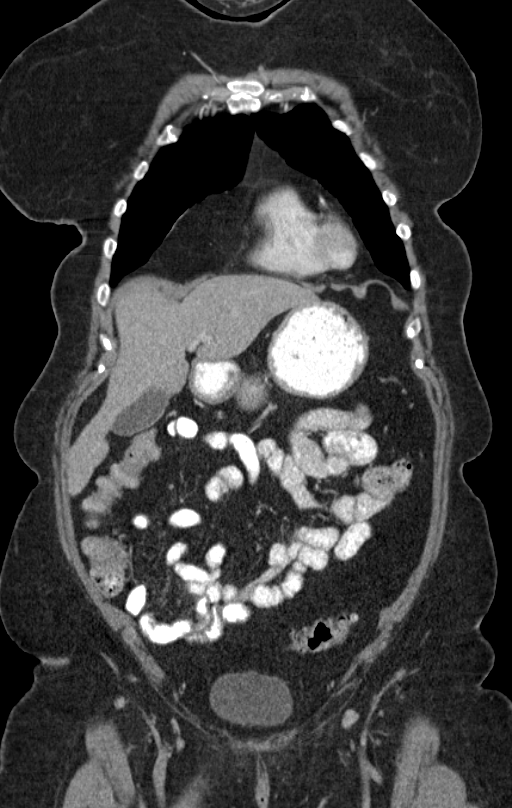
[im 66/149  soft-tissue]
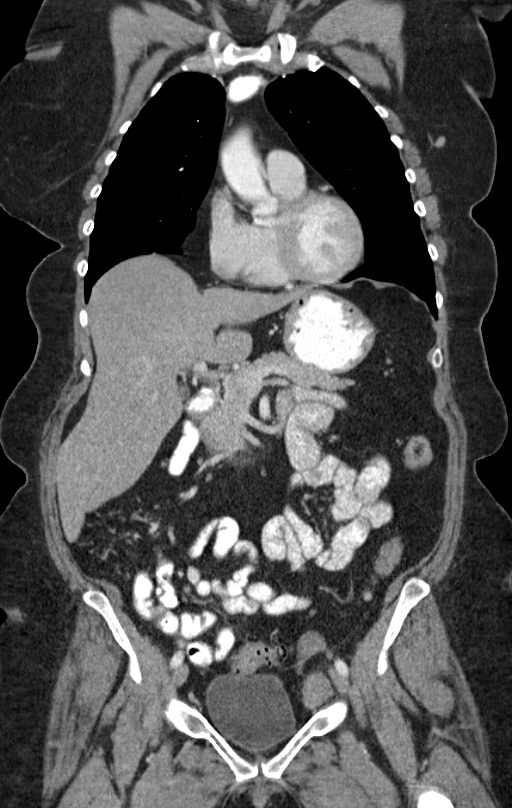
[im 83/149  soft-tissue]
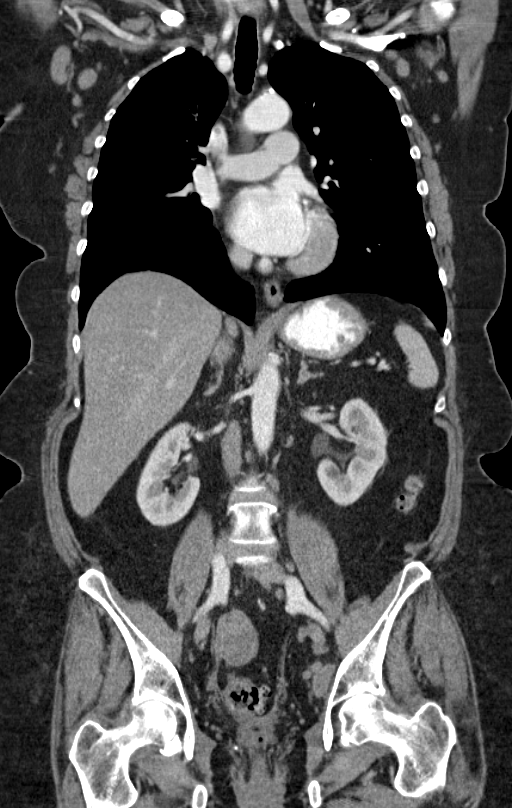

[14 of 46 positions shown; findings below may reference images not displayed]

FINDINGS: CT CHEST

Mediastinum/Nodes: Tortuous descending thoracic aorta. Aortic and
branch vessel atherosclerosis. Normal heart size, without
pericardial effusion. Lad calcified atherosclerosis. No central
pulmonary embolism, on this non-dedicated study. No middle
mediastinal or hilar adenopathy. A periesophageal node is mildly
enlarged at 9 mm today versus 8 mm on the prior. Image 39/series 2
today.

No internal mammary adenopathy.

Lungs/Pleura: No pleural fluid.

Musculoskeletal: Bilateral axillary adenopathy. A right axillary
node measures 1.3 cm on image 11/series 2 versus 1.0 cm on the prior
exam. An adjacent more inferior 13 mm node on image 14/series 2
measured 11 mm on the prior.

Index left axillary node measures 1.1 x 2.1 cm on image 10/series 2.
1.3 x 2.1 cm on the prior exam.

No acute osseous abnormality.

CT ABDOMEN AND PELVIS

Hepatobiliary: Hepatomegaly, nearly 21 cm craniocaudal. Normal
gallbladder, without biliary ductal dilatation.

Pancreas: Normal, without mass or ductal dilatation.

Spleen: Normal in size, without focal abnormality.

Adrenals/Urinary Tract: Bilateral adrenal nodularity. Right-sided on
the order of 10 mm and similar. Left-sided on the order of 13 mm and
similar.

Punctate right renal collecting system calculus. Bilateral too small
to characterize renal lesions. A lower pole right renal lesion
measures approximately 9 mm and is likely a cyst. No hydronephrosis.
Normal urinary bladder.

Stomach/Bowel: Normal stomach, without wall thickening. Scattered
colonic diverticula. Normal terminal ileum. Normal small bowel.

Vascular/Lymphatic: Aortic and branch vessel atherosclerosis. Small
retroperitoneal nodes are similar and not pathologic by size
criteria.

Left external iliac nodal mass measures 2.3 x 3.9 cm on image
106/series 2. Compare 2.3 x 3.8 cm on the prior exam (when
remeasured).

Left inguinal node measures 1.2 cm on image 117/series 2. 1.1 cm on
the prior.

Left common iliac node measures 8 mm today and is unchanged.

Reproductive: Normal left ovary/adnexa. A soft tissue density within
the right hemipelvis is favored to arise from the right ovary. 3.9 x
3.4 cm on image 99/series 2. Similar to on the 06/06/2015 exam (when
remeasured).

Other: No significant free fluid. No evidence of omental or
peritoneal disease.

Musculoskeletal: Bilateral L5 pars defects. Grade 1 L5-S1
anterolisthesis.
IMPRESSION: CT CHEST IMPRESSION

1. Slight progression of thoracic adenopathy.
2. Coronary artery atherosclerosis.

CT ABDOMEN AND PELVIS IMPRESSION

1. Similar pelvic adenopathy.
2. Hepatomegaly.
3. Right nephrolithiasis.
4. Similar right pelvic mass, favored to be ovarian in origin.
5. Similar bilateral adrenal nodularity.

## 2017-04-15 ENCOUNTER — Ambulatory Visit (INDEPENDENT_AMBULATORY_CARE_PROVIDER_SITE_OTHER): Payer: Medicare Other | Admitting: Podiatry

## 2017-04-15 ENCOUNTER — Inpatient Hospital Stay: Payer: Medicare Other

## 2017-04-15 ENCOUNTER — Inpatient Hospital Stay: Payer: Medicare Other | Admitting: Internal Medicine

## 2017-04-15 DIAGNOSIS — M7662 Achilles tendinitis, left leg: Secondary | ICD-10-CM

## 2017-04-15 DIAGNOSIS — M7752 Other enthesopathy of left foot: Secondary | ICD-10-CM | POA: Diagnosis not present

## 2017-04-15 NOTE — Progress Notes (Signed)
She presents today for follow-up of her insertional Achilles tendinitis of her left foot. She states that the therapy was helping a lot but I noticed that it has come to a standstill. She reports that the knot on the back of the foot has become more painful once again.  Objective: Vital signs are stable she is alert and oriented 3. Pulses are palpable. Neurologic sensorium is intact. Deep tendon reflexes are intact. Muscle strength is normal. She is pain on palpation with overlying bursitis of the posterior aspect of the left calcaneus. Achilles tendinitis appears to be resolving very nicely.  Assessment: I injected the bursa today with dexamethasone and local anesthetic only 2 mg was utilized. I also recommended that she continue physical therapy.

## 2017-04-30 ENCOUNTER — Ambulatory Visit
Admission: RE | Admit: 2017-04-30 | Discharge: 2017-04-30 | Disposition: A | Discharge status: Home / Self Care | Payer: Medicare Other | Source: Ambulatory Visit | Attending: Internal Medicine | Admitting: Internal Medicine

## 2017-04-30 DIAGNOSIS — Z1231 Encounter for screening mammogram for malignant neoplasm of breast: Secondary | ICD-10-CM | POA: Insufficient documentation

## 2017-06-04 ENCOUNTER — Inpatient Hospital Stay: Payer: Medicare Other | Attending: Internal Medicine

## 2017-06-04 ENCOUNTER — Encounter (INDEPENDENT_AMBULATORY_CARE_PROVIDER_SITE_OTHER): Payer: Self-pay

## 2017-06-04 ENCOUNTER — Inpatient Hospital Stay (HOSPITAL_BASED_OUTPATIENT_CLINIC_OR_DEPARTMENT_OTHER): Payer: Medicare Other | Admitting: Internal Medicine

## 2017-06-04 VITALS — BP 125/82 | HR 88 | Temp 98.2°F | Resp 20 | Ht 65.0 in | Wt 212.0 lb

## 2017-06-04 DIAGNOSIS — M25569 Pain in unspecified knee: Secondary | ICD-10-CM | POA: Insufficient documentation

## 2017-06-04 DIAGNOSIS — I10 Essential (primary) hypertension: Secondary | ICD-10-CM | POA: Diagnosis not present

## 2017-06-04 DIAGNOSIS — F419 Anxiety disorder, unspecified: Secondary | ICD-10-CM

## 2017-06-04 DIAGNOSIS — G8929 Other chronic pain: Secondary | ICD-10-CM

## 2017-06-04 DIAGNOSIS — Z8041 Family history of malignant neoplasm of ovary: Secondary | ICD-10-CM

## 2017-06-04 DIAGNOSIS — K5730 Diverticulosis of large intestine without perforation or abscess without bleeding: Secondary | ICD-10-CM | POA: Insufficient documentation

## 2017-06-04 DIAGNOSIS — B029 Zoster without complications: Secondary | ICD-10-CM | POA: Insufficient documentation

## 2017-06-04 DIAGNOSIS — Z7982 Long term (current) use of aspirin: Secondary | ICD-10-CM | POA: Insufficient documentation

## 2017-06-04 DIAGNOSIS — K219 Gastro-esophageal reflux disease without esophagitis: Secondary | ICD-10-CM | POA: Diagnosis not present

## 2017-06-04 DIAGNOSIS — Z800 Family history of malignant neoplasm of digestive organs: Secondary | ICD-10-CM

## 2017-06-04 DIAGNOSIS — Z87891 Personal history of nicotine dependence: Secondary | ICD-10-CM | POA: Insufficient documentation

## 2017-06-04 DIAGNOSIS — C8588 Other specified types of non-Hodgkin lymphoma, lymph nodes of multiple sites: Secondary | ICD-10-CM | POA: Diagnosis present

## 2017-06-04 DIAGNOSIS — R1900 Intra-abdominal and pelvic swelling, mass and lump, unspecified site: Secondary | ICD-10-CM | POA: Diagnosis not present

## 2017-06-04 DIAGNOSIS — M549 Dorsalgia, unspecified: Secondary | ICD-10-CM | POA: Diagnosis not present

## 2017-06-04 DIAGNOSIS — I700 Atherosclerosis of aorta: Secondary | ICD-10-CM | POA: Insufficient documentation

## 2017-06-04 DIAGNOSIS — I2510 Atherosclerotic heart disease of native coronary artery without angina pectoris: Secondary | ICD-10-CM | POA: Diagnosis not present

## 2017-06-04 DIAGNOSIS — E039 Hypothyroidism, unspecified: Secondary | ICD-10-CM | POA: Diagnosis not present

## 2017-06-04 DIAGNOSIS — Z803 Family history of malignant neoplasm of breast: Secondary | ICD-10-CM | POA: Diagnosis not present

## 2017-06-04 DIAGNOSIS — Z87442 Personal history of urinary calculi: Secondary | ICD-10-CM

## 2017-06-04 DIAGNOSIS — K760 Fatty (change of) liver, not elsewhere classified: Secondary | ICD-10-CM

## 2017-06-04 DIAGNOSIS — Z79899 Other long term (current) drug therapy: Secondary | ICD-10-CM | POA: Insufficient documentation

## 2017-06-04 LAB — COMPREHENSIVE METABOLIC PANEL
ALK PHOS: 92 U/L (ref 38–126)
ALT: 16 U/L (ref 14–54)
ANION GAP: 9 (ref 5–15)
AST: 22 U/L (ref 15–41)
Albumin: 4.3 g/dL (ref 3.5–5.0)
BILIRUBIN TOTAL: 0.6 mg/dL (ref 0.3–1.2)
BUN: 13 mg/dL (ref 6–20)
CALCIUM: 9.7 mg/dL (ref 8.9–10.3)
CO2: 26 mmol/L (ref 22–32)
Chloride: 105 mmol/L (ref 101–111)
Creatinine, Ser: 0.74 mg/dL (ref 0.44–1.00)
Glucose, Bld: 106 mg/dL — ABNORMAL HIGH (ref 65–99)
Potassium: 3.7 mmol/L (ref 3.5–5.1)
SODIUM: 140 mmol/L (ref 135–145)
TOTAL PROTEIN: 7.1 g/dL (ref 6.5–8.1)

## 2017-06-04 LAB — CBC WITH DIFFERENTIAL/PLATELET
Basophils Absolute: 0 10*3/uL (ref 0–0.1)
Basophils Relative: 0 %
EOS ABS: 0 10*3/uL (ref 0–0.7)
Eosinophils Relative: 0 %
HCT: 41.6 % (ref 35.0–47.0)
HEMOGLOBIN: 14.4 g/dL (ref 12.0–16.0)
LYMPHS ABS: 3.3 10*3/uL (ref 1.0–3.6)
Lymphocytes Relative: 21 %
MCH: 32.4 pg (ref 26.0–34.0)
MCHC: 34.5 g/dL (ref 32.0–36.0)
MCV: 94 fL (ref 80.0–100.0)
MONOS PCT: 11 %
Monocytes Absolute: 1.7 10*3/uL — ABNORMAL HIGH (ref 0.2–0.9)
NEUTROS PCT: 68 %
Neutro Abs: 10.7 10*3/uL — ABNORMAL HIGH (ref 1.4–6.5)
Platelets: 286 10*3/uL (ref 150–440)
RBC: 4.43 MIL/uL (ref 3.80–5.20)
RDW: 14.8 % — ABNORMAL HIGH (ref 11.5–14.5)
WBC: 15.6 10*3/uL — ABNORMAL HIGH (ref 3.6–11.0)

## 2017-06-04 LAB — LACTATE DEHYDROGENASE: LDH: 152 U/L (ref 98–192)

## 2017-06-04 NOTE — Progress Notes (Signed)
[place] OFFICE PROGRESS NOTE  Patient Care Team: Tracie Harrier, MD as PCP - General (Internal Medicine) Benjaman Kindler, MD as Consulting Physician (Obstetrics and Gynecology) Clent Jacks, RN as Registered Nurse  Cancer Staging No matching staging information was found for the patient.   Oncology History   # MAY 2015- Diagnosis of   low-grade B-cell lymphoma marginal zone.  From the left axillary lymph node biopsy.  Patient had a weekly rituximab therapy in May 2015 # April 2017-   Progressive disease with enlargement of lymphadenopathy on a subsequent CT scan;APril 2017- Rituxan weekly x4  #  Right adnexal mass and followed by Dr. Retia Passe for 2 years]; monitored.       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  female pleasant patient above history of marginal zone lymphoma recurrent status post most recently treated in March April 2017; and also history of right ovarian mass is here for follow-up.  Patient declined GYN evaluation- as she feels her ovarian mass is benign/stable.  Interim patient was also diagnosed with right lower extremity shingles- status posttreatment with antiviral therapy. Currently lesions have scabbed/resolved. Denies any postherpetic neuralgia  Her back pain is improved since getting injections.she continues to have  chronic knee pain. Patient not too keen on surgery- for her back and knee this time. No fevers. Appetite is good. No weight loss. Denies any new lumps or bumps. No chest pain or shortness of breath or cough.  REVIEW OF SYSTEMS:  A complete 10 point review of system is done which is negative except mentioned above/history of present illness.   PAST MEDICAL HISTORY :  Past Medical History:  Diagnosis Date  . Anxiety   . GERD  (gastroesophageal reflux disease)   . History of appendectomy    30 plus yrs ago  . Hypertension   . Hypothyroidism   . Kidney stones   . Non Hodgkin's lymphoma ([place]) 2015  . S/P partial hysterectomy   . Thyroid disease    hypothyroidism    PAST SURGICAL HISTORY :   Past Surgical History:  Procedure Laterality Date  . ABDOMINAL HYSTERECTOMY    . APPENDECTOMY    . AXILLARY LYMPH NODE BIOPSY Left 2015  . BREAST BIOPSY Right 07/15/1995   stereotactic biopsy at [facility]  . CARPAL TUNNEL RELEASE    . CERVICAL SPINE SURGERY    . COLONOSCOPY WITH PROPOFOL N/A 12/20/2015   Procedure: COLONOSCOPY WITH PROPOFOL;  Surgeon: Hulen Luster, MD;  Location: [facility] ENDOSCOPY;  Service: Gastroenterology;  Laterality: N/A;  . ESOPHAGOGASTRODUODENOSCOPY (EGD) WITH PROPOFOL N/A 12/20/2015   Procedure: ESOPHAGOGASTRODUODENOSCOPY (EGD) WITH PROPOFOL;  Surgeon: Hulen Luster, MD;  Location: [facility] ENDOSCOPY;  Service: Gastroenterology;  Laterality: N/A;  . JOINT REPLACEMENT Left 01/2015   knee  . TONSILLECTOMY      FAMILY HISTORY :   Family History  Problem Relation Age of Onset  . Cancer Other        breast cancer, colon cancer, lymphoma, ovarian cancer  . Diabetes Other   . Anemia Other   . CAD Other   . Liver disease Other   . Breast cancer Daughter 31  . Lymphoma Mother   . Pancreatic cancer Father   . Liver cancer Father     SOCIAL  HISTORY:   Social History  Substance Use Topics  . Smoking status: Former Smoker    Packs/day: 1.00    Years: 20.00    Quit date: 01/27/1994  . Smokeless tobacco: Never Used  . Alcohol use No    ALLERGIES:  is allergic to metoclopramide; percocet [oxycodone-acetaminophen]; and sulfa antibiotics.  MEDICATIONS:  Current Outpatient Prescriptions  Medication Sig Dispense Refill  . ALPRAZolam (XANAX) 0.25 MG tablet Take 0.25 mg by mouth at bedtime as needed for anxiety.    Marland Kitchen amitriptyline (ELAVIL) 25 MG tablet Take 25 mg by mouth as needed (for head aches).     Marland Kitchen aspirin  81 MG tablet Take 81 mg by mouth every morning. Reported on 02/06/2016    . fluticasone (FLONASE) 50 MCG/ACT nasal spray Place into the nose as needed.     . gabapentin (NEURONTIN) 100 MG capsule Take 1 capsule by mouth 2 (two) times daily.    Marland Kitchen levothyroxine (SYNTHROID, LEVOTHROID) 112 MCG tablet Take 112 mcg by mouth daily before breakfast.    . meclizine (ANTIVERT) 25 MG tablet Take 25 mg by mouth as needed.    Marland Kitchen omeprazole (PRILOSEC) 20 MG capsule Take 20 mg by mouth every morning.      No current facility-administered medications for this visit.     PHYSICAL EXAMINATION: ECOG PERFORMANCE STATUS: 0 - Asymptomatic  BP 125/82   Pulse 88   Temp 98.2 F (36.8 C) (Tympanic)   Resp 20   Ht 5\' 5"  (1.651 m)   Wt 212 lb (96.2 kg)   BMI 35.28 kg/m   Filed Weights   06/04/17 1028  Weight: 212 lb (96.2 kg)    GENERAL: Well-nourished well-developed; Alert, no distress and comfortable.   Obese. She is alone. EYES: no pallor or icterus OROPHARYNX: no thrush or ulceration; good dentition  NECK: supple, no masses felt LYMPH:  no palpable lymphadenopathy in the cervical, axillary. Approximately 1 cm sized lymph nodes felt in the left inguinal region. LUNGS: clear to auscultation and  No wheeze or crackles HEART/CVS: regular rate & rhythm and no murmurs; No lower extremity edema ABDOMEN:abdomen soft, non-tender and normal bowel sounds Musculoskeletal:no cyanosis of digits and no clubbing  PSYCH: alert & oriented x 3 with fluent speech NEURO: no focal motor/sensory deficits SKIN:  no rashes or significant lesions  LABORATORY DATA:  I have reviewed the data as listed    Component Value Date/Time   NA 140 06/04/2017 0947   NA 139 01/08/2015 1058   K 3.7 06/04/2017 0947   K 4.3 01/08/2015 1058   CL 105 06/04/2017 0947   CL 107 01/08/2015 1058   CO2 26 06/04/2017 0947   CO2 26 01/08/2015 1058   GLUCOSE 106 (H) 06/04/2017 0947   GLUCOSE 96 01/08/2015 1058   BUN 13 06/04/2017 0947    BUN 11 01/08/2015 1058   CREATININE 0.74 06/04/2017 0947   CREATININE 0.63 01/08/2015 1058   CALCIUM 9.7 06/04/2017 0947   CALCIUM 9.3 01/08/2015 1058   PROT 7.1 06/04/2017 0947   PROT 7.0 07/11/2014 1010   ALBUMIN 4.3 06/04/2017 0947   ALBUMIN 3.6 07/11/2014 1010   AST 22 06/04/2017 0947   AST 23 07/11/2014 1010   ALT 16 06/04/2017 0947   ALT 25 07/11/2014 1010   ALKPHOS 92 06/04/2017 0947   ALKPHOS 84 07/11/2014 1010   BILITOT 0.6 06/04/2017 0947   BILITOT 0.4 07/11/2014 1010   GFRNONAA >60 06/04/2017 0947   GFRNONAA >60 01/08/2015 1058  GFRNONAA >60 07/11/2014 1010   GFRAA >60 06/04/2017 0947   GFRAA >60 01/08/2015 1058   GFRAA >60 07/11/2014 1010    No results found for: SPEP, UPEP  Lab Results  Component Value Date   WBC 15.6 (H) 06/04/2017   NEUTROABS 10.7 (H) 06/04/2017   HGB 14.4 06/04/2017   HCT 41.6 06/04/2017   MCV 94.0 06/04/2017   PLT 286 06/04/2017      Chemistry      Component Value Date/Time   NA 140 06/04/2017 0947   NA 139 01/08/2015 1058   K 3.7 06/04/2017 0947   K 4.3 01/08/2015 1058   CL 105 06/04/2017 0947   CL 107 01/08/2015 1058   CO2 26 06/04/2017 0947   CO2 26 01/08/2015 1058   BUN 13 06/04/2017 0947   BUN 11 01/08/2015 1058   CREATININE 0.74 06/04/2017 0947   CREATININE 0.63 01/08/2015 1058      Component Value Date/Time   CALCIUM 9.7 06/04/2017 0947   CALCIUM 9.3 01/08/2015 1058   ALKPHOS 92 06/04/2017 0947   ALKPHOS 84 07/11/2014 1010   AST 22 06/04/2017 0947   AST 23 07/11/2014 1010   ALT 16 06/04/2017 0947   ALT 25 07/11/2014 1010   BILITOT 0.6 06/04/2017 0947   BILITOT 0.4 07/11/2014 1010     IMPRESSION: 1. Compare to 12/14/2015 CT, decreased size of nodes within the chest and pelvis, consistent with response to therapy. Compared to most recent PET of 08/29/2014, overall similar low-level hypermetabolism within these nodes. 2. Similar size of a right pelvic lesion which is again favored to be ovarian in origin.  This demonstrates similar low-level hypermetabolism compared to 2015. Favor a benign ovarian neoplasm. 3.  Coronary artery atherosclerosis. Aortic atherosclerosis. 4. Hepatic steatosis. 5. Right nephrolithiasis. Electronically Signed   By: Abigail Miyamoto M.D.   On: 07/03/2016 14:17 -------------------------------------------------------------------------------------------------------------------------  IMPRESSION: 1. Mild increase in hypermetabolic activity associated with the left pelvic sidewall lymph node and bilateral axillary lymph nodes. The left pelvic sidewall node is Deau[place] 4 and the axillary nodes are [place] 3. 2. Sigmoid colon diverticulosis. 3. Pars defects at L5. 4. Coronary, aortic arch, and branch vessel atherosclerotic vascular disease. 5. Right ovarian lesion has stable size and low-grade activity over the last several years, likely a benign neoplastic process.   Electronically Signed   By: Van Clines M.D.   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 FEB 2018- PET scan slightly worse [increase in few mm].  Currently asymptomatic. Continue surveillance. We will get a PET scan again in February 2019.  # Lower abdominal pain/discomfort- unclear etiology- clinically I do not think this is related to her history of lymphoma. STABLE/slight improved.   # right ovarian mass- declines any GYN oncology evaluation.  # Right leg from shingles- improved.  # back pain s/p injection-improvement noted.  #  Labs/ PET scan prior in feb 2019  Cc; Dr.Hande  Orders Placed This Encounter  Procedures  . NM PET Image Restag (PS) Skull Base To Thigh    Standing Status:   Future    Standing Expiration Date:   08/04/2018    Order Specific Question:   Reason for Exam (SYMPTOM  OR DIAGNOSIS REQUIRED)     Answer:   lymphoma    Order Specific Question:   Preferred imaging location?  Answer:   [facility]  . CBC with Differential/Platelet    Standing Status:   Future    Standing Expiration Date:   06/04/2018  . Comprehensive metabolic panel    Standing Status:   Future    Standing Expiration Date:   06/04/2018   All questions were answered. The patient knows to call the clinic with any problems, questions or concerns. Reviewed the recent labs from her PCP's office. Normal limits.     Cammie Sickle, MD 06/05/2017 7:32 PM

## 2017-06-04 NOTE — Progress Notes (Signed)
Patient recovering from an episode of shingles on her right leg and lower back. She was tx with acyclovir.

## 2017-06-04 NOTE — Assessment & Plan Note (Addendum)
#   Marginal Zone lymphoma-recurrent most recently treated in March/April 2017 with Rituxan x4. FEB 2018- PET scan slightly worse [increase in few mm].  Currently asymptomatic. Continue surveillance. We will get a PET scan again in February 2019.  # Lower abdominal pain/discomfort- unclear etiology- clinically I do not think this is related to her history of lymphoma. STABLE/slight improved.   # right ovarian mass- declines any GYN oncology evaluation.  # Right leg from shingles- improved.  # back pain s/p injection-improvement noted.  #  Labs/ PET scan prior in feb 2019  Cc; Dr.Hande

## 2017-08-18 ENCOUNTER — Emergency Department: Payer: Medicare Other

## 2017-08-18 ENCOUNTER — Encounter: Payer: Self-pay | Admitting: *Deleted

## 2017-08-18 ENCOUNTER — Emergency Department
Admission: EM | Admit: 2017-08-18 | Discharge: 2017-08-18 | Disposition: A | Discharge status: Home / Self Care | Payer: Medicare Other | Attending: Emergency Medicine | Admitting: Emergency Medicine

## 2017-08-18 DIAGNOSIS — Z96652 Presence of left artificial knee joint: Secondary | ICD-10-CM | POA: Insufficient documentation

## 2017-08-18 DIAGNOSIS — N3091 Cystitis, unspecified with hematuria: Secondary | ICD-10-CM | POA: Diagnosis not present

## 2017-08-18 DIAGNOSIS — I10 Essential (primary) hypertension: Secondary | ICD-10-CM | POA: Insufficient documentation

## 2017-08-18 DIAGNOSIS — N3090 Cystitis, unspecified without hematuria: Secondary | ICD-10-CM

## 2017-08-18 DIAGNOSIS — Z87891 Personal history of nicotine dependence: Secondary | ICD-10-CM | POA: Diagnosis not present

## 2017-08-18 DIAGNOSIS — Z7982 Long term (current) use of aspirin: Secondary | ICD-10-CM | POA: Insufficient documentation

## 2017-08-18 DIAGNOSIS — R310 Gross hematuria: Secondary | ICD-10-CM | POA: Diagnosis not present

## 2017-08-18 DIAGNOSIS — R109 Unspecified abdominal pain: Secondary | ICD-10-CM | POA: Insufficient documentation

## 2017-08-18 DIAGNOSIS — Z8572 Personal history of non-Hodgkin lymphomas: Secondary | ICD-10-CM | POA: Diagnosis not present

## 2017-08-18 DIAGNOSIS — E039 Hypothyroidism, unspecified: Secondary | ICD-10-CM | POA: Insufficient documentation

## 2017-08-18 DIAGNOSIS — Z79899 Other long term (current) drug therapy: Secondary | ICD-10-CM | POA: Insufficient documentation

## 2017-08-18 LAB — COMPREHENSIVE METABOLIC PANEL
ALK PHOS: 91 U/L (ref 38–126)
ALT: 18 U/L (ref 14–54)
AST: 21 U/L (ref 15–41)
Albumin: 4.2 g/dL (ref 3.5–5.0)
Anion gap: 9 (ref 5–15)
BILIRUBIN TOTAL: 0.7 mg/dL (ref 0.3–1.2)
BUN: 18 mg/dL (ref 6–20)
CALCIUM: 9.5 mg/dL (ref 8.9–10.3)
CO2: 24 mmol/L (ref 22–32)
CREATININE: 0.94 mg/dL (ref 0.44–1.00)
Chloride: 106 mmol/L (ref 101–111)
Glucose, Bld: 109 mg/dL — ABNORMAL HIGH (ref 65–99)
Potassium: 4.2 mmol/L (ref 3.5–5.1)
Sodium: 139 mmol/L (ref 135–145)
TOTAL PROTEIN: 6.5 g/dL (ref 6.5–8.1)

## 2017-08-18 LAB — URINALYSIS, COMPLETE (UACMP) WITH MICROSCOPIC
Bacteria, UA: NONE SEEN
SPECIFIC GRAVITY, URINE: 1.009 (ref 1.005–1.030)
Squamous Epithelial / LPF: NONE SEEN

## 2017-08-18 LAB — CBC
HCT: 40.6 % (ref 35.0–47.0)
Hemoglobin: 14.1 g/dL (ref 12.0–16.0)
MCH: 31.9 pg (ref 26.0–34.0)
MCHC: 34.8 g/dL (ref 32.0–36.0)
MCV: 91.7 fL (ref 80.0–100.0)
PLATELETS: 257 10*3/uL (ref 150–440)
RBC: 4.43 MIL/uL (ref 3.80–5.20)
RDW: 13.6 % (ref 11.5–14.5)
WBC: 11.9 10*3/uL — AB (ref 3.6–11.0)

## 2017-08-18 MED ORDER — CEFTRIAXONE SODIUM 1 G IJ SOLR
1.0000 g | Freq: Once | INTRAMUSCULAR | Status: AC
  Administered 2017-08-18: 1 g via INTRAVENOUS
  Filled 2017-08-18: qty 10

## 2017-08-18 MED ORDER — CEPHALEXIN 500 MG PO CAPS: 500.0000 mg | ORAL_CAPSULE | Freq: Three times a day (TID) | ORAL | 0 refills | Status: AC

## 2017-08-18 MED ORDER — MORPHINE SULFATE (PF) 4 MG/ML IV SOLN
4.0000 mg | Freq: Once | INTRAVENOUS | Status: AC
  Administered 2017-08-18: 4 mg via INTRAVENOUS
  Filled 2017-08-18: qty 1

## 2017-08-18 MED ORDER — ONDANSETRON HCL 4 MG/2ML IJ SOLN
4.0000 mg | Freq: Once | INTRAMUSCULAR | Status: AC
  Administered 2017-08-18: 4 mg via INTRAVENOUS
  Filled 2017-08-18: qty 2

## 2017-08-18 MED ORDER — KETOROLAC TROMETHAMINE 30 MG/ML IJ SOLN
30.0000 mg | Freq: Once | INTRAMUSCULAR | Status: AC
  Administered 2017-08-18: 30 mg via INTRAVENOUS
  Filled 2017-08-18: qty 1

## 2017-08-18 MED ORDER — SODIUM CHLORIDE 0.9 % IV BOLUS (SEPSIS)
1000.0000 mL | Freq: Once | INTRAVENOUS | Status: AC
  Administered 2017-08-18: 1000 mL via INTRAVENOUS

## 2017-08-18 NOTE — ED Provider Notes (Signed)
[facility] Emergency Department Provider Note   ____________________________________________   First MD Initiated Contact with Patient 08/18/17 1250     (approximate)  I have reviewed the triage vital signs and the nursing notes.   HISTORY  Chief Complaint Flank Pain    HPI Kaitlin Thompson is a 66 y.o. female who comes into the hospital today with some right-sided flank pain. The patient is also having some pain in the lower abdomen. She is also noticed that she's been urinating blood with some clots. The patient had some burning with urination earlier today but it got worse around 9 PM. The patient has a history of kidney stones but has never had blood in her urine. The patient's last time was in 2004. The patient denies any nausea or vomiting but has had some chills. The patient's pain is 10 out of 10 in intensity. She took a hydrocodone at 11 PM and also took some gabapentin. The patient was concerned and very uncomfortable so she decided to come into the hospital today for evaluation.    Past Medical History:  Diagnosis Date  . Anxiety   . GERD (gastroesophageal reflux disease)   . History of appendectomy    30 plus yrs ago  . Hypertension   . Hypothyroidism   . Kidney stones   . Non Hodgkin's lymphoma ([place]) 2015  . S/P partial hysterectomy   . Thyroid disease    hypothyroidism    Patient Active Problem List   Diagnosis Date Noted  . 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 Mass of right ovary 01/28/2016  . Anxiety 01/28/2016  . Arthritis 01/28/2016  . Adenopathy 01/28/2016  . Headache, migraine 01/28/2016  . Adult hypothyroidism 01/28/2016  . BP (high blood pressure) 01/28/2016  . DD (diverticular disease) 01/28/2016  . Acid reflux 01/28/2016  . Degeneration of intervertebral disc of lumbar region 11/06/2015  . Non-Hodgkin's lymphoma ([place]) 11/29/2014  . Degenerative arthritis of lumbar spine  04/27/2014  . Lumbar canal stenosis 04/27/2014  . Neuritis or radiculitis due to rupture of lumbar intervertebral disc 04/27/2014    Past Surgical History:  Procedure Laterality Date  . ABDOMINAL HYSTERECTOMY    . APPENDECTOMY    . AXILLARY LYMPH NODE BIOPSY Left 2015  . BREAST BIOPSY Right 07/15/1995   stereotactic biopsy at [facility]  . CARPAL TUNNEL RELEASE    . CERVICAL SPINE SURGERY    . COLONOSCOPY WITH PROPOFOL N/A 12/20/2015   Procedure: COLONOSCOPY WITH PROPOFOL;  Surgeon: Hulen Luster, MD;  Location: [facility] ENDOSCOPY;  Service: Gastroenterology;  Laterality: N/A;  . ESOPHAGOGASTRODUODENOSCOPY (EGD) WITH PROPOFOL N/A 12/20/2015   Procedure: ESOPHAGOGASTRODUODENOSCOPY (EGD) WITH PROPOFOL;  Surgeon: Hulen Luster, MD;  Location: [facility] ENDOSCOPY;  Service: Gastroenterology;  Laterality: N/A;  . JOINT REPLACEMENT Left 01/2015   knee  . TONSILLECTOMY      Prior to Admission medications   Medication Sig Start Date End Date Taking? Authorizing Provider  ALPRAZolam Duanne Moron) 0.25 MG tablet Take 0.25 mg by mouth at bedtime as needed for anxiety.   Yes [provider]  amitriptyline (ELAVIL) 25 MG tablet Take 25 mg by mouth as needed (for head aches).    Yes [provider]  aspirin 81 MG tablet Take 81 mg by mouth every morning. Reported on 02/06/2016   Yes [provider]  fluticasone (FLONASE) 50 MCG/ACT nasal spray Place 1 spray into both nostrils daily.  08/06/15  Yes [provider]  gabapentin (NEURONTIN) 100 MG capsule Take 1 capsule by mouth 2 (two) times daily. 05/14/17 05/14/18 Yes [provider]  levothyroxine (SYNTHROID, LEVOTHROID) 112 MCG tablet Take 112 mcg by mouth daily before breakfast.   Yes [provider]  meclizine (ANTIVERT) 25 MG tablet Take 25 mg by mouth as needed. 12/15/16  Yes [provider]  omeprazole (PRILOSEC) 20 MG capsule Take 20 mg by mouth every morning.    Yes [provider]  cephALEXin (KEFLEX) 500 MG  capsule Take 1 capsule (500 mg total) by mouth 3 (three) times daily. 08/18/17 08/28/17  Loney Hering, MD    Allergies Metoclopramide; Percocet [oxycodone-acetaminophen]; and Sulfa antibiotics  Family History  Problem Relation Age of Onset  . Cancer Other        breast cancer, colon cancer, lymphoma, ovarian cancer  . Diabetes Other   . Anemia Other   . CAD Other   . Liver disease Other   . Breast cancer Daughter 42  . Lymphoma Mother   . Pancreatic cancer Father   . Liver cancer Father     Social History Social History  Substance Use Topics  . Smoking status: Former Smoker    Packs/day: 1.00    Years: 20.00    Quit date: 01/27/1994  . Smokeless tobacco: Never Used  . Alcohol use No    Review of Systems  Constitutional: No fever/chills Eyes: No visual changes. ENT: No sore throat. Cardiovascular: Denies chest pain. Respiratory: Denies shortness of breath. Gastrointestinal: No abdominal pain.  No nausea, no vomiting.  No diarrhea.  No constipation. Genitourinary: dysuria and hematuria Musculoskeletal:  back pain. Skin: Negative for rash. Neurological: Negative for headaches, focal weakness or numbness.   ____________________________________________   PHYSICAL EXAM:  VITAL SIGNS: ED Triage Vitals  Enc Vitals Group     BP 08/18/17 0035 140/86     Pulse Rate 08/18/17 0035 (!) 106     Resp 08/18/17 0035 18     Temp 08/18/17 0035 97.9 F (36.6 C)     Temp Source 08/18/17 0035 Oral     SpO2 08/18/17 0035 97 %     Weight 08/18/17 0040 193 lb (87.5 kg)     Height 08/18/17 0040 5\' 4"  (1.626 m)     Head Circumference --      Peak Flow --      Pain Score 08/18/17 0039 10     Pain Loc --      Pain Edu? --      Excl. in [place]? --     Constitutional: Alert and oriented. Well appearing and in moderate distress. Eyes: Conjunctivae are normal. PERRL. EOMI. Head: Atraumatic. Nose: No congestion/rhinnorhea. Mouth/Throat: Mucous membranes are moist.  Oropharynx  non-erythematous. Cardiovascular: Normal rate, regular rhythm. Grossly normal heart sounds.  Good peripheral circulation. Respiratory: Normal respiratory effort.  No retractions. Lungs CTAB. Gastrointestinal: Soft . No distention. positive bowel sounds right CVA tenderness to palpation mild superior pubic tenderness to palpation Musculoskeletal: No lower extremity tenderness nor edema.   Neurologic:  Normal speech and language.  Skin:  Skin is warm, dry and intact. Marland Kitchen Psychiatric: Mood and affect are normal.   ____________________________________________   LABS (all labs ordered are listed, but only abnormal results are displayed)  Labs Reviewed  CBC - Abnormal; Notable for the following:       Result Value   WBC 11.9 (*)    All other components within normal limits  COMPREHENSIVE METABOLIC PANEL - Abnormal;  Notable for the following:    Glucose, Bld 109 (*)    All other components within normal limits  URINALYSIS, COMPLETE (UACMP) WITH MICROSCOPIC - Abnormal; Notable for the following:    Color, Urine RED (*)    APPearance CLOUDY (*)    Glucose, UA   (*)    Value: TEST NOT REPORTED DUE TO COLOR INTERFERENCE OF URINE PIGMENT   Hgb urine dipstick   (*)    Value: TEST NOT REPORTED DUE TO COLOR INTERFERENCE OF URINE PIGMENT   Bilirubin Urine   (*)    Value: TEST NOT REPORTED DUE TO COLOR INTERFERENCE OF URINE PIGMENT   Ketones, ur   (*)    Value: TEST NOT REPORTED DUE TO COLOR INTERFERENCE OF URINE PIGMENT   Protein, ur   (*)    Value: TEST NOT REPORTED DUE TO COLOR INTERFERENCE OF URINE PIGMENT   Nitrite   (*)    Value: TEST NOT REPORTED DUE TO COLOR INTERFERENCE OF URINE PIGMENT   Leukocytes, UA   (*)    Value: TEST NOT REPORTED DUE TO COLOR INTERFERENCE OF URINE PIGMENT   All other components within normal limits  URINE CULTURE   ____________________________________________  EKG  none ____________________________________________  RADIOLOGY  Ct Renal Stone  Study  Result Date: 08/18/2017 CLINICAL DATA:  Acute onset left side back pain. Hematuria. History of urinary tract stones. EXAM: CT ABDOMEN AND PELVIS WITHOUT CONTRAST TECHNIQUE: Multidetector CT imaging of the abdomen and pelvis was performed following the standard protocol without IV contrast. COMPARISON:  PET CT scan 01/09/2017. CT chest, abdomen and pelvis 12/14/2015. FINDINGS: Lower chest: There is mild dependent basilar atelectasis. Lung bases otherwise clear. No pleural or pericardial effusion. Hepatobiliary: The liver measures 22 cm craniocaudal. Attenuation is normal. No focal liver lesion. The gallbladder and biliary tree are unremarkable. Pancreas: Unremarkable. No pancreatic ductal dilatation or surrounding inflammatory changes. Spleen: Normal in size without focal abnormality. Adrenals/Urinary Tract: Small left adrenal adenoma is unchanged. Small nodule in the right adrenal gland is also likely an adenoma and unchanged. Punctate nonobstructing stone upper pole right kidney is seen. No other urinary tract stones. No hydronephrosis. Small cyst off the lower pole the right kidney is identified. The urinary bladder is nearly completely decompressed and otherwise unremarkable. Stomach/Bowel: Sigmoid diverticulosis without diverticulitis is identified. The stomach and small bowel appear normal. The appendix is not visualized and may have been removed. Vascular/Lymphatic: Left pelvic sidewall lymph node measuring 2.1 cm on image 69 is unchanged. Scattered atherosclerotic vascular disease is noted. Reproductive: The patient is status post hysterectomy. Soft tissue attenuating structure likely representing the right ovary measures 4.1 x 3.2 cm, unchanged. Other: No fluid collection.  No hernia. 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 IMPRESSION: No change in a small nonobstructing stone in the upper pole the right kidney. Negative  for hydronephrosis. Negative for left urinary tract stones. Diverticulosis without diverticulitis. Hepatomegaly. Unchanged nodularity of the adrenal glands likely representing small adenomas. No change in pelvic lymphadenopathy. Electronically Signed   By: Inge Rise M.D.   On: 08/18/2017 01:40    ____________________________________________   PROCEDURES  Procedure(s) performed: None  Procedures  Critical Care performed: No  ____________________________________________   INITIAL IMPRESSION / ASSESSMENT AND PLAN / ED COURSE  Pertinent labs & imaging results that were available during my care of the patient were reviewed by me and considered in my medical decision making (see chart for details).  this is a 66 year old female who comes into the hospital today with some right flank pain and hematuria. My initial thought was that the patient was having a kidney stone given her history and her symptoms. I sent the patient for CT scan and then gave her a liter of normal saline as well as some morphine and Zofran. The patient's CT returned and stated that she did have some nonobstructing kidney stones in the upper pole of the right kidney but denied seeing any ureteral stones. I did receive the results of the patient's urinalysis and it does have too numerous to count red blood cells and white blood cells. While the CT does not show any kidney stones I decided to give the patient a dose of Toradol as well as some ceftriaxone for cystitis. The patient's pain was improved significantly. I will encourage the patient to hydrate with some water and I will give her prescription for some antibiotics. The patient will be discharged home to follow-up with urology. The patient has no further complaints or concerns at this time.      ____________________________________________   FINAL CLINICAL IMPRESSION(S) / ED DIAGNOSES  Final diagnoses:  Flank pain  Gross hematuria  Cystitis      NEW  MEDICATIONS STARTED DURING THIS VISIT:  New Prescriptions   CEPHALEXIN (KEFLEX) 500 MG CAPSULE    Take 1 capsule (500 mg total) by mouth 3 (three) times daily.     Note:  This document was prepared using Dragon voice recognition software and may include unintentional dictation errors.    Loney Hering, MD 08/18/17 [phone]

## 2017-08-18 NOTE — Discharge Instructions (Signed)
PLease follow up with urology.

## 2017-08-18 NOTE — ED Triage Notes (Signed)
Pt has sudden onset of back pain and left side pain.  Pt has hematuria.  Hx kidney stones. Pt alert.

## 2017-08-19 ENCOUNTER — Ambulatory Visit (INDEPENDENT_AMBULATORY_CARE_PROVIDER_SITE_OTHER): Payer: Medicare Other | Admitting: Podiatry

## 2017-08-19 DIAGNOSIS — M7662 Achilles tendinitis, left leg: Secondary | ICD-10-CM

## 2017-08-19 MED ORDER — DICLOFENAC SODIUM 75 MG PO TBEC: 75.0000 mg | DELAYED_RELEASE_TABLET | Freq: Two times a day (BID) | ORAL | 0 refills | Status: DC

## 2017-08-19 NOTE — Progress Notes (Signed)
She presents today for follow-up of her Achilles tendinitis to her left foot. She states that it really has become painful in her right as well she has finished PT and PTT states that there is nothing they can do for her.  Objective: Vital signs are stable alert and oriented 3 pulses are palpable. She still has tenderness on palpation and warmth on palpation of the distal most aspect of the Achilles as it inserts on a large posterior heel spur in the left heel. Previous MRI demonstrated tendinosis in February 2018 however I feel that he is more than likely developed into a tendinopathy with possible split tears since physical therapy seems to be of no benefit.  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  Plan: Started her on diclofenac and requested an MRI for surgical consideration. We are requesting a comparison MRI

## 2017-08-20 LAB — URINE CULTURE

## 2017-08-20 NOTE — Addendum Note (Signed)
Addended by: Graceann Congress D on: 08/20/2017 09:23 AM   Modules accepted: Orders

## 2017-08-21 NOTE — Progress Notes (Signed)
ED Antimicrobial Stewardship Positive Culture Follow Up   Kaitlin Thompson is an 66 y.o. female who presented to [facility] on 08/18/2017 with a chief complaint of  Chief Complaint  Patient presents with  . Flank Pain    Recent Results (from the past 720 hour(s))  Urine Culture     Status: Abnormal   Collection Time: 08/18/17 12:48 AM  Result Value Ref Range Status   Specimen Description URINE, RANDOM  Final   Special Requests NONE  Final   Culture >=100,000 COLONIES/mL ENTEROBACTER AEROGENES (A)  Final   Report Status 08/20/2017 FINAL  Final   Organism ID, Bacteria ENTEROBACTER AEROGENES (A)  Final      Susceptibility   Enterobacter aerogenes - MIC*    CEFAZOLIN >=64 RESISTANT Resistant     CEFTRIAXONE <=1 SENSITIVE Sensitive     CIPROFLOXACIN <=0.25 SENSITIVE Sensitive     GENTAMICIN <=1 SENSITIVE Sensitive     IMIPENEM 2 SENSITIVE Sensitive     NITROFURANTOIN 64 INTERMEDIATE Intermediate     TRIMETH/SULFA <=20 SENSITIVE Sensitive     PIP/TAZO <=4 SENSITIVE Sensitive     * >=100,000 COLONIES/mL ENTEROBACTER AEROGENES    [x]  Treated with Cephalexin, organism not covered/resistant by prescribed antimicrobial.  New antibiotic prescription: Septra DS 1 po bid x 3 days.  Called patient for preferred pharmacy [phone]). Patient prefers [facility] [phone].  Discussed allergies with patient. She is not aware of having a sulfa allergy (see note under Sulfa allergy in chart). Discussed signs of an allergic reaction, rash, hives, swelling, tongue swelling, etc. and to stop medication and call MD or 911 if she developed symptoms.   ED Provider: Cherly Beach A PharmD 08/21/2017, 5:41 PM

## 2017-08-27 ENCOUNTER — Ambulatory Visit
Admission: RE | Admit: 2017-08-27 | Discharge: 2017-08-27 | Disposition: A | Discharge status: Home / Self Care | Payer: Medicare Other | Source: Ambulatory Visit | Attending: Podiatry | Admitting: Podiatry

## 2017-08-27 DIAGNOSIS — M7662 Achilles tendinitis, left leg: Secondary | ICD-10-CM | POA: Diagnosis not present

## 2017-08-27 DIAGNOSIS — M65879 Other synovitis and tenosynovitis, unspecified ankle and foot: Secondary | ICD-10-CM | POA: Diagnosis not present

## 2017-08-31 ENCOUNTER — Telehealth: Payer: Self-pay

## 2017-08-31 NOTE — Telephone Encounter (Signed)
Spoke with patient informing her that her MRI results were reviewed by Dr Milinda Pointer and he is wanting to send off her CD for a more in depth review. Informed her of the delay, Faxed over request for CD from [facility], once it arrives we will mail it off to [facility]

## 2017-09-03 ENCOUNTER — Telehealth: Payer: Self-pay

## 2017-09-03 NOTE — Telephone Encounter (Signed)
-----   Message from Garrel Ridgel, [place] sent at 08/31/2017  7:11 AM EDT ----- Send for an over read and inform patient of the delays

## 2017-09-03 NOTE — Telephone Encounter (Signed)
MRI CD sent to [facility] overread

## 2017-09-14 ENCOUNTER — Telehealth: Payer: Self-pay | Admitting: *Deleted

## 2017-09-14 NOTE — Telephone Encounter (Signed)
Mailed copy of MRI disc to [facility]. 

## 2017-09-16 ENCOUNTER — Encounter: Payer: Self-pay | Admitting: Podiatry

## 2017-10-05 ENCOUNTER — Encounter: Payer: Self-pay | Admitting: Podiatry

## 2017-10-05 ENCOUNTER — Ambulatory Visit (INDEPENDENT_AMBULATORY_CARE_PROVIDER_SITE_OTHER): Payer: Medicare Other | Admitting: Podiatry

## 2017-10-05 DIAGNOSIS — M7661 Achilles tendinitis, right leg: Secondary | ICD-10-CM | POA: Diagnosis not present

## 2017-10-05 DIAGNOSIS — M7662 Achilles tendinitis, left leg: Secondary | ICD-10-CM | POA: Diagnosis not present

## 2017-10-05 DIAGNOSIS — R0989 Other specified symptoms and signs involving the circulatory and respiratory systems: Secondary | ICD-10-CM

## 2017-10-05 NOTE — Progress Notes (Signed)
She presents today for follow-up of her Achilles tendinitis of her left foot. She states that as there is really been no change. She states that even my toes hurt. She states that the right foot is starting to do the same. She does have a history of lymphoma.  Objective: MRI does still state that there is still Achilles tendinitis in the left heel. However her pulses are diminished today from previous evaluation.  Assessment nonpalpable pulses today. Chronic Achilles tendinitis left. History of lymphoma. 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  Plan: We are requesting medical clearance from her primary doctor as well as her oncologist. We are also signing her up today for a retrocalcaneal heel spur resection Achilles and a lysis gastroc recession and endoscopic fasciotomy. She understands this is amenable to it. However prior to the surgery we are requesting that she be seen by vein and vascular to make sure that she has good circulation to the left lower extremity.

## 2017-10-05 NOTE — Patient Instructions (Signed)
Pre-Operative Instructions  Congratulations, you have decided to take an important step towards improving your quality of life.  You can be assured that the doctors and staff at [facility] will be with you every step of the way.  Here are some important things you should know:  1. Plan to be at the surgery center/hospital at least 1 (one) hour prior to your scheduled time, unless otherwise directed by the surgical center/hospital staff.  You must have a responsible adult accompany you, remain during the surgery and drive you home.  Make sure you have directions to the surgical center/hospital to ensure you arrive on time. 2. If you are having surgery at [facility] or [facility] hospitals, you will need a copy of your medical history and physical form from your family physician within one month prior to the date of surgery. We will give you a form for your primary physician to complete.  3. We make every effort to accommodate the date you request for surgery.  However, there are times where surgery dates or times have to be moved.  We will contact you as soon as possible if a change in schedule is required.   4. No aspirin/ibuprofen for one week before surgery.  If you are on aspirin, any non-steroidal anti-inflammatory medications (Mobic, Aleve, Ibuprofen) should not be taken seven (7) days prior to your surgery.  You make take Tylenol for pain prior to surgery.  5. Medications - If you are taking daily heart and blood pressure medications, seizure, reflux, allergy, asthma, anxiety, pain or diabetes medications, make sure you notify the surgery center/hospital before the day of surgery so they can tell you which medications you should take or avoid the day of surgery. 6. No food or drink after midnight the night before surgery unless directed otherwise by surgical center/hospital staff. 7. No alcoholic beverages 24-hours prior to surgery.  No smoking 24-hours prior or 24-hours after  surgery. 8. Wear loose pants or shorts. They should be loose enough to fit over bandages, boots, and casts. 9. Don't wear slip-on shoes. Sneakers are preferred. 10. Bring your boot with you to the surgery center/hospital.  Also bring crutches or a walker if your physician has prescribed it for you.  If you do not have this equipment, it will be provided for you after surgery. 11. If you have not been contacted by the surgery center/hospital by the day before your surgery, call to confirm the date and time of your surgery. 12. Leave-time from work may vary depending on the type of surgery you have.  Appropriate arrangements should be made prior to surgery with your employer. 13. Prescriptions will be provided immediately following surgery by your doctor.  Fill these as soon as possible after surgery and take the medication as directed. Pain medications will not be refilled on weekends and must be approved by the doctor. 14. Remove nail polish on the operative foot and avoid getting pedicures prior to surgery. 15. Wash the night before surgery.  The night before surgery wash the foot and leg well with water and the antibacterial soap provided. Be sure to pay special attention to beneath the toenails and in between the toes.  Wash for at least three (3) minutes. Rinse thoroughly with water and dry well with a towel.  Perform this wash unless told not to do so by your physician.  Enclosed: 1 Ice pack (please put in freezer the night before surgery)   1 Hibiclens skin cleaner     Pre-op instructions  If you have any questions regarding the instructions, please do not hesitate to call our office.  [place]: [address], [place], [place] [address] -- [phone]  [place]: [address] [place], [place] [address] -- [phone]  [place]: [address]  [place], [place] [address] -- [phone]  [place]: [address], [address], [place], [place] [address] -- [phone]  Website:  [email or web address] 

## 2017-10-08 NOTE — Addendum Note (Signed)
Addended by: Graceann Congress D on: 10/08/2017 11:20 AM   Modules accepted: Orders

## 2017-10-09 ENCOUNTER — Ambulatory Visit (INDEPENDENT_AMBULATORY_CARE_PROVIDER_SITE_OTHER): Payer: Medicare Other

## 2017-10-09 DIAGNOSIS — R0989 Other specified symptoms and signs involving the circulatory and respiratory systems: Secondary | ICD-10-CM

## 2017-10-13 ENCOUNTER — Encounter: Payer: Self-pay | Admitting: *Deleted

## 2017-10-16 NOTE — Progress Notes (Signed)
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 Patient has no contraindications from oncologic standpoint to proceed with the procedure. Her last labs checked with [place] were within normal limits. Thank you, Charlaine Dalton MD

## 2017-10-19 ENCOUNTER — Telehealth: Payer: Self-pay | Admitting: *Deleted

## 2017-10-19 ENCOUNTER — Encounter: Payer: Self-pay | Admitting: *Deleted

## 2017-10-19 NOTE — Telephone Encounter (Signed)
I left messages for the patient to give me a call.  I need to find out the name of her primary care physician.

## 2017-10-20 ENCOUNTER — Telehealth: Payer: Self-pay | Admitting: *Deleted

## 2017-10-20 NOTE — Telephone Encounter (Signed)
Pt called for doppler results.

## 2017-10-20 NOTE — Telephone Encounter (Signed)
I had a call to call y'all back. Dr. Milinda Pointer is my doctor. This is in regards to something about my circulation as he is going to do surgery on my foot. If you could call me back whenever I appreciate it. Thank you.

## 2017-10-20 NOTE — Telephone Encounter (Signed)
Vascular study was cleared as normal and she has been cleared by oncology if I recall.  She should be ready for surgery.

## 2017-10-20 NOTE — Telephone Encounter (Signed)
This is Kaitlin Thompson. Someone called and tried to reach me yesterday. I'm returning their phone call. I know I will be in most of the morning but I will be out some this afternoon. If you could please call me back at [phone]. Thank you.

## 2017-10-20 NOTE — Telephone Encounter (Signed)
"  I'm returning your call."  I was calling to get the name of your primary care physician.  Dr. Milinda Pointer wants medical clearance from him.  We have already received it from your Cancer doctor.  "It's Dr. Ginette Pitman.  I had a test done and I still haven't received the results.  I left two messages for the [place] office and no one has called me back."  I will give a message to the nurse and get her to give you a call.

## 2017-10-26 ENCOUNTER — Telehealth: Payer: Self-pay | Admitting: Podiatry

## 2017-10-26 NOTE — Telephone Encounter (Signed)
Patient called to say that she had her vascular study done on 10/09/17 and has not heard anything. Per Dr. Milinda Pointer, he has reviewed the report and would like you to go ahead and schedule surgery with Delydia.  Patient will be away from home until after lunch today, Please call to schedule.

## 2017-10-26 NOTE — Telephone Encounter (Signed)
I sent her report to you last week saying it was ok to schedule surgery.  Did you get it?  Ill look in my sent mail.

## 2017-10-27 ENCOUNTER — Telehealth: Payer: Self-pay | Admitting: *Deleted

## 2017-10-27 NOTE — Telephone Encounter (Signed)
I'm calling to set up your surgery.  "I want it done after Christmas."  He can do it on December 28.  "That date will be fine, what time?"  Someone from the surgical center will give you a call with the arrival time a day or two prior to surgery date.

## 2017-12-02 ENCOUNTER — Telehealth: Payer: Self-pay | Admitting: *Deleted

## 2017-12-02 NOTE — Telephone Encounter (Addendum)
Faxed request for medical clearance to Dr. Charlaine Dalton. Faxed Medical clearance request to Dr. Roetta Sessions.

## 2017-12-03 ENCOUNTER — Telehealth: Payer: Self-pay | Admitting: *Deleted

## 2017-12-03 ENCOUNTER — Other Ambulatory Visit: Payer: Self-pay | Admitting: Podiatry

## 2017-12-03 MED ORDER — HYDROMORPHONE HCL 4 MG PO TABS: 4.0000 mg | ORAL_TABLET | ORAL | 0 refills | Status: DC | PRN

## 2017-12-03 MED ORDER — PROMETHAZINE HCL 25 MG PO TABS: 25.0000 mg | ORAL_TABLET | Freq: Three times a day (TID) | ORAL | 0 refills | Status: DC | PRN

## 2017-12-03 MED ORDER — CLINDAMYCIN HCL 150 MG PO CAPS: 150.0000 mg | ORAL_CAPSULE | Freq: Three times a day (TID) | ORAL | 0 refills | Status: DC

## 2017-12-03 NOTE — Telephone Encounter (Signed)
[facility] states Heather - DR. Brahmanday's nurse states Dr. Rogue Bussing will not be in the rest of the week and pt has not been seen in over 6 months and was only treated for a low grade lymphoma.

## 2017-12-03 NOTE — Telephone Encounter (Signed)
Kaitlin Thompson asked if medical clearance had been obtained for pt. I told him I had faxed request and called both Dr. Ginette Pitman, and Dr. Rogue Bussing, but had not gotten a response. I informed Kaitlin Thompson that in Telephone message 10/20/2017 Dr. Milinda Pointer had said vascular study had been cleared and she had been cleared by oncology as he recalled, and in Telephone message 10/26/2017 Kaitlin Thompson had noted Dr. Milinda Pointer stated had reviewed vascular test and had okayed pt to be scheduled surgery.Dr. Milinda Pointer called, I gave him the information I had given [facility], he stated have Kaitlin Thompson, CMA go through his In-Box for the letters.

## 2017-12-03 NOTE — Telephone Encounter (Signed)
Kaitlin Thompson - Dr. Aletha Halim office states she will send me to his nurses' voicemail, left message requesting follow up on medical clearance for pt's surgery 12/04/2017.

## 2017-12-03 NOTE — Telephone Encounter (Signed)
Received msg from [facility] in cancer center scheduling stating that [place]. Requesting an emergent call back to request for hem/onc clearance from Dr. Rogue Bussing. States surgery is scheduled for tomorrow. Also stated that office has left msgs and has not return call.  Reviewed chart- no phone msgs on chart. I also checked my clinical team vm. No msgs on phone.  I contacted the office back-left msg with receptionist to have Mateo Flow to return my phone call as soon as possible.  Mateo Flow returned my call. I explained that pt has not been seen since June of this year. She is not on active treatment. Pt only seeing Dr. Rogue Bussing for h/o lymphoma. I explained that Dr. Rogue Bussing is out of the office until next week, therefore, hem/onc clearance can not be obtained until md comes back. (unless another provider agrees to see the patient on behalf of Dr. Rogue Bussing). I reviewed chart- appears that Dr. Ginette Pitman has given general medical clearance.  Mateo Flow will inform Dr. Milinda Pointer.  No further intervention is needed at this time. I gave Mateo Flow my personal backdoor number to contact me directly if she needed anything further.

## 2017-12-03 NOTE — Telephone Encounter (Signed)
I spoke to Dr. Aletha Halim call service and she states she will have the office fax over the medical clearance.

## 2017-12-03 NOTE — Telephone Encounter (Signed)
Kaitlin Thompson - Dr. Linton Ham office, states there is paperwork in his tray to be evaluated, and she will speak with his nurse to inform of the urgency.

## 2017-12-04 ENCOUNTER — Encounter: Payer: Self-pay | Admitting: Podiatry

## 2017-12-04 DIAGNOSIS — M65872 Other synovitis and tenosynovitis, left ankle and foot: Secondary | ICD-10-CM | POA: Diagnosis not present

## 2017-12-04 DIAGNOSIS — M7732 Calcaneal spur, left foot: Secondary | ICD-10-CM | POA: Diagnosis not present

## 2017-12-04 DIAGNOSIS — M722 Plantar fascial fibromatosis: Secondary | ICD-10-CM | POA: Diagnosis not present

## 2017-12-04 DIAGNOSIS — M216X2 Other acquired deformities of left foot: Secondary | ICD-10-CM | POA: Diagnosis not present

## 2017-12-04 NOTE — Telephone Encounter (Addendum)
Received medical clearance from Dr. Ginette Pitman. Informed Bryan - [facility] Dr. Ginette Pitman had faxed medical clearance, and Dr. Rogue Bussing was out of the office and his nurse Nira Conn, stated pt had not been seen in 6 months, that she had previously been treated for a low grade lymphoma. Gaspar Bidding - [facility] states he will inform Dr. Milinda Pointer, and to fax Dr. Linton Ham letter to the [phone]. Dr. Linton Ham letter faxed to [facility] main fax.

## 2017-12-09 ENCOUNTER — Ambulatory Visit (INDEPENDENT_AMBULATORY_CARE_PROVIDER_SITE_OTHER): Payer: Medicare Other | Admitting: Podiatry

## 2017-12-09 ENCOUNTER — Encounter: Payer: Self-pay | Admitting: Podiatry

## 2017-12-09 VITALS — BP 93/75 | HR 87 | Resp 16

## 2017-12-09 DIAGNOSIS — M7662 Achilles tendinitis, left leg: Secondary | ICD-10-CM

## 2017-12-09 NOTE — Progress Notes (Signed)
Presents today for follow-up.  Date of surgery 12/04/2017.  Status post endoscopic plantar fasciotomy Achilles tendon lysis and gastrocnemius recession.  States that she is doing very well but I really hate this cast.  I discussed it with it and I am ready to have it removed.  It is too hard for me to try to get around with my knee replacement and a knee scooter.  I am having to use a walker and just hop or use a wheelchair.  She denies fever chills nausea vomiting muscle aches pains chest pain shortness of breath or calf pain.  Objective: Vital signs are stable alert and oriented x3 cast is loose proximally.  She has good range of motion of her toes I can see some swelling is proximal to her toes capillary fill time is immediate she has good sensation dorsally and plantarly with good active range of motion.  Assessment: Well-healing surgical foot.  Plan: Follow-up with me in 1 week for cast change.

## 2017-12-21 ENCOUNTER — Encounter: Payer: Self-pay | Admitting: Podiatry

## 2017-12-21 ENCOUNTER — Ambulatory Visit (INDEPENDENT_AMBULATORY_CARE_PROVIDER_SITE_OTHER): Payer: Medicare Other | Admitting: Podiatry

## 2017-12-21 DIAGNOSIS — M7662 Achilles tendinitis, left leg: Secondary | ICD-10-CM | POA: Diagnosis not present

## 2017-12-21 DIAGNOSIS — M722 Plantar fascial fibromatosis: Secondary | ICD-10-CM

## 2017-12-21 NOTE — Progress Notes (Signed)
She presents today status post gastroc recession left Achilles tendon lysis and an endoscopic plantar fasciotomy.  She states that she would rather be in a Cam walker and not remove it then to be in a cast.  She denies chest pain shortness of breath fever chills nausea vomiting.  Objective: Vital signs are stable she is alert and oriented x3.  Pulses are palpable.  Many of the staples were removed today we left the distalmost aspect of the staples in the posterior incision there is no signs of infection no dehiscence.  Assessment: Well-healing surgical foot.  Plan: Redressed today dressed a compressive dressing placed on a Cam walker she is to remain nonweightbearing status and not to remove Cam walker.  She was questioning whether she can remove it at night I explicitly said no she cannot take it off at bedtime.

## 2018-01-04 ENCOUNTER — Encounter: Payer: Self-pay | Admitting: Podiatry

## 2018-01-04 ENCOUNTER — Ambulatory Visit (INDEPENDENT_AMBULATORY_CARE_PROVIDER_SITE_OTHER): Payer: Medicare Other | Admitting: Podiatry

## 2018-01-04 DIAGNOSIS — M7662 Achilles tendinitis, left leg: Secondary | ICD-10-CM

## 2018-01-04 DIAGNOSIS — M722 Plantar fascial fibromatosis: Secondary | ICD-10-CM

## 2018-01-04 NOTE — Progress Notes (Signed)
She presents today for her postop visit date of surgery 12/04/2017.  Status post EPF Achilles tendon lysis gastroc recession and retrocalcaneal heel spur resection.  Last time she was and she requested a Cam walker rather than a cast.  She denies fever chills nausea vomiting muscle aches pains.  Denies calf pain chest pain shortness of breath.  Continues to utilize a walker and a wheelchair.  She states that she has not been putting weight on the foot.  Objective: Vital signs are stable alert and oriented x3.  dry sterile dressing intact removed demonstrates no erythema edema cellulitis drainage or odor.  Staples remaining were intact margins are well coapted.  She has good plantar flexion against resistance.  Staples were removed.  Assessment well-healing surgical foot and leg.  Plan: Discussed etiology pathology conservative versus surgical therapies.  At this point am requesting that she remain in her Cam walker and that she start with partial weightbearing which was just demonstrated to her today.  She will then progress over the next 2 weeks to full weightbearing and I will follow-up with her in 2 weeks at which time we will place her in a Tri-Lock brace and a tennis shoe.  She will bring a tennis shoe with her at that time.

## 2018-01-20 ENCOUNTER — Encounter: Payer: Medicare Other | Admitting: Podiatry

## 2018-01-27 ENCOUNTER — Ambulatory Visit (INDEPENDENT_AMBULATORY_CARE_PROVIDER_SITE_OTHER): Payer: Medicare Other | Admitting: Podiatry

## 2018-01-27 ENCOUNTER — Encounter: Payer: Self-pay | Admitting: Podiatry

## 2018-01-27 DIAGNOSIS — M7662 Achilles tendinitis, left leg: Secondary | ICD-10-CM

## 2018-01-27 DIAGNOSIS — M722 Plantar fascial fibromatosis: Secondary | ICD-10-CM

## 2018-01-27 NOTE — Progress Notes (Signed)
She presents today for follow-up of her Tina lysis gastroc recession retrocalcaneal heel spur resection all of the left foot.  She also had a endoscopic fasciotomy.  Date of surgery 12/04/2017.  She states that she is doing well she is ready to get out of her boot.  Objective: Vital signs are stable she is alert and oriented x3.  Pulses are palpable.  He has no reproducible pain on palpation or range of motion today.  All incision sites have gone on to heal uneventfully.  Assessment: Well-healing surgical foot left.  Plan: We will allow her to get back in her regular shoe and utilize her crutches until she can walk normally.  On follow-up with her in 1 month.

## 2018-02-04 ENCOUNTER — Encounter
Admission: RE | Admit: 2018-02-04 | Discharge: 2018-02-04 | Disposition: A | Discharge status: Home / Self Care | Payer: Medicare Other | Source: Ambulatory Visit | Attending: Internal Medicine | Admitting: Internal Medicine

## 2018-02-04 DIAGNOSIS — C8588 Other specified types of non-Hodgkin lymphoma, lymph nodes of multiple sites: Secondary | ICD-10-CM | POA: Diagnosis not present

## 2018-02-04 LAB — GLUCOSE, CAPILLARY: GLUCOSE-CAPILLARY: 79 mg/dL (ref 65–99)

## 2018-02-04 MED ORDER — FLUDEOXYGLUCOSE F - 18 (FDG) INJECTION
12.5000 | Freq: Once | INTRAVENOUS | Status: AC | PRN
  Administered 2018-02-04: 12.5 via INTRAVENOUS

## 2018-02-05 ENCOUNTER — Inpatient Hospital Stay (HOSPITAL_BASED_OUTPATIENT_CLINIC_OR_DEPARTMENT_OTHER): Payer: Medicare Other | Admitting: Internal Medicine

## 2018-02-05 ENCOUNTER — Inpatient Hospital Stay: Payer: Medicare Other | Attending: Internal Medicine

## 2018-02-05 ENCOUNTER — Encounter: Payer: Self-pay | Admitting: Internal Medicine

## 2018-02-05 VITALS — BP 157/83 | HR 82 | Temp 97.9°F | Resp 16 | Wt 189.0 lb

## 2018-02-05 DIAGNOSIS — F419 Anxiety disorder, unspecified: Secondary | ICD-10-CM | POA: Insufficient documentation

## 2018-02-05 DIAGNOSIS — J328 Other chronic sinusitis: Secondary | ICD-10-CM | POA: Insufficient documentation

## 2018-02-05 DIAGNOSIS — E039 Hypothyroidism, unspecified: Secondary | ICD-10-CM | POA: Diagnosis not present

## 2018-02-05 DIAGNOSIS — Z800 Family history of malignant neoplasm of digestive organs: Secondary | ICD-10-CM

## 2018-02-05 DIAGNOSIS — I2510 Atherosclerotic heart disease of native coronary artery without angina pectoris: Secondary | ICD-10-CM | POA: Diagnosis not present

## 2018-02-05 DIAGNOSIS — I700 Atherosclerosis of aorta: Secondary | ICD-10-CM | POA: Diagnosis not present

## 2018-02-05 DIAGNOSIS — Z79899 Other long term (current) drug therapy: Secondary | ICD-10-CM | POA: Diagnosis not present

## 2018-02-05 DIAGNOSIS — Z87891 Personal history of nicotine dependence: Secondary | ICD-10-CM | POA: Diagnosis not present

## 2018-02-05 DIAGNOSIS — Z8041 Family history of malignant neoplasm of ovary: Secondary | ICD-10-CM | POA: Insufficient documentation

## 2018-02-05 DIAGNOSIS — R5383 Other fatigue: Secondary | ICD-10-CM | POA: Insufficient documentation

## 2018-02-05 DIAGNOSIS — K5730 Diverticulosis of large intestine without perforation or abscess without bleeding: Secondary | ICD-10-CM | POA: Insufficient documentation

## 2018-02-05 DIAGNOSIS — I10 Essential (primary) hypertension: Secondary | ICD-10-CM | POA: Diagnosis not present

## 2018-02-05 DIAGNOSIS — R634 Abnormal weight loss: Secondary | ICD-10-CM | POA: Insufficient documentation

## 2018-02-05 DIAGNOSIS — C8588 Other specified types of non-Hodgkin lymphoma, lymph nodes of multiple sites: Secondary | ICD-10-CM | POA: Insufficient documentation

## 2018-02-05 DIAGNOSIS — Z87442 Personal history of urinary calculi: Secondary | ICD-10-CM | POA: Insufficient documentation

## 2018-02-05 DIAGNOSIS — Z7982 Long term (current) use of aspirin: Secondary | ICD-10-CM | POA: Diagnosis not present

## 2018-02-05 DIAGNOSIS — Z803 Family history of malignant neoplasm of breast: Secondary | ICD-10-CM | POA: Diagnosis not present

## 2018-02-05 DIAGNOSIS — K219 Gastro-esophageal reflux disease without esophagitis: Secondary | ICD-10-CM | POA: Insufficient documentation

## 2018-02-05 DIAGNOSIS — Z806 Family history of leukemia: Secondary | ICD-10-CM | POA: Diagnosis not present

## 2018-02-05 LAB — CBC WITH DIFFERENTIAL/PLATELET
BASOS PCT: 1 %
Basophils Absolute: 0.1 10*3/uL (ref 0–0.1)
EOS ABS: 0.2 10*3/uL (ref 0–0.7)
Eosinophils Relative: 2 %
HCT: 41.5 % (ref 35.0–47.0)
Hemoglobin: 14.2 g/dL (ref 12.0–16.0)
Lymphocytes Relative: 44 %
Lymphs Abs: 3.9 10*3/uL — ABNORMAL HIGH (ref 1.0–3.6)
MCH: 32.2 pg (ref 26.0–34.0)
MCHC: 34.3 g/dL (ref 32.0–36.0)
MCV: 94 fL (ref 80.0–100.0)
MONO ABS: 0.9 10*3/uL (ref 0.2–0.9)
MONOS PCT: 10 %
Neutro Abs: 3.8 10*3/uL (ref 1.4–6.5)
Neutrophils Relative %: 43 %
Platelets: 314 10*3/uL (ref 150–440)
RBC: 4.41 MIL/uL (ref 3.80–5.20)
RDW: 13.7 % (ref 11.5–14.5)
WBC: 8.8 10*3/uL (ref 3.6–11.0)

## 2018-02-05 LAB — COMPREHENSIVE METABOLIC PANEL
ALBUMIN: 4 g/dL (ref 3.5–5.0)
ALK PHOS: 111 U/L (ref 38–126)
ALT: 18 U/L (ref 14–54)
AST: 20 U/L (ref 15–41)
Anion gap: 9 (ref 5–15)
BUN: 18 mg/dL (ref 6–20)
CALCIUM: 9.1 mg/dL (ref 8.9–10.3)
CO2: 24 mmol/L (ref 22–32)
CREATININE: 0.57 mg/dL (ref 0.44–1.00)
Chloride: 105 mmol/L (ref 101–111)
GFR calc Af Amer: >60 mL/min (ref >60)
GFR calc non Af Amer: >60 mL/min (ref >60)
GLUCOSE: 99 mg/dL (ref 65–99)
Potassium: 4 mmol/L (ref 3.5–5.1)
SODIUM: 138 mmol/L (ref 135–145)
Total Bilirubin: 0.5 mg/dL (ref 0.3–1.2)
Total Protein: 6.8 g/dL (ref 6.5–8.1)

## 2018-02-05 LAB — TSH: TSH: 0.18 u[IU]/mL — AB (ref 0.350–4.500)

## 2018-02-05 MED ORDER — IBRUTINIB 420 MG PO TABS: 1.0000 | ORAL_TABLET | Freq: Every day | ORAL | 4 refills | Status: DC

## 2018-02-05 NOTE — Assessment & Plan Note (Addendum)
#   Marginal Zone lymphoma-recurrent most recently treated in March/April 2017 with Rituxan x4.   # FEB 2019- PET scan- stable ax/abdomen/pelvic LN ~2cm stable; suv-overall improved/stable.  However patient complains of extreme fatigue /and also episodes of frequent sweats-etiology unclear question underlying lymphoma [less likely given improved/stable PET scan] versus others [see discussion below].  If no other obvious etiology noted-treatment with ibrutinib would be recommended discussed the potential mechanism of action; potential side effects including but not limited to increased blood pressure; risk of A. Fib; increased bruising.  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  # back pain s/p injection-improvement noted.  #History of hypothyroidism-on Synthroid.  # Follow up in 4 weeks; labs; check TSH.  Addendum: TSH-check is low-at 0.18; question because of patient's fatigue/sweats.  Will check a complete thyroid profile ASAP; and would review the results prior to starting ibrutinib.   # I reviewed the blood work- with the patient in detail; also reviewed the imaging independently [as summarized above]; and with the patient in detail.   Cc; Dr.Hande

## 2018-02-05 NOTE — Progress Notes (Signed)
[place] OFFICE PROGRESS NOTE  Patient Care Team: Tracie Harrier, MD as PCP - General (Internal Medicine) Benjaman Kindler, MD as Consulting Physician (Obstetrics and Gynecology) Clent Jacks, RN as Registered Nurse  Cancer Staging No matching staging information was found for the patient.   Oncology History   # MAY 2015- Diagnosis of   low-grade B-cell lymphoma marginal zone.  From the left axillary lymph node biopsy.  Patient had a weekly rituximab therapy in May 2015 # April 2017-   Progressive disease with enlargement of lymphadenopathy on a subsequent CT scan;APril 2017- Rituxan weekly x4  #  Right adnexal mass and followed by Dr. Retia Passe for 2 years]; monitored.       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  female pleasant patient above history of marginal zone lymphoma recurrent status post most recently treated in March April 2017; and also history of right ovarian mass is here for follow-up/reviewed the results of the PET scan.  Patient complains of extreme fatigue and also complains of profuse sweats [both day and night] almost on a daily basis.  She denies any fevers.  Intermittent palpitations.  Appetite is good.  Patient has lost weight-however states that this is intentional.  Denies any new lumps or bumps. No chest pain or shortness of breath or cough.  REVIEW OF SYSTEMS:  A complete 10 point review of system is done which is negative except mentioned above/history of present illness.   PAST MEDICAL HISTORY :  Past Medical History:  Diagnosis Date  . Anxiety   . GERD (gastroesophageal reflux disease)   . History of appendectomy    30 plus yrs ago  . Hypertension   . Hypothyroidism   . Kidney stones   . Non Hodgkin's lymphoma ([place]) 2015  . S/P  partial hysterectomy   . Thyroid disease    hypothyroidism    PAST SURGICAL HISTORY :   Past Surgical History:  Procedure Laterality Date  . ABDOMINAL HYSTERECTOMY    . APPENDECTOMY    . AXILLARY LYMPH NODE BIOPSY Left 2015  . BREAST BIOPSY Right 07/15/1995   stereotactic biopsy at [facility]  . CARPAL TUNNEL RELEASE    . CERVICAL SPINE SURGERY    . COLONOSCOPY WITH PROPOFOL N/A 12/20/2015   Procedure: COLONOSCOPY WITH PROPOFOL;  Surgeon: Hulen Luster, MD;  Location: [facility] ENDOSCOPY;  Service: Gastroenterology;  Laterality: N/A;  . ESOPHAGOGASTRODUODENOSCOPY (EGD) WITH PROPOFOL N/A 12/20/2015   Procedure: ESOPHAGOGASTRODUODENOSCOPY (EGD) WITH PROPOFOL;  Surgeon: Hulen Luster, MD;  Location: [facility] ENDOSCOPY;  Service: Gastroenterology;  Laterality: N/A;  . JOINT REPLACEMENT Left 01/2015   knee  . TONSILLECTOMY      FAMILY HISTORY :   Family History  Problem Relation Age of Onset  . Cancer Other        breast cancer, colon cancer, lymphoma, ovarian cancer  . Diabetes Other   . Anemia Other   . CAD Other   . Liver disease Other   . Breast cancer Daughter 39  . Lymphoma Mother   . Pancreatic cancer Father   . Liver cancer Father     SOCIAL HISTORY:   Social History   Tobacco Use  . Smoking status: Former Smoker    Packs/day: 1.00    Years:  20.00    Pack years: 20.00    Last attempt to quit: 01/27/1994    Years since quitting: 24.0  . Smokeless tobacco: Never Used  Substance Use Topics  . Alcohol use: No    Alcohol/week: 0.0 oz  . Drug use: No    ALLERGIES:  is allergic to metoclopramide; percocet [oxycodone-acetaminophen]; and sulfa antibiotics.  MEDICATIONS:  Current Outpatient Medications  Medication Sig Dispense Refill  . ALPRAZolam (XANAX) 0.25 MG tablet Take 0.25 mg by mouth at bedtime as needed for anxiety.    Marland Kitchen amitriptyline (ELAVIL) 25 MG tablet Take 25 mg by mouth as needed (for head aches).     Marland Kitchen aspirin 81 MG tablet Take 81 mg by mouth every morning. Reported on  02/06/2016    . diclofenac (VOLTAREN) 75 MG EC tablet Take 1 tablet (75 mg total) by mouth 2 (two) times daily. 30 tablet 0  . fluticasone (FLONASE) 50 MCG/ACT nasal spray Place 1 spray into both nostrils daily.     Marland Kitchen gabapentin (NEURONTIN) 100 MG capsule Take 1 capsule by mouth 2 (two) times daily.    . hydrocortisone 2.5 % cream APPLY TOPICALLY 2 (TWO) TIMES DAILY. USE ON AFFECTED AREA X 10 DAYS  2  . HYDROmorphone (DILAUDID) 4 MG tablet Take 1 tablet (4 mg total) by mouth every 4 (four) hours as needed for severe pain. 30 tablet 0  . levothyroxine (SYNTHROID, LEVOTHROID) 112 MCG tablet Take 112 mcg by mouth daily before breakfast.    . meclizine (ANTIVERT) 25 MG tablet Take 25 mg by mouth as needed.    Marland Kitchen omeprazole (PRILOSEC) 20 MG capsule Take 20 mg by mouth every morning.     . Ibrutinib 420 MG TABS Take 1 tablet by mouth daily. 30 tablet 4  . promethazine (PHENERGAN) 25 MG tablet Take 1 tablet (25 mg total) by mouth every 8 (eight) hours as needed. (Patient not taking: Reported on 02/05/2018) 20 tablet 0   No current facility-administered medications for this visit.     PHYSICAL EXAMINATION: ECOG PERFORMANCE STATUS: 0 - Asymptomatic  BP (!) 157/83 (BP Location: Left Arm, Patient Position: Sitting)   Pulse 82   Temp 97.9 F (36.6 C) (Tympanic)   Resp 16   Wt 189 lb (85.7 kg)   BMI 32.44 kg/m   Filed Weights   02/05/18 1446  Weight: 189 lb (85.7 kg)    GENERAL: Well-nourished well-developed; Alert, no distress and comfortable.   Obese. She is alone. EYES: no pallor or icterus OROPHARYNX: no thrush or ulceration; good dentition  NECK: supple, no masses felt LYMPH:  no palpable lymphadenopathy in the cervical, axillary. Approximately 1 cm sized lymph nodes felt in the left inguinal region. LUNGS: clear to auscultation and  No wheeze or crackles HEART/CVS: regular rate & rhythm and no murmurs; No lower extremity edema ABDOMEN:abdomen soft, non-tender and normal bowel  sounds Musculoskeletal:no cyanosis of digits and no clubbing  PSYCH: alert & oriented x 3 with fluent speech NEURO: no focal motor/sensory deficits SKIN:  no rashes or significant lesions  LABORATORY DATA:  I have reviewed the data as listed    Component Value Date/Time   NA 138 02/05/2018 1353   NA 139 01/08/2015 1058   K 4.0 02/05/2018 1353   K 4.3 01/08/2015 1058   CL 105 02/05/2018 1353   CL 107 01/08/2015 1058   CO2 24 02/05/2018 1353   CO2 26 01/08/2015 1058   GLUCOSE 99 02/05/2018 1353   GLUCOSE 96 01/08/2015  1058   BUN 18 02/05/2018 1353   BUN 11 01/08/2015 1058   CREATININE 0.57 02/05/2018 1353   CREATININE 0.63 01/08/2015 1058   CALCIUM 9.1 02/05/2018 1353   CALCIUM 9.3 01/08/2015 1058   PROT 6.8 02/05/2018 1353   PROT 7.0 07/11/2014 1010   ALBUMIN 4.0 02/05/2018 1353   ALBUMIN 3.6 07/11/2014 1010   AST 20 02/05/2018 1353   AST 23 07/11/2014 1010   ALT 18 02/05/2018 1353   ALT 25 07/11/2014 1010   ALKPHOS 111 02/05/2018 1353   ALKPHOS 84 07/11/2014 1010   BILITOT 0.5 02/05/2018 1353   BILITOT 0.4 07/11/2014 1010   GFRNONAA >60 02/05/2018 1353   GFRNONAA >60 01/08/2015 1058   GFRNONAA >60 07/11/2014 1010   GFRAA >60 02/05/2018 1353   GFRAA >60 01/08/2015 1058   GFRAA >60 07/11/2014 1010    No results found for: SPEP, UPEP  Lab Results  Component Value Date   WBC 8.8 02/05/2018   NEUTROABS 3.8 02/05/2018   HGB 14.2 02/05/2018   HCT 41.5 02/05/2018   MCV 94.0 02/05/2018   PLT 314 02/05/2018      Chemistry      Component Value Date/Time   NA 138 02/05/2018 1353   NA 139 01/08/2015 1058   K 4.0 02/05/2018 1353   K 4.3 01/08/2015 1058   CL 105 02/05/2018 1353   CL 107 01/08/2015 1058   CO2 24 02/05/2018 1353   CO2 26 01/08/2015 1058   BUN 18 02/05/2018 1353   BUN 11 01/08/2015 1058   CREATININE 0.57 02/05/2018 1353   CREATININE 0.63 01/08/2015 1058      Component Value Date/Time   CALCIUM 9.1 02/05/2018 1353   CALCIUM 9.3 01/08/2015 1058    ALKPHOS 111 02/05/2018 1353   ALKPHOS 84 07/11/2014 1010   AST 20 02/05/2018 1353   AST 23 07/11/2014 1010   ALT 18 02/05/2018 1353   ALT 25 07/11/2014 1010   BILITOT 0.5 02/05/2018 1353   BILITOT 0.4 07/11/2014 1010      IMPRESSION: 1. Partial metabolic response. Mildly hypermetabolic left external iliac lymphadenopathy is decreased in metabolism. No residual hypermetabolism within the mildly enlarged bilateral axillary nodes. No newly hypermetabolic lymph nodes. 2. 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 3. Chronic findings include: Aortic Atherosclerosis (ICD10-I70.0). Coronary atherosclerosis. Paranasal sinusitis, possibly acute. Stable left adrenal adenoma. Moderate colonic diverticulosis.   Electronically Signed   By: Ilona Sorrel M.D.   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   # FEB 2019- PET scan- stable ax/abdomen/pelvic LN ~2cm stable; suv-overall improved/stable.  However patient complains of extreme fatigue /and also episodes of frequent sweats-etiology unclear question underlying lymphoma [less likely given improved/stable PET scan] versus others [see discussion below].  If no other obvious etiology noted-treatment with ibrutinib would be recommended discussed the potential mechanism of action; potential side effects including but not limited to increased blood pressure; risk of A. Fib; increased bruising.  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  # back pain s/p injection-improvement noted.  #History of hypothyroidism-on Synthroid.  # Follow up in 4 weeks; labs; check TSH.  Addendum: TSH-check is low-at 0.18; question because of patient's  fatigue/sweats.  Will check a complete thyroid  profile ASAP; and would review the results prior to starting ibrutinib.   # I reviewed the blood work- with the patient in detail; also reviewed the imaging independently [as summarized above]; and with the patient in detail.   Cc; Dr.Hande  Orders Placed This Encounter  Procedures  . TSH    Standing Status:   Future    Number of Occurrences:   1    Standing Expiration Date:   02/06/2019  . CBC with Differential/Platelet    Standing Status:   Future    Standing Expiration Date:   02/06/2019  . Comprehensive metabolic panel    Standing Status:   Future    Standing Expiration Date:   02/06/2019   All questions were answered. The patient knows to call the clinic with any problems, questions or concerns. Reviewed the recent labs from her PCP's office. Normal limits.     Cammie Sickle, MD 02/06/2018 10:17 PM

## 2018-02-06 ENCOUNTER — Telehealth: Payer: Self-pay | Admitting: Internal Medicine

## 2018-02-06 NOTE — Telephone Encounter (Signed)
Heather/Brooke- please inform the patient's TSH-check is low [which could potentially explain patient's fatigue/sweats].  Recommend checking thyroid profile [please order ASAP].   #We will plan to hold starting ibrutinib until above workup is available.  Will inform patient if she needs to have a readjustment of her thyroid medication based on repeat labs.

## 2018-02-08 ENCOUNTER — Telehealth: Payer: Self-pay | Admitting: Internal Medicine

## 2018-02-08 ENCOUNTER — Telehealth: Payer: Self-pay | Admitting: Pharmacist

## 2018-02-08 NOTE — Telephone Encounter (Signed)
I left message for patient to return phone call.   

## 2018-02-08 NOTE — Telephone Encounter (Addendum)
Oral Oncology Patient Advocate Encounter  Met patient in Lobby to complete application for [employer] in an effort to reduce patient's out of pocket expense for Imbruvica to  $0.    Patients co-pay is $5,427.06  Application completed and faxed to [phone].   Patient assistance phone number for follow up is [phone].   This encounter will be updated until final determination.   [facility] Patient Advocate [phone] 02/08/2018 8:54 AM

## 2018-02-08 NOTE — Telephone Encounter (Signed)
Oral Oncology Pharmacist Encounter  Received new prescription for Imbruvica (ibrutinib) for the treatment of Marginal Zone Lymphoma, planned duration until disease progression or unacceptable drug toxicity. She was previously treated with rituximab.   The plan is for the patient to get started on Ibruitnib after her thyroid profile is evaluated.  CBC and LFTs from 02/05/18 assessed, no relevant lab abnormalities. Prescription dose and frequency assessed. Spoke with Dr. Rogue Bussing and the patient is intentional starting at a lower dose than the usually dose for marginal zone lymphoma.   Current medication list in Epic reviewed, a few DDIs with ibrutinib identified: - Ibrutinib may increase the antiplatelet effects of aspirin and diclofenac. No dosing adjustment needed at this time. Ms. Lentz platelets should be monitored once the ibrutinib is started.   Oral Oncology Clinic will continue to follow for insurance authorization, copayment issues, initial counseling and start date.  Darl Pikes, PharmD, BCPS Hematology/Oncology Clinical Pharmacist [facility] [phone]  02/08/2018 11:24 AM

## 2018-02-09 ENCOUNTER — Telehealth: Payer: Self-pay | Admitting: Internal Medicine

## 2018-02-09 NOTE — Telephone Encounter (Signed)
reviwed the thyroid results; left message for pt to call us back; pt should NOT start ibrutinib- until re-eval with thyroid profile.   She was asked to call us back.

## 2018-02-10 ENCOUNTER — Telehealth: Payer: Self-pay | Admitting: *Deleted

## 2018-02-10 ENCOUNTER — Telehealth: Payer: Self-pay | Admitting: Internal Medicine

## 2018-02-10 DIAGNOSIS — C8588 Other specified types of non-Hodgkin lymphoma, lymph nodes of multiple sites: Secondary | ICD-10-CM

## 2018-02-10 DIAGNOSIS — R7989 Other specified abnormal findings of blood chemistry: Secondary | ICD-10-CM

## 2018-02-10 NOTE — Addendum Note (Signed)
Addended by: Sabino Gasser on: 02/10/2018 04:40 PM   Modules accepted: Orders

## 2018-02-10 NOTE — Telephone Encounter (Signed)
Patient here in the office to discuss results with Dr. Rogue Bussing.

## 2018-02-10 NOTE — Telephone Encounter (Signed)
#   Patient symptomatic with hot flashes/sweats and fatigue/heat intolerance -question lymphoma [clinically less likely as recent PET scan stable]; however patient's TSH was low.  Discussed that I would recommend thyroid profile however patient wants to have withdrawn with PCP; [labs ordered for next week].  I have tried to reach out to Dr. Linton Ham office; unable to reach. HOLD ibrutinib at this time.   # H/B- please reach out to Dr.Hande's office re: above.   #Patient follow-up with me in 2 months/-please order thyroid profile at the time

## 2018-02-10 NOTE — Telephone Encounter (Signed)
I routed this note to Dr. Linton Ham office

## 2018-02-10 NOTE — Telephone Encounter (Signed)
-----   Message from Wilburn Cornelia sent at 02/10/2018  8:56 AM EST ----- Regarding: lab callback Contact: [phone] Pt left VM that Dr B had called her about lab results.

## 2018-02-15 ENCOUNTER — Telehealth: Payer: Self-pay | Admitting: Internal Medicine

## 2018-02-15 NOTE — Telephone Encounter (Signed)
Oral Oncology Patient Advocate Encounter  Called [employer] ([place]) To have them hold off shipping patients Imbruvica for now, per Dr. Rogue Bussing. Will call them when we are ready for shipment.  Spoke with Ellie Lunch. She will put on hold.   [facility] Patient Advocate [phone] 02/15/2018 9:13 AM

## 2018-02-15 NOTE — Telephone Encounter (Signed)
Oral Oncology Patient Advocate Encounter  Received notification from [facility] Patient Assistance program that patient has been successfully enrolled into their program to receive Imbruvica from the manufacturer at $0 out of pocket until 12/07/2018.   We are holding her treatment for now. Will need to get patient to sign Part D coverage form by 04/12/2018. If we start her on medication.   Patient knows to call the office with questions or concerns.  Oral Oncology Clinic will continue to follow.  [place] Patient Advocate [phone] 02/15/2018 11:30 AM

## 2018-02-22 ENCOUNTER — Ambulatory Visit (INDEPENDENT_AMBULATORY_CARE_PROVIDER_SITE_OTHER): Payer: Medicare Other

## 2018-02-22 ENCOUNTER — Encounter: Payer: Self-pay | Admitting: Podiatry

## 2018-02-22 ENCOUNTER — Ambulatory Visit (INDEPENDENT_AMBULATORY_CARE_PROVIDER_SITE_OTHER): Payer: Medicare Other | Admitting: Podiatry

## 2018-02-22 DIAGNOSIS — M722 Plantar fascial fibromatosis: Secondary | ICD-10-CM

## 2018-02-22 DIAGNOSIS — M7662 Achilles tendinitis, left leg: Secondary | ICD-10-CM

## 2018-02-22 DIAGNOSIS — M7661 Achilles tendinitis, right leg: Secondary | ICD-10-CM | POA: Diagnosis not present

## 2018-02-22 MED ORDER — METHYLPREDNISOLONE 4 MG PO TBPK: ORAL_TABLET | ORAL | 0 refills | Status: DC

## 2018-02-22 NOTE — Progress Notes (Signed)
She presents today for follow-up endoscopic plantar fasciotomy and Achilles tendon lysis with gastroc recession calcaneal exostectomy on the left foot.  Date of surgery December 04, 2017.  She states that the right heel is starting to bother me and it has now started to cause me to develop pain in the left heel.  This been going on now for about a month or so.  Objective: Vital signs are stable alert oriented x3 pulses are palpable.  Neurologic sensorium is intact.  Degenerative flexors are intact muscle strength is normal symmetrical bilateral.  There is no tenderness on palpation of the calf in either leg.  She has negative Thompson's test.  She has tenderness on palpation of the Achilles distal at its insertion site right.  Left foot is mildly tender at the surgical sites.  Radiographs taken today demonstrate well healing surgical foot left with mild edema in the heel area.  She has no fractures identified left.  Right foot demonstrates a retrocalcaneal heel spur thickening of the Achilles tendon soft tissue increase in density at its insertion site.  No fractures are identified.  Assessment: Achilles tendinitis insertional nature right.  Some tendinitis associated with compensation left foot.  Plan: Start her on a Medrol Dosepak.  Also injected dexamethasone and local anesthetic after sterile Betadine skin prep subcutaneous point of maximal tenderness retrocalcaneal area.  This was not injected into the tendon.  I will follow-up with her in 2-4 weeks.  Instructed her that she may well wear the cam walker on the right foot.

## 2018-03-03 ENCOUNTER — Encounter: Payer: Medicare Other | Admitting: Podiatry

## 2018-03-05 ENCOUNTER — Other Ambulatory Visit: Payer: Medicare Other

## 2018-03-05 ENCOUNTER — Ambulatory Visit: Payer: Medicare Other | Admitting: Internal Medicine

## 2018-03-09 ENCOUNTER — Telehealth: Payer: Self-pay | Admitting: Internal Medicine

## 2018-03-09 NOTE — Telephone Encounter (Signed)
Oral Oncology Patient Advocate Encounter  Received a phone call from [facility] asking if we were still holding off on patients Imbruvica. I told them yes. We will call when we are going to start her. She said she would put that in patients notes.   [place] Patient Advocate [phone] 03/09/2018 11:18 AM

## 2018-03-22 ENCOUNTER — Encounter: Payer: Self-pay | Admitting: Podiatry

## 2018-03-22 ENCOUNTER — Ambulatory Visit (INDEPENDENT_AMBULATORY_CARE_PROVIDER_SITE_OTHER): Payer: Medicare Other | Admitting: Podiatry

## 2018-03-22 DIAGNOSIS — G588 Other specified mononeuropathies: Secondary | ICD-10-CM | POA: Diagnosis not present

## 2018-03-22 NOTE — Progress Notes (Signed)
She presents today for follow-up of bilateral tendinitis.  States that the tendinitis is still sore physical therapy seems to be helping but I am really at problems with the pain right in here as she refers to the neuromas third interdigital space bilaterally.  Objective: Vital signs are stable alert and oriented x3.  Decrease in edema from the bilateral posterior heels though there is still warm to the touch.  Tendon margins appear to be doing much better on the left foot and she has good dorsiflexion range of motion as compared to the right foot.  She has pain on palpation third interdigital space bilateral with a palpable Mulder click.  Assessment: Neuroma third interdigital space bilateral.  Well-healing Achilles and retrocalcaneal repair left active Achilles tendinitis right.  Plan: At this point after sterile Betadine skin prep 20 mg of Kenalog was injected with 5 mg Marcaine to the third interdigital space bilateral.  Tolerated procedure well without complications.  I will follow-up with her in the near future to reevaluate posterior heels as well as the neuromas.

## 2018-03-31 ENCOUNTER — Other Ambulatory Visit: Payer: Self-pay | Admitting: Internal Medicine

## 2018-03-31 DIAGNOSIS — Z1231 Encounter for screening mammogram for malignant neoplasm of breast: Secondary | ICD-10-CM

## 2018-04-19 ENCOUNTER — Ambulatory Visit (INDEPENDENT_AMBULATORY_CARE_PROVIDER_SITE_OTHER): Payer: Medicare Other | Admitting: Podiatry

## 2018-04-19 ENCOUNTER — Encounter: Payer: Self-pay | Admitting: Podiatry

## 2018-04-19 DIAGNOSIS — M7661 Achilles tendinitis, right leg: Secondary | ICD-10-CM

## 2018-04-19 NOTE — Progress Notes (Signed)
She presents today for follow-up of her chronic Achilles pain status post Achilles tendon repair left.  She is also following up for her neuroma.  She states that she continues physical therapy though she has not been for the past 2 weeks because her therapist has been on vacation.  She states that it seems to be helping a lot as long as she has the appropriate therapist.  Objective: Vital signs are stable alert and oriented x3.  Pulses are palpable.  Patient is ambulating without antalgia is doing very well.  Still has some warm areas to the posterior aspect of the Achilles left.  Mild edema to the left ankle but all in all seems to be doing much better.  States that her knee has improved as well.  She has no pain on palpation of the neuromas bilateral.  Assessment: Well-healing surgical foot bilateral.  Continue physical therapy follow-up with her in 2 months.

## 2018-05-04 ENCOUNTER — Ambulatory Visit
Admission: RE | Admit: 2018-05-04 | Discharge: 2018-05-04 | Disposition: A | Discharge status: Home / Self Care | Payer: Medicare Other | Source: Ambulatory Visit | Attending: Internal Medicine | Admitting: Internal Medicine

## 2018-05-04 DIAGNOSIS — Z1231 Encounter for screening mammogram for malignant neoplasm of breast: Secondary | ICD-10-CM | POA: Diagnosis present

## 2018-05-04 HISTORY — DX: Personal history of antineoplastic chemotherapy: Z92.21

## 2018-05-07 ENCOUNTER — Inpatient Hospital Stay (HOSPITAL_BASED_OUTPATIENT_CLINIC_OR_DEPARTMENT_OTHER): Payer: Medicare Other | Admitting: Internal Medicine

## 2018-05-07 ENCOUNTER — Other Ambulatory Visit: Payer: Self-pay

## 2018-05-07 ENCOUNTER — Inpatient Hospital Stay: Payer: Medicare Other | Attending: Internal Medicine

## 2018-05-07 VITALS — BP 144/84 | HR 69 | Temp 98.1°F | Resp 16 | Wt 189.2 lb

## 2018-05-07 DIAGNOSIS — I2510 Atherosclerotic heart disease of native coronary artery without angina pectoris: Secondary | ICD-10-CM

## 2018-05-07 DIAGNOSIS — Z87442 Personal history of urinary calculi: Secondary | ICD-10-CM

## 2018-05-07 DIAGNOSIS — C8588 Other specified types of non-Hodgkin lymphoma, lymph nodes of multiple sites: Secondary | ICD-10-CM

## 2018-05-07 DIAGNOSIS — K219 Gastro-esophageal reflux disease without esophagitis: Secondary | ICD-10-CM | POA: Diagnosis not present

## 2018-05-07 DIAGNOSIS — Z800 Family history of malignant neoplasm of digestive organs: Secondary | ICD-10-CM | POA: Insufficient documentation

## 2018-05-07 DIAGNOSIS — E119 Type 2 diabetes mellitus without complications: Secondary | ICD-10-CM

## 2018-05-07 DIAGNOSIS — F419 Anxiety disorder, unspecified: Secondary | ICD-10-CM

## 2018-05-07 DIAGNOSIS — D649 Anemia, unspecified: Secondary | ICD-10-CM | POA: Diagnosis not present

## 2018-05-07 DIAGNOSIS — C8308 Small cell B-cell lymphoma, lymph nodes of multiple sites: Secondary | ICD-10-CM

## 2018-05-07 DIAGNOSIS — Z803 Family history of malignant neoplasm of breast: Secondary | ICD-10-CM | POA: Diagnosis not present

## 2018-05-07 DIAGNOSIS — E0580 Other thyrotoxicosis without thyrotoxic crisis or storm: Secondary | ICD-10-CM | POA: Diagnosis not present

## 2018-05-07 DIAGNOSIS — Z7982 Long term (current) use of aspirin: Secondary | ICD-10-CM | POA: Diagnosis not present

## 2018-05-07 DIAGNOSIS — N839 Noninflammatory disorder of ovary, fallopian tube and broad ligament, unspecified: Secondary | ICD-10-CM | POA: Diagnosis not present

## 2018-05-07 DIAGNOSIS — Z79899 Other long term (current) drug therapy: Secondary | ICD-10-CM | POA: Insufficient documentation

## 2018-05-07 DIAGNOSIS — E039 Hypothyroidism, unspecified: Secondary | ICD-10-CM | POA: Diagnosis not present

## 2018-05-07 DIAGNOSIS — M549 Dorsalgia, unspecified: Secondary | ICD-10-CM | POA: Diagnosis not present

## 2018-05-07 DIAGNOSIS — Z87891 Personal history of nicotine dependence: Secondary | ICD-10-CM | POA: Insufficient documentation

## 2018-05-07 DIAGNOSIS — R7989 Other specified abnormal findings of blood chemistry: Secondary | ICD-10-CM

## 2018-05-07 LAB — CBC WITH DIFFERENTIAL/PLATELET
BASOS ABS: 0 10*3/uL (ref 0–0.1)
BASOS PCT: 1 %
Eosinophils Absolute: 0.1 10*3/uL (ref 0–0.7)
Eosinophils Relative: 2 %
HEMATOCRIT: 43.1 % (ref 35.0–47.0)
Hemoglobin: 14.7 g/dL (ref 12.0–16.0)
Lymphocytes Relative: 50 %
Lymphs Abs: 2.9 10*3/uL (ref 1.0–3.6)
MCH: 32.5 pg (ref 26.0–34.0)
MCHC: 34.2 g/dL (ref 32.0–36.0)
MCV: 94.9 fL (ref 80.0–100.0)
MONO ABS: 0.7 10*3/uL (ref 0.2–0.9)
Monocytes Relative: 12 %
NEUTROS ABS: 2 10*3/uL (ref 1.4–6.5)
Neutrophils Relative %: 35 %
PLATELETS: 270 10*3/uL (ref 150–440)
RBC: 4.54 MIL/uL (ref 3.80–5.20)
RDW: 13.4 % (ref 11.5–14.5)
WBC: 5.8 10*3/uL (ref 3.6–11.0)

## 2018-05-07 LAB — COMPREHENSIVE METABOLIC PANEL
ALBUMIN: 4.2 g/dL (ref 3.5–5.0)
ALK PHOS: 100 U/L (ref 38–126)
ALT: 18 U/L (ref 14–54)
ANION GAP: 9 (ref 5–15)
AST: 24 U/L (ref 15–41)
BILIRUBIN TOTAL: 0.7 mg/dL (ref 0.3–1.2)
BUN: 13 mg/dL (ref 6–20)
CALCIUM: 9.7 mg/dL (ref 8.9–10.3)
CO2: 27 mmol/L (ref 22–32)
Chloride: 104 mmol/L (ref 101–111)
Creatinine, Ser: 0.66 mg/dL (ref 0.44–1.00)
Glucose, Bld: 103 mg/dL — ABNORMAL HIGH (ref 65–99)
Potassium: 4.8 mmol/L (ref 3.5–5.1)
Sodium: 140 mmol/L (ref 135–145)
TOTAL PROTEIN: 6.9 g/dL (ref 6.5–8.1)

## 2018-05-07 NOTE — Assessment & Plan Note (Addendum)
#   Marginal Zone lymphoma-stage iv; no clinical evidence of progression/stable.  Most recent PET scan February 2019-stable axillary abdominal pelvic lymph nodes.  Will again repeat imaging in November 2020. [Patient preference].  Continue surveillance  # right ovarian mass- declines any GYN oncology evaluation; suspect benign/related lymphoma.  # back/ joint pains- stable.   # itrarogenic hyperthrydios- synthroid- Dr.Hande managming..    # follow up in 6 months [aug [place].    Addendum: TSH 0.098 normal T3-T4.  Defer to patient's PCP regarding hypothyroidism management.

## 2018-05-07 NOTE — Progress Notes (Signed)
[place] OFFICE PROGRESS NOTE  Patient Care Team: Tracie Harrier, MD as PCP - General (Internal Medicine) Benjaman Kindler, MD as Consulting Physician (Obstetrics and Gynecology) Clent Jacks, RN as Registered Nurse  Cancer Staging No matching staging information was found for the patient.   Oncology History   # MAY 2015- Diagnosis of   low-grade B-cell lymphoma marginal zone.  From the left axillary lymph node biopsy.  Patient had a weekly rituximab therapy in May 2015 # April 2017-   Progressive disease with enlargement of lymphadenopathy on a subsequent CT scan;APril 2017- Rituxan weekly x4  #  Right adnexal mass and followed by Dr. Retia Passe for 2 years]; monitored.   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)      INTERVAL HISTORY:  Kaitlin Thompson 67 y.o.  female pleasant patient above history of marginal zone lymphoma is here for follow-up.  Patient had low TSH-secondary to iatrogenic hyperthyroidism.  Her Synthroid has been adjusted by her PCP.  Patient admits to chronic joint pains back pain which is not any worse.  Also complains of intermittent palpitations.  Intermittent hot flashes.  Otherwise not any worse.  No weight loss.  Review of Systems  Constitutional: Positive for malaise/fatigue. Negative for chills, diaphoresis, fever and weight loss.  HENT: Negative for nosebleeds and sore throat.   Eyes: Negative for double vision.  Respiratory: Negative for cough, hemoptysis, sputum production, shortness of breath and wheezing.   Cardiovascular: Positive for palpitations. Negative for chest pain, orthopnea and leg swelling.  Gastrointestinal: Negative for abdominal pain, blood in stool, constipation,  diarrhea, heartburn, melena, nausea and vomiting.  Genitourinary: Negative for dysuria, frequency and urgency.  Musculoskeletal: Positive for back pain and joint pain.  Skin: Negative.  Negative for itching and rash.  Neurological: Negative for dizziness, tingling, focal weakness, weakness and headaches.  Endo/Heme/Allergies: Does not bruise/bleed easily.  Psychiatric/Behavioral: Negative for depression. The patient is not nervous/anxious and does not have insomnia.       PAST MEDICAL HISTORY :  Past Medical History:  Diagnosis Date  . Anxiety   . GERD (gastroesophageal reflux disease)   . History of appendectomy    30 plus yrs ago  . Hypertension   . Hypothyroidism   . Kidney stones   . Non Hodgkin's lymphoma ([place]) 2015  . Personal history of chemotherapy   . S/P partial hysterectomy   . Thyroid disease    hypothyroidism    PAST SURGICAL HISTORY :   Past Surgical History:  Procedure Laterality Date  . ABDOMINAL HYSTERECTOMY    . APPENDECTOMY    . AXILLARY LYMPH NODE BIOPSY Left 2015  . BREAST BIOPSY Right 07/15/1995   stereotactic biopsy at [facility]- neg  . CARPAL TUNNEL RELEASE    . CERVICAL SPINE SURGERY    . COLONOSCOPY WITH PROPOFOL N/A 12/20/2015   Procedure: COLONOSCOPY WITH PROPOFOL;  Surgeon: Hulen Luster, MD;  [facility] END[facility]COPY;  Service: Gastroenterology;  Laterality: N/A;  . ESOPHAGOGASTRODUODENOSCOPY (EGD) WITH PROPOFOL N/A 12/20/2015   Procedure: ESOPHAGOGASTRODUODENOSCOPY (EGD) WITH PROPOFOL;  Surgeon: Hulen Luster, MD;  Location: [facility] ENDOSCOPY;  Service: Gastroenterology;  Laterality: N/A;  . JOINT REPLACEMENT Left 01/2015   knee  . TONSILLECTOMY  FAMILY HISTORY :   Family History  Problem Relation Age of Onset  . Cancer Other        breast cancer, colon cancer, lymphoma, ovarian cancer  . Diabetes Other   . Anemia Other   . CAD Other   . Liver disease Other   . Breast cancer Daughter 48  . Lymphoma Mother   . Pancreatic cancer Father   . Liver  cancer Father   . Breast cancer Daughter 77    SOCIAL HISTORY:   Social History   Tobacco Use  . Smoking status: Former Smoker    Packs/day: 1.00    Years: 20.00    Pack years: 20.00    Last attempt to quit: 01/27/1994    Years since quitting: 24.2  . Smokeless tobacco: Never Used  Substance Use Topics  . Alcohol use: No    Alcohol/week: 0.0 oz  . Drug use: No    ALLERGIES:  is allergic to metoclopramide; percocet [oxycodone-acetaminophen]; and sulfa antibiotics.  MEDICATIONS:  Current Outpatient Medications  Medication Sig Dispense Refill  . ALPRAZolam (XANAX) 0.25 MG tablet Take 0.25 mg by mouth at bedtime as needed for anxiety.    Marland Kitchen amitriptyline (ELAVIL) 25 MG tablet Take 25 mg by mouth as needed (for head aches).     Marland Kitchen aspirin 81 MG tablet Take 81 mg by mouth every morning. Reported on 02/06/2016    . diclofenac (VOLTAREN) 75 MG EC tablet Take 1 tablet (75 mg total) by mouth 2 (two) times daily. 30 tablet 0  . fluticasone (FLONASE) 50 MCG/ACT nasal spray Place 1 spray into both nostrils daily.     Marland Kitchen gabapentin (NEURONTIN) 100 MG capsule Take 1 capsule by mouth 2 (two) times daily.    . hydrocortisone 2.5 % cream APPLY TOPICALLY 2 (TWO) TIMES DAILY. USE ON AFFECTED AREA X 10 DAYS  2  . HYDROmorphone (DILAUDID) 4 MG tablet Take 1 tablet (4 mg total) by mouth every 4 (four) hours as needed for severe pain. 30 tablet 0  . levothyroxine (SYNTHROID, LEVOTHROID) 100 MCG tablet Take by mouth. Patient taking on Saturday and Sunday only    . levothyroxine (SYNTHROID, LEVOTHROID) 112 MCG tablet Take by mouth.    . meclizine (ANTIVERT) 25 MG tablet Take 25 mg by mouth as needed.    Marland Kitchen omeprazole (PRILOSEC) 20 MG capsule Take 20 mg by mouth every morning.      No current facility-administered medications for this visit.     PHYSICAL EXAMINATION: ECOG PERFORMANCE STATUS: 0 - Asymptomatic  BP (!) 144/84 (BP Location: Left Arm, Patient Position: Sitting)   Pulse 69   Temp 98.1 F  (36.7 C) (Tympanic)   Resp 16   Wt 189 lb 3.2 oz (85.8 kg)   BMI 32.48 kg/m   Filed Weights   05/07/18 1103  Weight: 189 lb 3.2 oz (85.8 kg)    GENERAL: Well-nourished well-developed; Alert, no distress and comfortable.  Alone.   EYES: no pallor or icterus OROPHARYNX: no thrush or ulceration; NECK: supple; no lymph nodes felt. LYMPH:  no palpable lymphadenopathy in the axillary or inguinal regions LUNGS: Decreased breath sounds auscultation bilaterally. No wheeze or crackles HEART/CVS: regular rate & rhythm and no murmurs; No lower extremity edema ABDOMEN:abdomen soft, non-tender and normal bowel sounds. No hepatomegaly or splenomegaly.  Musculoskeletal:no cyanosis of digits and no clubbing  PSYCH: alert & oriented x 3 with fluent speech NEURO: no focal motor/sensory deficits SKIN:  no rashes or significant lesions  LABORATORY DATA:  I have reviewed the data as listed    Component Value Date/Time   NA 140 05/07/2018 1037   NA 139 01/08/2015 1058   K 4.8 05/07/2018 1037   K 4.3 01/08/2015 1058   CL 104 05/07/2018 1037   CL 107 01/08/2015 1058   CO2 27 05/07/2018 1037   CO2 26 01/08/2015 1058   GLUCOSE 103 (H) 05/07/2018 1037   GLUCOSE 96 01/08/2015 1058   BUN 13 05/07/2018 1037   BUN 11 01/08/2015 1058   CREATININE 0.66 05/07/2018 1037   CREATININE 0.63 01/08/2015 1058   CALCIUM 9.7 05/07/2018 1037   CALCIUM 9.3 01/08/2015 1058   PROT 6.9 05/07/2018 1037   PROT 7.0 07/11/2014 1010   ALBUMIN 4.2 05/07/2018 1037   ALBUMIN 3.6 07/11/2014 1010   AST 24 05/07/2018 1037   AST 23 07/11/2014 1010   ALT 18 05/07/2018 1037   ALT 25 07/11/2014 1010   ALKPHOS 100 05/07/2018 1037   ALKPHOS 84 07/11/2014 1010   BILITOT 0.7 05/07/2018 1037   BILITOT 0.4 07/11/2014 1010   GFRNONAA >60 05/07/2018 1037   GFRNONAA >60 01/08/2015 1058   GFRNONAA >60 07/11/2014 1010   GFRAA >60 05/07/2018 1037   GFRAA >60 01/08/2015 1058   GFRAA >60 07/11/2014 1010    No results found  for: SPEP, UPEP  Lab Results  Component Value Date   WBC 5.8 05/07/2018   NEUTROABS 2.0 05/07/2018   HGB 14.7 05/07/2018   HCT 43.1 05/07/2018   MCV 94.9 05/07/2018   PLT 270 05/07/2018      Chemistry      Component Value Date/Time   NA 140 05/07/2018 1037   NA 139 01/08/2015 1058   K 4.8 05/07/2018 1037   K 4.3 01/08/2015 1058   CL 104 05/07/2018 1037   CL 107 01/08/2015 1058   CO2 27 05/07/2018 1037   CO2 26 01/08/2015 1058   BUN 13 05/07/2018 1037   BUN 11 01/08/2015 1058   CREATININE 0.66 05/07/2018 1037   CREATININE 0.63 01/08/2015 1058      Component Value Date/Time   CALCIUM 9.7 05/07/2018 1037   CALCIUM 9.3 01/08/2015 1058   ALKPHOS 100 05/07/2018 1037   ALKPHOS 84 07/11/2014 1010   AST 24 05/07/2018 1037   AST 23 07/11/2014 1010   ALT 18 05/07/2018 1037   ALT 25 07/11/2014 1010   BILITOT 0.7 05/07/2018 1037   BILITOT 0.4 07/11/2014 1010       RADIOGRAPHIC STUDIES: I have personally reviewed the radiological images as listed and agreed with the findings in the report. No results found.   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  Most recent PET scan February 2019-stable axillary abdominal pelvic lymph nodes.  Will again repeat imaging in November 2020. [Patient preference].  Continue surveillance  # right ovarian mass- declines any GYN oncology evaluation; suspect benign/related lymphoma.  # back/ joint pains- stable.   # itrarogenic hyperthrydios- synthroid- Dr.Hande managming..    # follow up in 6 months [aug [place].    Addendum: TSH 0.098 normal T3-T4.  Defer to patient's PCP regarding hypothyroidism management.    Orders Placed This Encounter  Procedures  . CBC with Differential/Platelet    Standing Status:   Future    Standing Expiration Date:   05/08/2019  . Comprehensive metabolic panel    Standing Status:  Future    Standing Expiration Date:   05/08/2019  . Lactate dehydrogenase    Standing Status:   Future    Standing Expiration Date:   05/08/2019   All questions were answered. The patient knows to call the clinic with any problems, questions or concerns.      Cammie Sickle, MD 05/09/2018 11:55 AM

## 2018-05-08 LAB — THYROID PANEL WITH TSH
FREE THYROXINE INDEX: 3.3 (ref 1.2–4.9)
T3 UPTAKE RATIO: 28 % (ref 24–39)
T4, Total: 11.8 ug/dL (ref 4.5–12.0)
TSH: 0.097 u[IU]/mL — ABNORMAL LOW (ref 0.450–4.500)

## 2018-05-13 ENCOUNTER — Telehealth: Payer: Self-pay | Admitting: *Deleted

## 2018-05-13 NOTE — Telephone Encounter (Signed)
Spoke with patient - results of tsh reviewed. She states that she will contact her pcp for further mgmt. Results routed to Dr. Ginette Pitman.

## 2018-05-13 NOTE — Telephone Encounter (Signed)
-----   Message from Cammie Sickle, MD sent at 05/10/2018  9:55 PM EDT ----- Nira Conn- Please inform patient that her TSH is still abnormal.  Please have patient reach out to her PCP; Dr. Ginette Pitman for further titration of her Synthroid.  Follow-up with [place] as planned.

## 2018-06-23 ENCOUNTER — Ambulatory Visit: Payer: No Typology Code available for payment source | Admitting: Podiatry

## 2018-09-03 ENCOUNTER — Telehealth: Payer: Self-pay | Admitting: *Deleted

## 2018-09-03 DIAGNOSIS — C8588 Other specified types of non-Hodgkin lymphoma, lymph nodes of multiple sites: Secondary | ICD-10-CM

## 2018-09-03 NOTE — Telephone Encounter (Addendum)
Colette, please add patient to Dr. Sharmaine Base schedule on Monday - per md   Also add a lab encounter.

## 2018-09-03 NOTE — Telephone Encounter (Addendum)
Patient called asking if she can be seen sooner than November as she is having fatigue and night sweats. Please advise if she needs to come in for Symptom Management [facility] or do you want her scheduled to see you.

## 2018-09-06 ENCOUNTER — Inpatient Hospital Stay (HOSPITAL_BASED_OUTPATIENT_CLINIC_OR_DEPARTMENT_OTHER): Payer: Medicare Other | Admitting: Internal Medicine

## 2018-09-06 ENCOUNTER — Inpatient Hospital Stay: Payer: Medicare Other | Attending: Internal Medicine

## 2018-09-06 VITALS — BP 143/85 | HR 69 | Temp 97.8°F | Resp 16 | Wt 193.0 lb

## 2018-09-06 DIAGNOSIS — F419 Anxiety disorder, unspecified: Secondary | ICD-10-CM | POA: Diagnosis not present

## 2018-09-06 DIAGNOSIS — Z87442 Personal history of urinary calculi: Secondary | ICD-10-CM

## 2018-09-06 DIAGNOSIS — R5383 Other fatigue: Secondary | ICD-10-CM | POA: Insufficient documentation

## 2018-09-06 DIAGNOSIS — R42 Dizziness and giddiness: Secondary | ICD-10-CM | POA: Insufficient documentation

## 2018-09-06 DIAGNOSIS — M549 Dorsalgia, unspecified: Secondary | ICD-10-CM | POA: Diagnosis not present

## 2018-09-06 DIAGNOSIS — Z8041 Family history of malignant neoplasm of ovary: Secondary | ICD-10-CM | POA: Insufficient documentation

## 2018-09-06 DIAGNOSIS — Z9221 Personal history of antineoplastic chemotherapy: Secondary | ICD-10-CM | POA: Insufficient documentation

## 2018-09-06 DIAGNOSIS — R232 Flushing: Secondary | ICD-10-CM | POA: Diagnosis not present

## 2018-09-06 DIAGNOSIS — R531 Weakness: Secondary | ICD-10-CM | POA: Diagnosis not present

## 2018-09-06 DIAGNOSIS — Z7982 Long term (current) use of aspirin: Secondary | ICD-10-CM | POA: Diagnosis not present

## 2018-09-06 DIAGNOSIS — Z803 Family history of malignant neoplasm of breast: Secondary | ICD-10-CM

## 2018-09-06 DIAGNOSIS — E039 Hypothyroidism, unspecified: Secondary | ICD-10-CM

## 2018-09-06 DIAGNOSIS — K219 Gastro-esophageal reflux disease without esophagitis: Secondary | ICD-10-CM | POA: Diagnosis not present

## 2018-09-06 DIAGNOSIS — N838 Other noninflammatory disorders of ovary, fallopian tube and broad ligament: Secondary | ICD-10-CM | POA: Insufficient documentation

## 2018-09-06 DIAGNOSIS — Z806 Family history of leukemia: Secondary | ICD-10-CM

## 2018-09-06 DIAGNOSIS — Z79899 Other long term (current) drug therapy: Secondary | ICD-10-CM

## 2018-09-06 DIAGNOSIS — I10 Essential (primary) hypertension: Secondary | ICD-10-CM

## 2018-09-06 DIAGNOSIS — Z800 Family history of malignant neoplasm of digestive organs: Secondary | ICD-10-CM

## 2018-09-06 DIAGNOSIS — C8588 Other specified types of non-Hodgkin lymphoma, lymph nodes of multiple sites: Secondary | ICD-10-CM

## 2018-09-06 DIAGNOSIS — Z87891 Personal history of nicotine dependence: Secondary | ICD-10-CM | POA: Insufficient documentation

## 2018-09-06 LAB — CBC WITH DIFFERENTIAL/PLATELET
BASOS PCT: 1 %
Basophils Absolute: 0.1 10*3/uL (ref 0–0.1)
EOS ABS: 0.2 10*3/uL (ref 0–0.7)
Eosinophils Relative: 3 %
HEMATOCRIT: 43.8 % (ref 35.0–47.0)
HEMOGLOBIN: 14.9 g/dL (ref 12.0–16.0)
LYMPHS ABS: 2.9 10*3/uL (ref 1.0–3.6)
Lymphocytes Relative: 46 %
MCH: 32.3 pg (ref 26.0–34.0)
MCHC: 34.1 g/dL (ref 32.0–36.0)
MCV: 94.9 fL (ref 80.0–100.0)
MONO ABS: 0.7 10*3/uL (ref 0.2–0.9)
MONOS PCT: 12 %
NEUTROS PCT: 38 %
Neutro Abs: 2.4 10*3/uL (ref 1.4–6.5)
Platelets: 263 10*3/uL (ref 150–440)
RBC: 4.61 MIL/uL (ref 3.80–5.20)
RDW: 14.3 % (ref 11.5–14.5)
WBC: 6.2 10*3/uL (ref 3.6–11.0)

## 2018-09-06 LAB — COMPREHENSIVE METABOLIC PANEL
ALBUMIN: 4.3 g/dL (ref 3.5–5.0)
ALT: 17 U/L (ref 0–44)
ANION GAP: 7 (ref 5–15)
AST: 20 U/L (ref 15–41)
Alkaline Phosphatase: 91 U/L (ref 38–126)
BILIRUBIN TOTAL: 0.8 mg/dL (ref 0.3–1.2)
BUN: 20 mg/dL (ref 8–23)
CO2: 28 mmol/L (ref 22–32)
Calcium: 9.7 mg/dL (ref 8.9–10.3)
Chloride: 105 mmol/L (ref 98–111)
Creatinine, Ser: 0.56 mg/dL (ref 0.44–1.00)
GFR calc Af Amer: >60 mL/min (ref >60)
Glucose, Bld: 117 mg/dL — ABNORMAL HIGH (ref 70–99)
POTASSIUM: 4.7 mmol/L (ref 3.5–5.1)
Sodium: 140 mmol/L (ref 135–145)
TOTAL PROTEIN: 7 g/dL (ref 6.5–8.1)

## 2018-09-06 LAB — LACTATE DEHYDROGENASE: LDH: 166 U/L (ref 98–192)

## 2018-09-06 NOTE — Progress Notes (Signed)
[place] OFFICE PROGRESS NOTE  Patient Care Team: Tracie Harrier, MD as PCP - General (Internal Medicine) Benjaman Kindler, MD as Consulting Physician (Obstetrics and Gynecology) Clent Jacks, RN as Registered Nurse  Cancer Staging No matching staging information was found for the patient.   Oncology History   # MAY 2015- Diagnosis of   low-grade B-cell lymphoma marginal zone.  From the left axillary lymph node biopsy.  Patient had a weekly rituximab therapy in May 2015 # April 2017-   Progressive disease with enlargement of lymphadenopathy on a subsequent CT scan;APril 2017- Rituxan weekly x4  #  Right adnexal mass and followed by Dr. Retia Passe for 2 years]; monitored.   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  female pleasant patient above history of marginal zone lymphoma is here for follow-up.  Patient currently on surveillance.  Patient continues to complain of worsening back pain; radiated to the front/abdomen pain progressive getting worse.  She also continues to have chronic dizziness getting worse recently especially with movement of the head.  She has been previously been eval by ENT.  No recent falls.  Patient does continue have palpitations hot flashes.  Her Synthroid is currently being adjusted by her PCP.  Review of Systems  Constitutional: Positive for malaise/fatigue. Negative for chills, diaphoresis, fever and weight loss.  HENT: Negative for nosebleeds and sore throat.   Eyes: Negative for double vision.  Respiratory: Negative for cough, hemoptysis, sputum production, shortness of breath and wheezing.   Cardiovascular: Positive for palpitations.  Negative for chest pain, orthopnea and leg swelling.  Gastrointestinal: Negative for abdominal pain, blood in stool, constipation, diarrhea, heartburn, melena, nausea and vomiting.  Genitourinary: Negative for dysuria, frequency and urgency.  Musculoskeletal: Positive for back pain and joint pain.  Skin: Negative.  Negative for itching and rash.  Neurological: Negative for dizziness, tingling, focal weakness, weakness and headaches.  Endo/Heme/Allergies: Does not bruise/bleed easily.  Psychiatric/Behavioral: Negative for depression. The patient is not nervous/anxious and does not have insomnia.       PAST MEDICAL HISTORY :  Past Medical History:  Diagnosis Date  . Anxiety   . GERD (gastroesophageal reflux disease)   . History of appendectomy    30 plus yrs ago  . Hypertension   . Hypothyroidism   . Kidney stones   . Non Hodgkin's lymphoma ([place]) 2015  . Personal history of chemotherapy   . S/P partial hysterectomy   . Thyroid disease    hypothyroidism    PAST SURGICAL HISTORY :   Past Surgical History:  Procedure Laterality Date  . ABDOMINAL HYSTERECTOMY    . APPENDECTOMY    . AXILLARY LYMPH NODE BIOPSY Left 2015  . BREAST BIOPSY Right 07/15/1995   stereotactic biopsy at [facility]- neg  . CARPAL TUNNEL RELEASE    . CERVICAL SPINE SURGERY    . COLONOSCOPY WITH PROPOFOL N/A 12/20/2015   Procedure: COLONOSCOPY WITH PROPOFOL;  Surgeon: Hulen Luster, MD;  Location: [facility] ENDOSCOPY;  Service: Gastroenterology;  Laterality: N/A;  . ESOPHAGOGASTRODUODENOSCOPY (EGD) WITH PROPOFOL N/A 12/20/2015   Procedure: ESOPHAGOGASTRODUODENOSCOPY (EGD) WITH PROPOFOL;  Surgeon: Hulen Luster, MD;  Location: [facility] ENDOSCOPY;  Service: Gastroenterology;  Laterality: N/A;  . JOINT REPLACEMENT Left 01/2015   knee  . TONSILLECTOMY      FAMILY HISTORY :   Family History  Problem Relation Age of Onset  . Cancer Other        breast cancer, colon cancer, lymphoma, ovarian cancer  . Diabetes Other   . Anemia Other    . CAD Other   . Liver disease Other   . Breast cancer Daughter 56  . Lymphoma Mother   . Pancreatic cancer Father   . Liver cancer Father   . Breast cancer Daughter 95    SOCIAL HISTORY:   Social History   Tobacco Use  . Smoking status: Former Smoker    Packs/day: 1.00    Years: 20.00    Pack years: 20.00    Last attempt to quit: 01/27/1994    Years since quitting: 24.6  . Smokeless tobacco: Never Used  Substance Use Topics  . Alcohol use: No    Alcohol/week: 0.0 standard drinks  . Drug use: No    ALLERGIES:  is allergic to metoclopramide; percocet [oxycodone-acetaminophen]; and sulfa antibiotics.  MEDICATIONS:  Current Outpatient Medications  Medication Sig Dispense Refill  . ALPRAZolam (XANAX) 0.25 MG tablet Take 0.25 mg by mouth at bedtime as needed for anxiety.    Marland Kitchen amitriptyline (ELAVIL) 25 MG tablet Take 25 mg by mouth as needed (for head aches).     Marland Kitchen aspirin 81 MG tablet Take 81 mg by mouth every morning. Reported on 02/06/2016    . diclofenac (VOLTAREN) 75 MG EC tablet Take 1 tablet (75 mg total) by mouth 2 (two) times daily. 30 tablet 0  . fluticasone (FLONASE) 50 MCG/ACT nasal spray Place 1 spray into both nostrils daily.     . hydrocortisone 2.5 % cream APPLY TOPICALLY 2 (TWO) TIMES DAILY. USE ON AFFECTED AREA X 10 DAYS  2  . HYDROmorphone (DILAUDID) 4 MG tablet Take 1 tablet (4 mg total) by mouth every 4 (four) hours as needed for severe pain. 30 tablet 0  . levothyroxine (SYNTHROID, LEVOTHROID) 100 MCG tablet Take by mouth. Patient taking on Saturday and Sunday only    . levothyroxine (SYNTHROID, LEVOTHROID) 112 MCG tablet Take by mouth.    . meclizine (ANTIVERT) 25 MG tablet Take 25 mg by mouth as needed.    Marland Kitchen omeprazole (PRILOSEC) 20 MG capsule Take 20 mg by mouth every morning.     . propranolol (INDERAL) 20 MG tablet Take by mouth.     No current facility-administered medications for this visit.     PHYSICAL EXAMINATION: ECOG PERFORMANCE STATUS: 0 -  Asymptomatic  BP (!) 143/85 (BP Location: Left Arm, Patient Position: Sitting)   Pulse 69   Temp 97.8 F (36.6 C) (Tympanic)   Resp 16   Wt 193 lb (87.5 kg)   BMI 33.13 kg/m   Filed Weights   09/06/18 1017  Weight: 193 lb (87.5 kg)    Physical Exam  Constitutional: She is oriented to person, place, and time and well-developed, well-nourished, and in no distress.  She is walking herself.  She is with her husband.  HENT:  Head: Normocephalic and atraumatic.  Mouth/Throat: Oropharynx is clear and moist. No oropharyngeal exudate.  Eyes: Pupils are equal, round, and reactive to light.  Neck: Normal range of motion. Neck supple.  Cardiovascular: Normal rate and regular rhythm.  Pulmonary/Chest: No respiratory distress. She has no wheezes.  Abdominal: Soft. Bowel sounds are normal. She exhibits no  distension and no mass. There is no tenderness. There is no rebound and no guarding.  Musculoskeletal: Normal range of motion. She exhibits no edema or tenderness.  Neurological: She is alert and oriented to person, place, and time.  Skin: Skin is warm.  Psychiatric: Affect normal.      LABORATORY DATA:  I have reviewed the data as listed    Component Value Date/Time   NA 140 09/06/2018 0949   NA 139 01/08/2015 1058   K 4.7 09/06/2018 0949   K 4.3 01/08/2015 1058   CL 105 09/06/2018 0949   CL 107 01/08/2015 1058   CO2 28 09/06/2018 0949   CO2 26 01/08/2015 1058   GLUCOSE 117 (H) 09/06/2018 0949   GLUCOSE 96 01/08/2015 1058   BUN 20 09/06/2018 0949   BUN 11 01/08/2015 1058   CREATININE 0.56 09/06/2018 0949   CREATININE 0.63 01/08/2015 1058   CALCIUM 9.7 09/06/2018 0949   CALCIUM 9.3 01/08/2015 1058   PROT 7.0 09/06/2018 0949   PROT 7.0 07/11/2014 1010   ALBUMIN 4.3 09/06/2018 0949   ALBUMIN 3.6 07/11/2014 1010   AST 20 09/06/2018 0949   AST 23 07/11/2014 1010   ALT 17 09/06/2018 0949   ALT 25 07/11/2014 1010   ALKPHOS 91 09/06/2018 0949   ALKPHOS 84 07/11/2014 1010    BILITOT 0.8 09/06/2018 0949   BILITOT 0.4 07/11/2014 1010   GFRNONAA >60 09/06/2018 0949   GFRNONAA >60 01/08/2015 1058   GFRNONAA >60 07/11/2014 1010   GFRAA >60 09/06/2018 0949   GFRAA >60 01/08/2015 1058   GFRAA >60 07/11/2014 1010    No results found for: SPEP, UPEP  Lab Results  Component Value Date   WBC 6.2 09/06/2018   NEUTROABS 2.4 09/06/2018   HGB 14.9 09/06/2018   HCT 43.8 09/06/2018   MCV 94.9 09/06/2018   PLT 263 09/06/2018      Chemistry      Component Value Date/Time   NA 140 09/06/2018 0949   NA 139 01/08/2015 1058   K 4.7 09/06/2018 0949   K 4.3 01/08/2015 1058   CL 105 09/06/2018 0949   CL 107 01/08/2015 1058   CO2 28 09/06/2018 0949   CO2 26 01/08/2015 1058   BUN 20 09/06/2018 0949   BUN 11 01/08/2015 1058   CREATININE 0.56 09/06/2018 0949   CREATININE 0.63 01/08/2015 1058      Component Value Date/Time   CALCIUM 9.7 09/06/2018 0949   CALCIUM 9.3 01/08/2015 1058   ALKPHOS 91 09/06/2018 0949   ALKPHOS 84 07/11/2014 1010   AST 20 09/06/2018 0949   AST 23 07/11/2014 1010   ALT 17 09/06/2018 0949   ALT 25 07/11/2014 1010   BILITOT 0.8 09/06/2018 0949   BILITOT 0.4 07/11/2014 1010       RADIOGRAPHIC STUDIES: I have personally reviewed the radiological images as listed and agreed with the findings in the report. No results found.   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  Currently on surveillance.  #However worsening abdominal pain/night sweats-question worsening/progression of lymphoma.  Recommend PET scan for further evaluation.  # right ovarian mass- declines any GYN oncology evaluation; await above PET scan.  # Dizziness/unsteady gait- [MV.[id number]]; wants to see Dr.Vaught. Wants to hold off MRI brain.   # back/ joint pains-worse; await above work-up.  # Itrarogenic  hyperthrydiosm/hypothyroidism-monitor closely by PCP Synthroid-defer  to Dr.Hande  # Get PET ASAP; follow up based on the results of above work up; [phone].  Keep November appointment as planned.    Orders Placed This Encounter  Procedures  . NM PET Image Restag (PS) Skull Base To Thigh    Standing Status:   Future    Standing Expiration Date:   09/06/2019    Order Specific Question:   ** REASON FOR EXAM (FREE TEXT)    Answer:   marginal zone lymphoma    Order Specific Question:   If indicated for the ordered procedure, I authorize the administration of a radiopharmaceutical per Radiology protocol    Answer:   Yes    Order Specific Question:   Preferred imaging location?    Answer:   [facility]    Order Specific Question:   Radiology Contrast Protocol - do NOT remove file path    Answer:   [email or web address]   All questions were answered. The patient knows to call the clinic with any problems, questions or concerns.      Cammie Sickle, MD 09/06/2018 12:53 PM

## 2018-09-06 NOTE — Assessment & Plan Note (Addendum)
#   Marginal Zone lymphoma-stage IV; PET scan February 2019-stable axillary abdominal pelvic lymph nodes.  Currently on surveillance.  #However worsening abdominal pain/night sweats-question worsening/progression of lymphoma.  Recommend PET scan for further evaluation.  # right ovarian mass- declines any GYN oncology evaluation; await above PET scan.  # Dizziness/unsteady gait- [[id number]]; wants to see Dr.Vaught. Wants to hold off MRI brain.   # back/ joint pains-worse; await above work-up.  # Itrarogenic hyperthrydiosm/hypothyroidism-monitor closely by PCP Synthroid-defer to Dr.Hande  # Get PET ASAP; follow up based on the results of above work up; [phone].  Keep November appointment as planned.

## 2018-09-09 ENCOUNTER — Ambulatory Visit
Admission: RE | Admit: 2018-09-09 | Discharge: 2018-09-09 | Disposition: A | Discharge status: Home / Self Care | Payer: Medicare Other | Source: Ambulatory Visit | Attending: Internal Medicine | Admitting: Internal Medicine

## 2018-09-09 DIAGNOSIS — I517 Cardiomegaly: Secondary | ICD-10-CM | POA: Diagnosis not present

## 2018-09-09 DIAGNOSIS — N200 Calculus of kidney: Secondary | ICD-10-CM | POA: Diagnosis not present

## 2018-09-09 DIAGNOSIS — C8588 Other specified types of non-Hodgkin lymphoma, lymph nodes of multiple sites: Secondary | ICD-10-CM | POA: Diagnosis present

## 2018-09-09 DIAGNOSIS — I700 Atherosclerosis of aorta: Secondary | ICD-10-CM | POA: Insufficient documentation

## 2018-09-09 DIAGNOSIS — K5730 Diverticulosis of large intestine without perforation or abscess without bleeding: Secondary | ICD-10-CM | POA: Insufficient documentation

## 2018-09-09 DIAGNOSIS — I2510 Atherosclerotic heart disease of native coronary artery without angina pectoris: Secondary | ICD-10-CM | POA: Diagnosis not present

## 2018-09-09 LAB — GLUCOSE, CAPILLARY: Glucose-Capillary: 101 mg/dL — ABNORMAL HIGH (ref 70–99)

## 2018-09-09 MED ORDER — FLUDEOXYGLUCOSE F - 18 (FDG) INJECTION
10.0000 | Freq: Once | INTRAVENOUS | Status: AC | PRN
  Administered 2018-09-09: 10.3 via INTRAVENOUS

## 2018-09-13 ENCOUNTER — Telehealth: Payer: Self-pay | Admitting: *Deleted

## 2018-09-13 NOTE — Telephone Encounter (Signed)
Patient called asking for results of PET scan, She does not have follow up appointment until 10/29/18.  IMPRESSION: 1. Mildly increased activity in pathologic axillary and left pelvic lymph nodes. The dominant 2.4 cm in short axis left external iliac lymph node has minimally enlarged and currently has a maximum SUV of 4.5, Deauville 4. The axillary lymph nodes range from Deauville 2 through Deauville 4. 2. Other imaging findings of potential clinical significance: Aortic Atherosclerosis (ICD10-I70.0). Coronary atherosclerosis. Mild cardiomegaly. Nonobstructive right nephrolithiasis. Sigmoid colon diverticulosis. 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 No splenomegaly or focal splenic lesion.  Deauville Scale:  1. No uptake or no residual uptake (when used interim)  2. Slight uptake, but below blood pool (mediastinum)  3. Uptake above mediastinal, but below or equal to uptake in the liver  4. Uptake slightly to moderately higher than liver  5. Markedly increased uptake or any new lesion (on response evaluation)   Electronically Signed   By: Van Clines M.D.   On: 09/09/2018 13:49

## 2018-09-14 NOTE — Telephone Encounter (Signed)
Patient calling again today requesting results of her PET Scan

## 2018-09-15 ENCOUNTER — Telehealth: Payer: Self-pay | Admitting: Internal Medicine

## 2018-09-15 NOTE — Telephone Encounter (Signed)
md spoke with patient- see md phone note

## 2018-09-15 NOTE — Telephone Encounter (Signed)
Spoke to patient regarding the PET scan.  Concerned about ongoing night sweats extreme fatigue poor quality of life.  Question related to underlying lymphoma. Discussed re: ibrutinib.  Please schedule follow-up 10/10 [tomorrow]; 8: 30 a.m.  No labs.  Patient will arrive 15 minutes early. Thx

## 2018-09-16 ENCOUNTER — Telehealth: Payer: Self-pay | Admitting: Pharmacy Technician

## 2018-09-16 ENCOUNTER — Telehealth: Payer: Self-pay | Admitting: Pharmacist

## 2018-09-16 ENCOUNTER — Inpatient Hospital Stay: Payer: Medicare Other | Attending: Internal Medicine | Admitting: Internal Medicine

## 2018-09-16 VITALS — BP 148/81 | HR 61 | Temp 97.3°F | Resp 16 | Wt 191.0 lb

## 2018-09-16 DIAGNOSIS — R232 Flushing: Secondary | ICD-10-CM | POA: Insufficient documentation

## 2018-09-16 DIAGNOSIS — Z9221 Personal history of antineoplastic chemotherapy: Secondary | ICD-10-CM | POA: Insufficient documentation

## 2018-09-16 DIAGNOSIS — E039 Hypothyroidism, unspecified: Secondary | ICD-10-CM | POA: Diagnosis not present

## 2018-09-16 DIAGNOSIS — C8588 Other specified types of non-Hodgkin lymphoma, lymph nodes of multiple sites: Secondary | ICD-10-CM | POA: Diagnosis not present

## 2018-09-16 DIAGNOSIS — C773 Secondary and unspecified malignant neoplasm of axilla and upper limb lymph nodes: Secondary | ICD-10-CM | POA: Diagnosis not present

## 2018-09-16 DIAGNOSIS — R109 Unspecified abdominal pain: Secondary | ICD-10-CM | POA: Diagnosis not present

## 2018-09-16 DIAGNOSIS — Z8041 Family history of malignant neoplasm of ovary: Secondary | ICD-10-CM | POA: Diagnosis not present

## 2018-09-16 DIAGNOSIS — I10 Essential (primary) hypertension: Secondary | ICD-10-CM | POA: Diagnosis not present

## 2018-09-16 DIAGNOSIS — R42 Dizziness and giddiness: Secondary | ICD-10-CM | POA: Insufficient documentation

## 2018-09-16 DIAGNOSIS — Z7982 Long term (current) use of aspirin: Secondary | ICD-10-CM | POA: Insufficient documentation

## 2018-09-16 DIAGNOSIS — Z806 Family history of leukemia: Secondary | ICD-10-CM | POA: Insufficient documentation

## 2018-09-16 DIAGNOSIS — R531 Weakness: Secondary | ICD-10-CM | POA: Diagnosis not present

## 2018-09-16 DIAGNOSIS — R1900 Intra-abdominal and pelvic swelling, mass and lump, unspecified site: Secondary | ICD-10-CM | POA: Diagnosis not present

## 2018-09-16 DIAGNOSIS — Z800 Family history of malignant neoplasm of digestive organs: Secondary | ICD-10-CM | POA: Diagnosis not present

## 2018-09-16 DIAGNOSIS — Z87442 Personal history of urinary calculi: Secondary | ICD-10-CM | POA: Insufficient documentation

## 2018-09-16 DIAGNOSIS — F419 Anxiety disorder, unspecified: Secondary | ICD-10-CM | POA: Diagnosis not present

## 2018-09-16 DIAGNOSIS — Z803 Family history of malignant neoplasm of breast: Secondary | ICD-10-CM | POA: Insufficient documentation

## 2018-09-16 DIAGNOSIS — Z79899 Other long term (current) drug therapy: Secondary | ICD-10-CM | POA: Insufficient documentation

## 2018-09-16 DIAGNOSIS — Z87891 Personal history of nicotine dependence: Secondary | ICD-10-CM | POA: Diagnosis not present

## 2018-09-16 DIAGNOSIS — M549 Dorsalgia, unspecified: Secondary | ICD-10-CM | POA: Insufficient documentation

## 2018-09-16 DIAGNOSIS — K219 Gastro-esophageal reflux disease without esophagitis: Secondary | ICD-10-CM

## 2018-09-16 DIAGNOSIS — R5383 Other fatigue: Secondary | ICD-10-CM | POA: Diagnosis not present

## 2018-09-16 MED ORDER — IBRUTINIB 420 MG PO TABS: 420.0000 mg | ORAL_TABLET | Freq: Every day | ORAL | 3 refills | Status: DC

## 2018-09-16 NOTE — Telephone Encounter (Signed)
Oral Oncology Patient Advocate Encounter  Received notification from [facility] that prior authorization for Imbruvica is required.  PA submitted on CoverMyMeds Key [id number] Status is pending  Oral Oncology Clinic will continue to follow.  Kaitlin Thompson Phone [phone] Fax [phone] 09/16/2018 2:43 PM

## 2018-09-16 NOTE — Telephone Encounter (Signed)
Oral Oncology Pharmacist Encounter  Received new prescription for Imbruvica (ibrutinib) for the treatment of marginal zone lymphoma, planned duration until disease progression or unacceptable drug toxicity.  CBC/CMP from 09/06/18 assessed, no relevant lab abnormalities. BP from 09/16/18 reviewed, BP slightly elevated, continue to monitor. Prescription dose and frequency assessed.   Current medication list in Epic reviewed, one DDIs with ibrutinib identified: - Ibrutinib may enhance the adverse effect of antiplatelet agents such as aspirin. Monitor patient for s/sx of bleeding and monitor pltc.  Prescription has been e-scribed to the [facility] for benefits analysis and approval.  Patient education Counseled patient on administration, dosing, side effects, monitoring, drug-food interactions, safe handling, storage, and disposal. Patient will take 420 mg by mouth daily.  Side effects include but not limited to: N/V, decreased hgb/plt/wbc, diarrhea, fatigue.    Reviewed with patient importance of keeping a medication schedule and plan for any missed doses.  [name] voiced understanding and appreciation. All questions answered. Medication handout provided and consent obtained.  Oral Oncology Clinic will continue to follow for insurance authorization, copayment issues, and start date.  Provided patient with Oral [place] [facility] phone number. Patient knows to call the office with questions or concerns. Oral Chemotherapy Navigation Clinic will continue to follow.  Darl Pikes, PharmD, BCPS, [facility] Hematology/Oncology Clinical Pharmacist [facility]/AP [facility] [place] [facility] [phone]  09/16/2018 1:32 PM

## 2018-09-16 NOTE — Assessment & Plan Note (Addendum)
#   Marginal Zone lymphoma-stage IV; PET scan  OCT 2019--Stable axillary abdominal pelvic lymph nodes; largest pelvic lymph nodes approximately 2.5 cm in size.  SUV 4.5.   #I had a long discussion the patient her PET scan is quite unremarkable from lymphoma standpoint/to be symptomatic causing her symptoms.  However fatigue could be explained from the underlying lymphoma.   #Given the absence of any other explanation for her symptoms-recommend a trial of ibrutinib 420 mg once a day.  Discussed the response rates are anywhere between 40 to 60%.  #I discussed the potential side effects of ibrutinib including easy bruising; elevated blood pressure; A. Fib; and risk of infections.  # right ovarian mass-not evident on the PET scan ; pt had declined GYN oncology evaluation.  Monitor for now.  # Dizziness/unsteady gait- [[id number]]; wants to see Dr.Vaught./ defer to ENT.   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  However discussed that if she is getting injections-need to be held while on ibrutinib given the risk of bruising/easy bleeding.  # Itrarogenic hyperthrydiosm/hypothyroidism-monitor closely by PCP Synthroid-defer to Dr.Hande  #Follow-up as planned on November 22; discussed with Ebony Hail.  # I reviewed the blood work- with the patient in detail; also reviewed the imaging independently [as summarized above]; and with the patient in detail.   # 40 minutes face-to-face with the patient discussing the above plan of care; more than 50% of time spent on prognosis/ natural history; counseling and coordination.

## 2018-09-16 NOTE — Progress Notes (Signed)
[facility] OFFICE PROGRESS NOTE  Patient Care Team: Tracie Harrier, MD as PCP - General (Internal Medicine) Benjaman Kindler, MD as Consulting Physician (Obstetrics and Gynecology) Clent Jacks, RN as Registered Nurse  Cancer Staging No matching staging information was found for the patient.   Oncology History   # MAY 2015- Diagnosis of   low-grade B-cell lymphoma marginal zone.  From the left axillary lymph node biopsy.  Patient had a weekly rituximab therapy in May 2015  # April 2017-   Progressive disease with enlargement of lymphadenopathy on a subsequent CT scan;APril 2017- Rituxan weekly x4  # October 2019- PET-STABLE; but symtomatic [extreme fatigue]  #  Right adnexal mass and followed by Dr. Retia Passe for 2 years]; monitored.   ----------------------------------     DIAGNOSIS: [ ]  Marginal zone lymphoma  STAGE: 4  ;GOALS: Control  CURRENT/MOST RECENT THERAPY: START Ibrutinib 420       Marginal zone lymphoma of lymph nodes of multiple sites [facility])      INTERVAL HISTORY:  Kaitlin Thompson 67 y.o.  female pleasant patient above history of marginal zone lymphoma is here for follow-up/review this of the PET scan.  Patient continues to have multiple complaints-including worsening back pain/abdominal pain rating to the pelvis.  She complains of extreme fatigue.  Also complains of hot flashes/sweating episodes.  Overall feels poorly.  Continued chronic dizziness.  Review of Systems  Constitutional: Positive for malaise/fatigue. Negative for chills, diaphoresis, fever and weight loss.  HENT: Negative for nosebleeds and sore throat.   Eyes: Negative for double vision.  Respiratory: Negative for cough, hemoptysis, sputum production, shortness of breath and wheezing.   Cardiovascular: Negative for chest pain, orthopnea and leg swelling.  Gastrointestinal: Negative for abdominal pain, blood in stool, constipation, diarrhea,  heartburn, melena, nausea and vomiting.  Genitourinary: Negative for dysuria, frequency and urgency.  Musculoskeletal: Positive for back pain and joint pain.  Skin: Negative.  Negative for itching and rash.  Neurological: Negative for dizziness, tingling, focal weakness, weakness and headaches.  Endo/Heme/Allergies: Does not bruise/bleed easily.  Psychiatric/Behavioral: Negative for depression. The patient is not nervous/anxious and does not have insomnia.       PAST MEDICAL HISTORY :  Past Medical History:  Diagnosis Date  . Anxiety   . GERD (gastroesophageal reflux disease)   . History of appendectomy    30 plus yrs ago  . Hypertension   . Hypothyroidism   . Kidney stones   . Non Hodgkin's lymphoma ([place]) 2015  . Personal history of chemotherapy   . S/P partial hysterectomy   . Thyroid disease    hypothyroidism    PAST SURGICAL HISTORY :   Past Surgical History:  Procedure Laterality Date  . ABDOMINAL HYSTERECTOMY    . APPENDECTOMY    . AXILLARY LYMPH NODE BIOPSY Left 2015  . BREAST BIOPSY Right 07/15/1995   stereotactic biopsy at [facility]- neg  . CARPAL TUNNEL RELEASE    . CERVICAL SPINE SURGERY    . COLONOSCOPY WITH PROPOFOL N/A 12/20/2015   Procedure: COLONOSCOPY WITH PROPOFOL;  Surgeon: Hulen Luster, MD;  Location[facility] ENDOSCOPY;  Service: Gastroenterology;  Laterality: N/A;  . ESOPHAGOGASTRODUODENOSCOPY (EGD) WITH PROPOFOL N/A 12/20/2015   Procedure: ESOPHAGOGASTRODUODENOSCOPY (EGD) WITH PROPOFOL;  Surgeon: Hulen Luster, MD;  Location: [facility] ENDOSCOPY;  Service: Gastroenterology;  Laterality: N/A;  . JOINT REPLACEMENT Left 01/2015   knee  . TONSILLECTOMY      FAMILY HISTORY :   Family History  Problem  Relation Age of Onset  . Cancer Other        breast cancer, colon cancer, lymphoma, ovarian cancer  . Diabetes Other   . Anemia Other   . CAD Other   . Liver disease Other   . Breast cancer Daughter 37  . Lymphoma Mother   . Pancreatic cancer Father   . Liver cancer  Father   . Breast cancer Daughter 47    SOCIAL HISTORY:   Social History   Tobacco Use  . Smoking status: Former Smoker    Packs/day: 1.00    Years: 20.00    Pack years: 20.00    Last attempt to quit: 01/27/1994    Years since quitting: 24.6  . Smokeless tobacco: Never Used  Substance Use Topics  . Alcohol use: No    Alcohol/week: 0.0 standard drinks  . Drug use: No    ALLERGIES:  is allergic to metoclopramide; percocet [oxycodone-acetaminophen]; and sulfa antibiotics.  MEDICATIONS:  Current Outpatient Medications  Medication Sig Dispense Refill  . ALPRAZolam (XANAX) 0.25 MG tablet Take 0.25 mg by mouth at bedtime as needed for anxiety.    Marland Kitchen amitriptyline (ELAVIL) 25 MG tablet Take 25 mg by mouth as needed (for head aches).     Marland Kitchen aspirin 81 MG tablet Take 81 mg by mouth every morning. Reported on 02/06/2016    . fluticasone (FLONASE) 50 MCG/ACT nasal spray Place 1 spray into both nostrils daily.     Marland Kitchen levothyroxine (SYNTHROID, LEVOTHROID) 100 MCG tablet Take 88 mcg by mouth. Patient taking on Saturday and Sunday only    . levothyroxine (SYNTHROID, LEVOTHROID) 112 MCG tablet Take by mouth.    . meclizine (ANTIVERT) 25 MG tablet Take 25 mg by mouth as needed.    Marland Kitchen omeprazole (PRILOSEC) 20 MG capsule Take 20 mg by mouth every morning.     . propranolol (INDERAL) 20 MG tablet Take by mouth.    . Ibrutinib 420 MG TABS Take 420 mg by mouth daily. 30 tablet 3   No current facility-administered medications for this visit.     PHYSICAL EXAMINATION: ECOG PERFORMANCE STATUS: 1 - Symptomatic but completely ambulatory  BP (!) 148/81 (BP Location: Left Arm, Patient Position: Sitting)   Pulse 61   Temp (!) 97.3 F (36.3 C)   Resp 16   Wt 191 lb (86.6 kg)   BMI 32.79 kg/m   Filed Weights   09/16/18 0842  Weight: 191 lb (86.6 kg)    Physical Exam  Constitutional: She is oriented to person, place, and time and well-developed, well-nourished, and in no distress.  She is walking  herself.  She is with her daughter.  HENT:  Head: Normocephalic and atraumatic.  Mouth/Throat: Oropharynx is clear and moist. No oropharyngeal exudate.  Eyes: Pupils are equal, round, and reactive to light.  Neck: Normal range of motion. Neck supple.  Cardiovascular: Normal rate and regular rhythm.  Pulmonary/Chest: No respiratory distress. She has no wheezes.  Abdominal: Soft. Bowel sounds are normal. She exhibits no distension and no mass. There is no tenderness. There is no rebound and no guarding.  Musculoskeletal: Normal range of motion. She exhibits no edema or tenderness.  Neurological: She is alert and oriented to person, place, and time.  Skin: Skin is warm.  Psychiatric: Affect normal.      LABORATORY DATA:  I have reviewed the data as listed    Component Value Date/Time   NA 140 09/06/2018 0949   NA 139 01/08/2015 1058  K 4.7 09/06/2018 0949   K 4.3 01/08/2015 1058   CL 105 09/06/2018 0949   CL 107 01/08/2015 1058   CO2 28 09/06/2018 0949   CO2 26 01/08/2015 1058   GLUCOSE 117 (H) 09/06/2018 0949   GLUCOSE 96 01/08/2015 1058   BUN 20 09/06/2018 0949   BUN 11 01/08/2015 1058   CREATININE 0.56 09/06/2018 0949   CREATININE 0.63 01/08/2015 1058   CALCIUM 9.7 09/06/2018 0949   CALCIUM 9.3 01/08/2015 1058   PROT 7.0 09/06/2018 0949   PROT 7.0 07/11/2014 1010   ALBUMIN 4.3 09/06/2018 0949   ALBUMIN 3.6 07/11/2014 1010   AST 20 09/06/2018 0949   AST 23 07/11/2014 1010   ALT 17 09/06/2018 0949   ALT 25 07/11/2014 1010   ALKPHOS 91 09/06/2018 0949   ALKPHOS 84 07/11/2014 1010   BILITOT 0.8 09/06/2018 0949   BILITOT 0.4 07/11/2014 1010   GFRNONAA >60 09/06/2018 0949   GFRNONAA >60 01/08/2015 1058   GFRNONAA >60 07/11/2014 1010   GFRAA >60 09/06/2018 0949   GFRAA >60 01/08/2015 1058   GFRAA >60 07/11/2014 1010    No results found for: SPEP, UPEP  Lab Results  Component Value Date   WBC 6.2 09/06/2018   NEUTROABS 2.4 09/06/2018   HGB 14.9 09/06/2018    HCT 43.8 09/06/2018   MCV 94.9 09/06/2018   PLT 263 09/06/2018      Chemistry      Component Value Date/Time   NA 140 09/06/2018 0949   NA 139 01/08/2015 1058   K 4.7 09/06/2018 0949   K 4.3 01/08/2015 1058   CL 105 09/06/2018 0949   CL 107 01/08/2015 1058   CO2 28 09/06/2018 0949   CO2 26 01/08/2015 1058   BUN 20 09/06/2018 0949   BUN 11 01/08/2015 1058   CREATININE 0.56 09/06/2018 0949   CREATININE 0.63 01/08/2015 1058      Component Value Date/Time   CALCIUM 9.7 09/06/2018 0949   CALCIUM 9.3 01/08/2015 1058   ALKPHOS 91 09/06/2018 0949   ALKPHOS 84 07/11/2014 1010   AST 20 09/06/2018 0949   AST 23 07/11/2014 1010   ALT 17 09/06/2018 0949   ALT 25 07/11/2014 1010   BILITOT 0.8 09/06/2018 0949   BILITOT 0.4 07/11/2014 1010       RADIOGRAPHIC STUDIES: I have personally reviewed the radiological images as listed and agreed with the findings in the report. No results found.   ASSESSMENT & PLAN:  Marginal zone lymphoma of lymph nodes of multiple sites ([place]) # Marginal Zone lymphoma-stage IV; PET scan  OCT 2019--Stable axillary abdominal pelvic lymph nodes; largest pelvic lymph nodes approximately 2.5 cm in size.  SUV 4.5.   #I had a long discussion the patient her PET scan is quite unremarkable from lymphoma standpoint/to be symptomatic causing her symptoms.  However fatigue could be explained from the underlying lymphoma.   #Given the absence of any other explanation for her symptoms-recommend a trial of ibrutinib 420 mg once a day.  Discussed the response rates are anywhere between 40 to 60%.  #I discussed the potential side effects of ibrutinib including easy bruising; elevated blood pressure; A. Fib; and risk of infections.  # right ovarian mass-not evident on the PET scan ; pt had declined GYN oncology evaluation.  Monitor for now.  # Dizziness/unsteady gait- [[id number]]; wants to see Dr.Vaught./ defer to ENT.   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  However discussed that if she is getting injections-need to be held while on ibrutinib given the risk of bruising/easy bleeding.  # Itrarogenic hyperthrydiosm/hypothyroidism-monitor closely by PCP Synthroid-defer to Dr.Hande  #Follow-up as planned on November 22; discussed with Ebony Hail.  # I reviewed the blood work- with the patient in detail; also reviewed the imaging independently [as summarized above]; and with the patient in detail.   # 40 minutes face-to-face with the patient discussing the above plan of care; more than 50% of time spent on prognosis/ natural history; counseling and coordination.    No orders of the defined types were placed in this encounter.  All questions were answered. The patient knows to call the clinic with any problems, questions or concerns.      Cammie Sickle, MD 09/16/2018 10:50 AM

## 2018-09-21 ENCOUNTER — Telehealth: Payer: Self-pay | Admitting: Pharmacist

## 2018-09-21 NOTE — Telephone Encounter (Signed)
Oral Chemotherapy Pharmacist Encounter  Successfully enrolled patient for copayment assistance funds from [employer] from the Lymphoma fund.  Award amount: $5000 Effective dates: 06/08/2018 - 09/07/19 ID: [id number] BIN: [id number] Group: [id number] PCN: [id number]  Darl Pikes, PharmD, BCPS Hematology/Oncology Clinical Pharmacist [facility]/AP Oral [place] [facility] [phone]  09/21/2018 4:25 PM

## 2018-09-21 NOTE — Telephone Encounter (Signed)
Oral Oncology Pharmacist Encounter   Prior Authorization for Kaitlin Thompson has been denied by [facility].     Denial appeal was faxed today 09/21/18. Called patient to provide her with an update.    Oral Oncology Clinic will continue to follow.   Darl Pikes, PharmD, BCPS. BCOP Hematology/Oncology Clinical Pharmacist [facility]/AP Oral Chemotherapy Navigation Clinic [phone]  09/21/2018 4:20 PM

## 2018-09-29 NOTE — Telephone Encounter (Addendum)
Oral Oncology Patient Advocate Encounter  Received notification from [facility] that the appeal for Imbruvica has been approved and the denial has been overturned.  Approval dates: 09/17/18 until further notice.  Quantity approved: 28 tablets per 28 days.  Copay is $2406.94.  Patient has been approved for a grant that will cover $5000 of the medication out of pocket expense.  [place] [facility] [place] Phone [phone] Fax [phone] 09/29/2018 3:13 PM

## 2018-09-30 MED FILL — IMBRUVICA 420 MG TAB: 420 | 28 days supply | Qty: 28 | Fill #0

## 2018-10-12 ENCOUNTER — Telehealth: Payer: Self-pay | Admitting: *Deleted

## 2018-10-12 NOTE — Telephone Encounter (Signed)
Patient called reporting that she is scheduled for steroidal back injections (Dexamethasone) on 11/13 and is asklng if Dr B will OK for them to be given with her being on chemotherapy pills. She has the name of medicine to be used. Please return her call [phone]

## 2018-10-13 NOTE — Telephone Encounter (Signed)
Per VO Dr Rogue Bussing, [facility] to proceed with injections. Patient informed

## 2018-10-26 MED FILL — IMBRUVICA 420 MG TAB: 420 | 28 days supply | Qty: 28 | Fill #1

## 2018-10-29 ENCOUNTER — Other Ambulatory Visit: Payer: Self-pay

## 2018-10-29 ENCOUNTER — Encounter: Payer: Self-pay | Admitting: Internal Medicine

## 2018-10-29 ENCOUNTER — Inpatient Hospital Stay: Payer: Medicare Other | Attending: Internal Medicine

## 2018-10-29 ENCOUNTER — Inpatient Hospital Stay (HOSPITAL_BASED_OUTPATIENT_CLINIC_OR_DEPARTMENT_OTHER): Payer: Medicare Other | Admitting: Internal Medicine

## 2018-10-29 VITALS — BP 169/88 | HR 59 | Temp 97.1°F | Resp 16 | Wt 195.0 lb

## 2018-10-29 DIAGNOSIS — Z7982 Long term (current) use of aspirin: Secondary | ICD-10-CM

## 2018-10-29 DIAGNOSIS — K219 Gastro-esophageal reflux disease without esophagitis: Secondary | ICD-10-CM | POA: Diagnosis not present

## 2018-10-29 DIAGNOSIS — Z9221 Personal history of antineoplastic chemotherapy: Secondary | ICD-10-CM | POA: Diagnosis not present

## 2018-10-29 DIAGNOSIS — E039 Hypothyroidism, unspecified: Secondary | ICD-10-CM | POA: Insufficient documentation

## 2018-10-29 DIAGNOSIS — Z87891 Personal history of nicotine dependence: Secondary | ICD-10-CM | POA: Insufficient documentation

## 2018-10-29 DIAGNOSIS — K121 Other forms of stomatitis: Secondary | ICD-10-CM

## 2018-10-29 DIAGNOSIS — C8588 Other specified types of non-Hodgkin lymphoma, lymph nodes of multiple sites: Secondary | ICD-10-CM

## 2018-10-29 DIAGNOSIS — Z87442 Personal history of urinary calculi: Secondary | ICD-10-CM

## 2018-10-29 DIAGNOSIS — Z79899 Other long term (current) drug therapy: Secondary | ICD-10-CM

## 2018-10-29 DIAGNOSIS — R232 Flushing: Secondary | ICD-10-CM

## 2018-10-29 DIAGNOSIS — R51 Headache: Secondary | ICD-10-CM | POA: Insufficient documentation

## 2018-10-29 DIAGNOSIS — Z803 Family history of malignant neoplasm of breast: Secondary | ICD-10-CM

## 2018-10-29 DIAGNOSIS — F329 Major depressive disorder, single episode, unspecified: Secondary | ICD-10-CM | POA: Diagnosis not present

## 2018-10-29 DIAGNOSIS — K1379 Other lesions of oral mucosa: Secondary | ICD-10-CM

## 2018-10-29 DIAGNOSIS — I10 Essential (primary) hypertension: Secondary | ICD-10-CM

## 2018-10-29 LAB — CBC WITH DIFFERENTIAL/PLATELET
Abs Immature Granulocytes: 0.17 10*3/uL — ABNORMAL HIGH (ref 0.00–0.07)
BASOS ABS: 0.1 10*3/uL (ref 0.0–0.1)
Basophils Relative: 1 %
EOS ABS: 0.1 10*3/uL (ref 0.0–0.5)
Eosinophils Relative: 1 %
HEMATOCRIT: 40.2 % (ref 36.0–46.0)
Hemoglobin: 13.2 g/dL (ref 12.0–15.0)
IMMATURE GRANULOCYTES: 2 %
LYMPHS ABS: 2.9 10*3/uL (ref 0.7–4.0)
Lymphocytes Relative: 31 %
MCH: 31.4 pg (ref 26.0–34.0)
MCHC: 32.8 g/dL (ref 30.0–36.0)
MCV: 95.7 fL (ref 80.0–100.0)
Monocytes Absolute: 1.9 10*3/uL — ABNORMAL HIGH (ref 0.1–1.0)
Monocytes Relative: 20 %
NEUTROS PCT: 45 %
NRBC: 0 % (ref 0.0–0.2)
Neutro Abs: 4.4 10*3/uL (ref 1.7–7.7)
Platelets: 200 10*3/uL (ref 150–400)
RBC: 4.2 MIL/uL (ref 3.87–5.11)
RDW: 13.6 % (ref 11.5–15.5)
WBC: 9.6 10*3/uL (ref 4.0–10.5)

## 2018-10-29 LAB — COMPREHENSIVE METABOLIC PANEL
ALK PHOS: 72 U/L (ref 38–126)
ALT: 17 U/L (ref 0–44)
ANION GAP: 5 (ref 5–15)
AST: 21 U/L (ref 15–41)
Albumin: 3.6 g/dL (ref 3.5–5.0)
BUN: 17 mg/dL (ref 8–23)
CALCIUM: 9.2 mg/dL (ref 8.9–10.3)
CHLORIDE: 105 mmol/L (ref 98–111)
CO2: 26 mmol/L (ref 22–32)
CREATININE: 0.71 mg/dL (ref 0.44–1.00)
Glucose, Bld: 88 mg/dL (ref 70–99)
Potassium: 4.7 mmol/L (ref 3.5–5.1)
SODIUM: 136 mmol/L (ref 135–145)
Total Bilirubin: 0.7 mg/dL (ref 0.3–1.2)
Total Protein: 6.2 g/dL — ABNORMAL LOW (ref 6.5–8.1)

## 2018-10-29 LAB — LACTATE DEHYDROGENASE: LDH: 177 U/L (ref 98–192)

## 2018-10-29 MED ORDER — MAGIC MOUTHWASH: 5.0000 mL | Freq: Three times a day (TID) | ORAL | 3 refills | Status: DC

## 2018-10-29 MED ORDER — ESCITALOPRAM OXALATE 10 MG PO TABS: 10.0000 mg | ORAL_TABLET | Freq: Every day | ORAL | 3 refills | Status: DC

## 2018-10-29 NOTE — Assessment & Plan Note (Addendum)
#  Marginal Zone lymphoma-stage IV; PET scan  OCT 2019--Stable axillary abdominal pelvic lymph nodes; largest pelvic lymph nodes approximately 2.5 cm in size.  SUV 4.5.   #Patient on Imbruvica for the last 1 month.  Tolerating poorly.  Recommend stopping Imbruvica.  Labs today normal.  #I had a long discussion with the patient regarding my concern for lymphoma causing her symptoms worse low to begin with.  However since she is not tolerating well I would recommend stopping ibrutinib.  #Given the myriad of complaints-discussed regarding depression.  Patient became tearful.  Denies any self-harm.  Patient on amitriptyline for headaches as needed.  Recommend stopping it.  Recommend Lexapro.  Started 10 mg a day.  Will ramp up to 20 at next visit.  # Itrarogenic hyperthrydiosm/hypothyroidism-monitor closely by PCP Synthroid-  #Oral ulcers mucositis question from ibrutinib.  Recommend Magic mouthwash.  Stop ibrutinib.  # DISPOSITION: magic mouth wash-  # follow up in 4 weeks; NO labs- Dr.B

## 2018-10-29 NOTE — Patient Instructions (Signed)
STOP ibrutinib.

## 2018-10-29 NOTE — Progress Notes (Signed)
[place] OFFICE PROGRESS NOTE  Patient Care Team: Tracie Harrier, MD as PCP - General (Internal Medicine) Benjaman Kindler, MD as Consulting Physician (Obstetrics and Gynecology) Clent Jacks, RN as Registered Nurse  Cancer Staging No matching staging information was found for the patient.   Oncology History   # MAY 2015- Diagnosis of   low-grade B-cell lymphoma marginal zone.  From the left axillary lymph node biopsy.  Patient had a weekly rituximab therapy in May 2015  # April 2017-   Progressive disease with enlargement of lymphadenopathy on a subsequent CT scan;APril 2017- Rituxan weekly x4  # October 2019- PET-STABLE; but symtomatic [extreme fatigue]; Ibrutinib 420 mg/day- 1 month; stopped nov 22nd 2019 [intol]  #  Right adnexal mass and followed by Dr. Retia Passe for 2 years]; monitored.   ----------------------------------     DIAGNOSIS: _0  Marginal zone lymphoma  STAGE: 4  ;GOALS: Control  CURRENT/MOST RECENT THERAPY: Stopped ibrutinib.       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  female pleasant patient above history of marginal zone lymphoma currently on Imbruvica 420 mg a day is here for follow-up.   Patient has been on Imbruvica for the last 1 month.  Patient continues to complain of multiple complaints worsening back pain abdominal pain; worsening fatigue.  Also complains of oral sores.  Complains of hot flashes episodes of sweating.  Overall feels worse than prior to starting Imbruvica.  Review of Systems  Constitutional: Positive for malaise/fatigue. Negative for chills, diaphoresis, fever and weight loss.  HENT: Negative for nosebleeds and sore throat.   Eyes: Negative for double vision.  Respiratory: Negative for cough, hemoptysis, sputum production, shortness of breath and wheezing.   Cardiovascular: Negative for chest pain, orthopnea and  leg swelling.  Gastrointestinal: Negative for abdominal pain, blood in stool, constipation, diarrhea, heartburn, melena, nausea and vomiting.  Genitourinary: Negative for dysuria, frequency and urgency.  Musculoskeletal: Positive for back pain and joint pain.  Skin: Negative.  Negative for itching and rash.  Neurological: Negative for dizziness, tingling, focal weakness, weakness and headaches.  Endo/Heme/Allergies: Does not bruise/bleed easily.  Psychiatric/Behavioral: Negative for depression. The patient is not nervous/anxious and does not have insomnia.       PAST MEDICAL HISTORY :  Past Medical History:  Diagnosis Date  . Anxiety   . GERD (gastroesophageal reflux disease)   . History of appendectomy    30 plus yrs ago  . Hypertension   . Hypothyroidism   . Kidney stones   . Non Hodgkin's lymphoma ([place]) 2015  . Personal history of chemotherapy   . S/P partial hysterectomy   . Thyroid disease    hypothyroidism    PAST SURGICAL HISTORY :   Past Surgical History:  Procedure Laterality Date  . ABDOMINAL HYSTERECTOMY    . APPENDECTOMY    . AXILLARY LYMPH NODE BIOPSY Left 2015  . BREAST BIOPSY Right 07/15/1995   stereotactic [facility] at [facility]- neg  . CARPAL TUNNEL RELEASE    . CERVICAL SPINE SURGERY    . COLONOSCOPY WITH PROPOFOL N/A 12/20/2015   Procedure: COLONOSCOPY WITH PROPOFOL;  Surgeon: Hulen Luster, MD;  Location: [facility] ENDOSCOPY;  Service: Gastroenterology;  Laterality: N/A;  . ESOPHAGOGASTRODUODENOSCOPY (EGD) WITH PROPOFOL N/A 12/20/2015   Procedure: ESOPHAGOGASTRODUODENOSCOPY (EGD) WITH PROPOFOL;  Surgeon: Hulen Luster, MD;  Location: [facility] ENDOSCOPY;  Service: Gastroenterology;  Laterality: N/A;  .  JOINT REPLACEMENT Left 01/2015   knee  . TONSILLECTOMY      FAMILY HISTORY :   Family History  Problem Relation Age of Onset  . Cancer Other        breast cancer, colon cancer, lymphoma, ovarian cancer  . Diabetes Other   . Anemia Other   . CAD Other   . Liver disease Other    . Breast cancer Daughter 103  . Lymphoma Mother   . Pancreatic cancer Father   . Liver cancer Father   . Breast cancer Daughter 33    SOCIAL HISTORY:   Social History   Tobacco Use  . Smoking status: Former Smoker    Packs/day: 1.00    Years: 20.00    Pack years: 20.00    Last attempt to quit: 01/27/1994    Years since quitting: 24.7  . Smokeless tobacco: Never Used  Substance Use Topics  . Alcohol use: No    Alcohol/week: 0.0 standard drinks  . Drug use: No    ALLERGIES:  is allergic to metoclopramide; percocet [oxycodone-acetaminophen]; and sulfa antibiotics.  MEDICATIONS:  Current Outpatient Medications  Medication Sig Dispense Refill  . ALPRAZolam (XANAX) 0.25 MG tablet Take 0.25 mg by mouth at bedtime as needed for anxiety.    Marland Kitchen amitriptyline (ELAVIL) 25 MG tablet Take 25 mg by mouth as needed (for head aches).     Marland Kitchen aspirin 81 MG tablet Take 81 mg by mouth every morning. Reported on 02/06/2016    . fluticasone (FLONASE) 50 MCG/ACT nasal spray Place 1 spray into both nostrils daily.     . Ibrutinib 420 MG TABS Take 420 mg by mouth daily. 28 tablet 3  . levothyroxine (SYNTHROID, LEVOTHROID) 100 MCG tablet Take 88 mcg by mouth. Patient taking on Saturday and Sunday only    . levothyroxine (SYNTHROID, LEVOTHROID) 112 MCG tablet Take by mouth.    . meclizine (ANTIVERT) 25 MG tablet Take 25 mg by mouth as needed.    Marland Kitchen omeprazole (PRILOSEC) 20 MG capsule Take 20 mg by mouth every morning.     . propranolol (INDERAL) 20 MG tablet Take by mouth.    . escitalopram (LEXAPRO) 10 MG tablet Take 1 tablet (10 mg total) by mouth daily. 30 tablet 3  . magic mouthwash SOLN Take 5 mLs by mouth 3 (three) times daily. 200 mL 3   No current facility-administered medications for this visit.     PHYSICAL EXAMINATION: ECOG PERFORMANCE STATUS: 1 - Symptomatic but completely ambulatory  BP (!) 169/88 (BP Location: Left Arm, Patient Position: Sitting)   Pulse (!) 59   Temp (!) 97.1 F (36.2  C) (Tympanic)   Resp 16   Wt 195 lb (88.5 kg)   BMI 33.47 kg/m   Filed Weights   10/29/18 1030  Weight: 195 lb (88.5 kg)    Physical Exam  Constitutional: She is oriented to person, place, and time and well-developed, well-nourished, and in no distress.  She is walking herself.  Alone.    HENT:  Head: Normocephalic and atraumatic.  Mouth/Throat: Oropharynx is clear and moist. No oropharyngeal exudate.  Oral ulcers noted  Eyes: Pupils are equal, round, and reactive to light.  Neck: Normal range of motion. Neck supple.  Cardiovascular: Normal rate and regular rhythm.  Pulmonary/Chest: No respiratory distress. She has no wheezes.  Abdominal: Soft. Bowel sounds are normal. She exhibits no distension and no mass. There is no tenderness. There is no rebound and no guarding.  Musculoskeletal: Normal  range of motion. She exhibits no edema or tenderness.  Neurological: She is alert and oriented to person, place, and time.  Skin: Skin is warm.  Psychiatric: Affect normal.      LABORATORY DATA:  I have reviewed the data as listed    Component Value Date/Time   NA 136 10/29/2018 1005   NA 139 01/08/2015 1058   K 4.7 10/29/2018 1005   K 4.3 01/08/2015 1058   CL 105 10/29/2018 1005   CL 107 01/08/2015 1058   CO2 26 10/29/2018 1005   CO2 26 01/08/2015 1058   GLUCOSE 88 10/29/2018 1005   GLUCOSE 96 01/08/2015 1058   BUN 17 10/29/2018 1005   BUN 11 01/08/2015 1058   CREATININE 0.71 10/29/2018 1005   CREATININE 0.63 01/08/2015 1058   CALCIUM 9.2 10/29/2018 1005   CALCIUM 9.3 01/08/2015 1058   PROT 6.2 (L) 10/29/2018 1005   PROT 7.0 07/11/2014 1010   ALBUMIN 3.6 10/29/2018 1005   ALBUMIN 3.6 07/11/2014 1010   AST 21 10/29/2018 1005   AST 23 07/11/2014 1010   ALT 17 10/29/2018 1005   ALT 25 07/11/2014 1010   ALKPHOS 72 10/29/2018 1005   ALKPHOS 84 07/11/2014 1010   BILITOT 0.7 10/29/2018 1005   BILITOT 0.4 07/11/2014 1010   GFRNONAA >60 10/29/2018 1005   GFRNONAA >60  01/08/2015 1058   GFRNONAA >60 07/11/2014 1010   GFRAA >60 10/29/2018 1005   GFRAA >60 01/08/2015 1058   GFRAA >60 07/11/2014 1010    No results found for: SPEP, UPEP  Lab Results  Component Value Date   WBC 9.6 10/29/2018   NEUTROABS 4.4 10/29/2018   HGB 13.2 10/29/2018   HCT 40.2 10/29/2018   MCV 95.7 10/29/2018   PLT 200 10/29/2018      Chemistry      Component Value Date/Time   NA 136 10/29/2018 1005   NA 139 01/08/2015 1058   K 4.7 10/29/2018 1005   K 4.3 01/08/2015 1058   CL 105 10/29/2018 1005   CL 107 01/08/2015 1058   CO2 26 10/29/2018 1005   CO2 26 01/08/2015 1058   BUN 17 10/29/2018 1005   BUN 11 01/08/2015 1058   CREATININE 0.71 10/29/2018 1005   CREATININE 0.63 01/08/2015 1058      Component Value Date/Time   CALCIUM 9.2 10/29/2018 1005   CALCIUM 9.3 01/08/2015 1058   ALKPHOS 72 10/29/2018 1005   ALKPHOS 84 07/11/2014 1010   AST 21 10/29/2018 1005   AST 23 07/11/2014 1010   ALT 17 10/29/2018 1005   ALT 25 07/11/2014 1010   BILITOT 0.7 10/29/2018 1005   BILITOT 0.4 07/11/2014 1010       RADIOGRAPHIC STUDIES: I have personally reviewed the radiological images as listed and agreed with the findings in the report. No results found.   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  SUV 4.5.   #Patient on Imbruvica for the last 1 month.  Tolerating poorly.  Recommend stopping Imbruvica.  Labs today normal.  #I had a long discussion with the patient regarding my concern for lymphoma causing her symptoms worse low to begin with.  However since she is not tolerating well I would recommend stopping ibrutinib.  #Given the myriad of complaints-discussed regarding depression.  Patient became tearful.  Denies any self-harm.  Patient on amitriptyline for headaches as  needed.  Recommend stopping it.  Recommend Lexapro.  Started 10 mg a day.  Will ramp up to 20 at next visit.  # Itrarogenic hyperthrydiosm/hypothyroidism-monitor closely by PCP Synthroid-  #Oral ulcers mucositis question from ibrutinib.  Recommend Magic mouthwash.  Stop ibrutinib.  # DISPOSITION: magic mouth wash-  # follow up in 4 weeks; NO labs- Dr.B    No orders of the defined types were placed in this encounter.  All questions were answered. The patient knows to call the clinic with any problems, questions or concerns.      Cammie Sickle, MD 10/29/2018 11:19 AM

## 2018-11-26 ENCOUNTER — Inpatient Hospital Stay: Payer: Medicare Other | Attending: Internal Medicine | Admitting: Internal Medicine

## 2018-11-26 ENCOUNTER — Encounter: Payer: Self-pay | Admitting: Internal Medicine

## 2018-11-26 VITALS — BP 121/81 | HR 65 | Temp 97.6°F | Resp 16 | Wt 196.2 lb

## 2018-11-26 DIAGNOSIS — Z800 Family history of malignant neoplasm of digestive organs: Secondary | ICD-10-CM | POA: Insufficient documentation

## 2018-11-26 DIAGNOSIS — K121 Other forms of stomatitis: Secondary | ICD-10-CM

## 2018-11-26 DIAGNOSIS — F419 Anxiety disorder, unspecified: Secondary | ICD-10-CM | POA: Diagnosis not present

## 2018-11-26 DIAGNOSIS — F329 Major depressive disorder, single episode, unspecified: Secondary | ICD-10-CM | POA: Diagnosis not present

## 2018-11-26 DIAGNOSIS — C8588 Other specified types of non-Hodgkin lymphoma, lymph nodes of multiple sites: Secondary | ICD-10-CM

## 2018-11-26 DIAGNOSIS — M2550 Pain in unspecified joint: Secondary | ICD-10-CM | POA: Insufficient documentation

## 2018-11-26 DIAGNOSIS — Z803 Family history of malignant neoplasm of breast: Secondary | ICD-10-CM

## 2018-11-26 DIAGNOSIS — Z87891 Personal history of nicotine dependence: Secondary | ICD-10-CM | POA: Diagnosis not present

## 2018-11-26 DIAGNOSIS — Z79899 Other long term (current) drug therapy: Secondary | ICD-10-CM | POA: Insufficient documentation

## 2018-11-26 DIAGNOSIS — Z809 Family history of malignant neoplasm, unspecified: Secondary | ICD-10-CM

## 2018-11-26 DIAGNOSIS — E039 Hypothyroidism, unspecified: Secondary | ICD-10-CM | POA: Insufficient documentation

## 2018-11-26 DIAGNOSIS — Z9221 Personal history of antineoplastic chemotherapy: Secondary | ICD-10-CM

## 2018-11-26 DIAGNOSIS — Z7982 Long term (current) use of aspirin: Secondary | ICD-10-CM | POA: Insufficient documentation

## 2018-11-26 DIAGNOSIS — Z87442 Personal history of urinary calculi: Secondary | ICD-10-CM | POA: Diagnosis not present

## 2018-11-26 DIAGNOSIS — I10 Essential (primary) hypertension: Secondary | ICD-10-CM | POA: Diagnosis not present

## 2018-11-26 DIAGNOSIS — K219 Gastro-esophageal reflux disease without esophagitis: Secondary | ICD-10-CM | POA: Insufficient documentation

## 2018-11-26 NOTE — Assessment & Plan Note (Addendum)
#   Marginal Zone lymphoma-stage IV; PET scan  OCT 2019--Stable axillary abdominal pelvic lymph nodes; largest pelvic lymph nodes approximately 2.5 cm in size.  SUV 4.5.  #Clinically asymptomatic.  Poor tolerance to ibrutinib; stopped currently.  Patient will bring her ibrutinib medications/unused.  Continue surveillance; off treatment.  # Depression-currently on Celexa 10 mg a day.  Significant improvement.  Continue the same.  Will call for refills.  # Itrarogenic hyperthrydiosm/hypothyroidism-monitor closely by PCP Synthroid-  #Oral ulcers mucositis question from ibrutinib- improved.   # DISPOSITION:  # follow up in 4 months; cbc/cmp/ldh- Dr.B

## 2018-11-26 NOTE — Progress Notes (Signed)
[place] OFFICE PROGRESS NOTE  Patient Care Team: Tracie Harrier, MD as PCP - General (Internal Medicine) Benjaman Kindler, MD as Consulting Physician (Obstetrics and Gynecology) Clent Jacks, RN as Registered Nurse  Cancer Staging No matching staging information was found for the patient.   Oncology History   # MAY 2015- Diagnosis of   low-grade B-cell lymphoma marginal zone.  From the left axillary lymph node biopsy.  Patient had a weekly rituximab therapy in May 2015  # April 2017-   Progressive disease with enlargement of lymphadenopathy on a subsequent CT scan;APril 2017- Rituxan weekly x4  # October 2019- PET-STABLE; but symtomatic [extreme fatigue]; Ibrutinib 420 mg/day- 1 month; stopped nov 22nd 2019 [intol]  #November 2019-depression-Celexa  #  Right adnexal mass and followed by Dr. Retia Passe for 2 years]; monitored.   ----------------------------------     DIAGNOSIS: [ ]  Marginal zone lymphoma  STAGE: 4  ;GOALS: Control  CURRENT/MOST RECENT THERAPY: Stopped ibrutinib.       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  female pleasant patient above history of marginal zone lymphoma currently on surveillance is here for follow-up.  Patient was taken off Imbruvica because of intolerance.   Patient started on Celexa approximately month ago.  She has noted significant provement of her energy levels.  She also had improved night sweats.  Chronic joint pains.  Review of Systems  Constitutional: Positive for malaise/fatigue. Negative for chills, diaphoresis, fever and weight loss.  HENT: Negative for nosebleeds and sore throat.   Eyes: Negative for double vision.  Respiratory: Negative for cough, hemoptysis, sputum production, shortness of breath and wheezing.   Cardiovascular: Negative for chest pain, orthopnea and leg swelling.  Gastrointestinal: Negative  for abdominal pain, blood in stool, constipation, diarrhea, heartburn, melena, nausea and vomiting.  Genitourinary: Negative for dysuria, frequency and urgency.  Musculoskeletal: Positive for back pain and joint pain.  Skin: Negative.  Negative for itching and rash.  Neurological: Negative for dizziness, tingling, focal weakness, weakness and headaches.  Endo/Heme/Allergies: Does not bruise/bleed easily.  Psychiatric/Behavioral: Negative for depression. The patient is not nervous/anxious and does not have insomnia.       PAST MEDICAL HISTORY :  Past Medical History:  Diagnosis Date  . Anxiety   . GERD (gastroesophageal reflux disease)   . History of appendectomy    30 plus yrs ago  . Hypertension   . Hypothyroidism   . Kidney stones   . Non Hodgkin's lymphoma ([place]) 2015  . Personal history of chemotherapy   . S/P partial hysterectomy   . Thyroid disease    hypothyroidism    PAST SURGICAL HISTORY :   Past Surgical History:  Procedure Laterality Date  . ABDOMINAL HYSTERECTOMY    . APPENDECTOMY    . AXILLARY LYMPH NODE BIOPSY Left 2015  . BREAST BIOPSY Right 07/15/1995   stereotactic [facility] at [facility]- neg  . CARPAL TUNNEL RELEASE    . CERVICAL SPINE SURGERY    . COLONOSCOPY WITH PROPOFOL N/A 12/20/2015   Procedure: COLONOSCOPY WITH PROPOFOL;  Surgeon: Hulen Luster, MD;  Location: [facility] ENDOSCOPY;  Service: Gastroenterology;  Laterality: N/A;  . ESOPHAGOGASTRODUODENOSCOPY (EGD) WITH PROPOFOL N/A 12/20/2015   Procedure: ESOPHAGOGASTRODUODENOSCOPY (EGD) WITH PROPOFOL;  Surgeon: Hulen Luster, MD;  Location: [facility] ENDOSCOPY;  Service: Gastroenterology;  Laterality: N/A;  . JOINT REPLACEMENT Left 01/2015   knee  . TONSILLECTOMY  FAMILY HISTORY :   Family History  Problem Relation Age of Onset  . Cancer Other        breast cancer, colon cancer, lymphoma, ovarian cancer  . Diabetes Other   . Anemia Other   . CAD Other   . Liver disease Other   . Breast cancer Daughter 85  . Lymphoma  Mother   . Pancreatic cancer Father   . Liver cancer Father   . Breast cancer Daughter 29    SOCIAL HISTORY:   Social History   Tobacco Use  . Smoking status: Former Smoker    Packs/day: 1.00    Years: 20.00    Pack years: 20.00    Last attempt to quit: 01/27/1994    Years since quitting: 24.8  . Smokeless tobacco: Never Used  Substance Use Topics  . Alcohol use: No    Alcohol/week: 0.0 standard drinks  . Drug use: No    ALLERGIES:  is allergic to metoclopramide; percocet [oxycodone-acetaminophen]; and sulfa antibiotics.  MEDICATIONS:  Current Outpatient Medications  Medication Sig Dispense Refill  . ALPRAZolam (XANAX) 0.25 MG tablet Take 0.25 mg by mouth at bedtime as needed for anxiety.    Marland Kitchen amitriptyline (ELAVIL) 25 MG tablet Take 25 mg by mouth as needed (for head aches).     Marland Kitchen aspirin 81 MG tablet Take 81 mg by mouth every morning. Reported on 02/06/2016    . escitalopram (LEXAPRO) 10 MG tablet Take 1 tablet (10 mg total) by mouth daily. 30 tablet 3  . fluticasone (FLONASE) 50 MCG/ACT nasal spray Place 1 spray into both nostrils daily.     . Ibrutinib 420 MG TABS Take 420 mg by mouth daily. 28 tablet 3  . levothyroxine (SYNTHROID, LEVOTHROID) 100 MCG tablet Take 88 mcg by mouth. Patient taking on Saturday and Sunday only    . levothyroxine (SYNTHROID, LEVOTHROID) 112 MCG tablet Take by mouth.    . magic mouthwash SOLN Take 5 mLs by mouth 3 (three) times daily. 200 mL 3  . meclizine (ANTIVERT) 25 MG tablet Take 25 mg by mouth as needed.    Marland Kitchen omeprazole (PRILOSEC) 20 MG capsule Take 20 mg by mouth every morning.     . propranolol (INDERAL) 20 MG tablet Take by mouth.     No current facility-administered medications for this visit.     PHYSICAL EXAMINATION: ECOG PERFORMANCE STATUS: 1 - Symptomatic but completely ambulatory  BP 121/81 (BP Location: Left Arm, Patient Position: Sitting, Cuff Size: Normal)   Pulse 65   Temp 97.6 F (36.4 C) (Tympanic)   Resp 16   Wt 196  lb 3.2 oz (89 kg)   BMI 33.68 kg/m   Filed Weights   11/26/18 1050  Weight: 196 lb 3.2 oz (89 kg)    Physical Exam  Constitutional: She is oriented to person, place, and time and well-developed, well-nourished, and in no distress.  She is walking herself.  Alone.    HENT:  Head: Normocephalic and atraumatic.  Mouth/Throat: Oropharynx is clear and moist. No oropharyngeal exudate.  Oral ulcers noted  Eyes: Pupils are equal, round, and reactive to light.  Neck: Normal range of motion. Neck supple.  Cardiovascular: Normal rate and regular rhythm.  Pulmonary/Chest: No respiratory distress. She has no wheezes.  Abdominal: Soft. Bowel sounds are normal. She exhibits no distension and no mass. There is no abdominal tenderness. There is no rebound and no guarding.  Musculoskeletal: Normal range of motion.  General: No tenderness or edema.  Neurological: She is alert and oriented to person, place, and time.  Skin: Skin is warm.  Psychiatric: Affect normal.      LABORATORY DATA:  I have reviewed the data as listed    Component Value Date/Time   NA 136 10/29/2018 1005   NA 139 01/08/2015 1058   K 4.7 10/29/2018 1005   K 4.3 01/08/2015 1058   CL 105 10/29/2018 1005   CL 107 01/08/2015 1058   CO2 26 10/29/2018 1005   CO2 26 01/08/2015 1058   GLUCOSE 88 10/29/2018 1005   GLUCOSE 96 01/08/2015 1058   BUN 17 10/29/2018 1005   BUN 11 01/08/2015 1058   CREATININE 0.71 10/29/2018 1005   CREATININE 0.63 01/08/2015 1058   CALCIUM 9.2 10/29/2018 1005   CALCIUM 9.3 01/08/2015 1058   PROT 6.2 (L) 10/29/2018 1005   PROT 7.0 07/11/2014 1010   ALBUMIN 3.6 10/29/2018 1005   ALBUMIN 3.6 07/11/2014 1010   AST 21 10/29/2018 1005   AST 23 07/11/2014 1010   ALT 17 10/29/2018 1005   ALT 25 07/11/2014 1010   ALKPHOS 72 10/29/2018 1005   ALKPHOS 84 07/11/2014 1010   BILITOT 0.7 10/29/2018 1005   BILITOT 0.4 07/11/2014 1010   GFRNONAA >60 10/29/2018 1005   GFRNONAA >60 01/08/2015 1058    GFRNONAA >60 07/11/2014 1010   GFRAA >60 10/29/2018 1005   GFRAA >60 01/08/2015 1058   GFRAA >60 07/11/2014 1010    No results found for: SPEP, UPEP  Lab Results  Component Value Date   WBC 9.6 10/29/2018   NEUTROABS 4.4 10/29/2018   HGB 13.2 10/29/2018   HCT 40.2 10/29/2018   MCV 95.7 10/29/2018   PLT 200 10/29/2018      Chemistry      Component Value Date/Time   NA 136 10/29/2018 1005   NA 139 01/08/2015 1058   K 4.7 10/29/2018 1005   K 4.3 01/08/2015 1058   CL 105 10/29/2018 1005   CL 107 01/08/2015 1058   CO2 26 10/29/2018 1005   CO2 26 01/08/2015 1058   BUN 17 10/29/2018 1005   BUN 11 01/08/2015 1058   CREATININE 0.71 10/29/2018 1005   CREATININE 0.63 01/08/2015 1058      Component Value Date/Time   CALCIUM 9.2 10/29/2018 1005   CALCIUM 9.3 01/08/2015 1058   ALKPHOS 72 10/29/2018 1005   ALKPHOS 84 07/11/2014 1010   AST 21 10/29/2018 1005   AST 23 07/11/2014 1010   ALT 17 10/29/2018 1005   ALT 25 07/11/2014 1010   BILITOT 0.7 10/29/2018 1005   BILITOT 0.4 07/11/2014 1010       RADIOGRAPHIC STUDIES: I have personally reviewed the radiological images as listed and agreed with the findings in the report. No results found.   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  SUV 4.5.  #Clinically asymptomatic.  Poor tolerance to ibrutinib; stopped currently.  Patient will bring her ibrutinib medications/unused.  Continue surveillance; off treatment.  # Depression-currently on Celexa 10 mg a day.  Significant improvement.  Continue the same.  Will call for refills.  # Itrarogenic hyperthrydiosm/hypothyroidism-monitor closely by PCP Synthroid-  #Oral ulcers mucositis question from ibrutinib- improved.   # DISPOSITION:  # follow up in 4 months; cbc/cmp/ldh-  Dr.B  Orders Placed This Encounter  Procedures  . CBC with Differential/Platelet    Standing Status:   Future    Standing Expiration Date:   11/27/2019  . Comprehensive metabolic panel    Standing Status:   Future    Standing Expiration Date:   11/27/2019  . Lactate dehydrogenase    Standing Status:   Future    Standing Expiration Date:   11/27/2019   All questions were answered. The patient knows to call the clinic with any problems, questions or concerns.      Cammie Sickle, MD 11/28/2018 6:24 PM

## 2018-12-27 ENCOUNTER — Encounter: Payer: Self-pay | Admitting: Podiatry

## 2018-12-27 ENCOUNTER — Ambulatory Visit (INDEPENDENT_AMBULATORY_CARE_PROVIDER_SITE_OTHER): Payer: Medicare Other | Admitting: Podiatry

## 2018-12-27 DIAGNOSIS — M7661 Achilles tendinitis, right leg: Secondary | ICD-10-CM | POA: Diagnosis not present

## 2018-12-27 MED ORDER — PREGABALIN 50 MG PO CAPS: 50.0000 mg | ORAL_CAPSULE | Freq: Two times a day (BID) | ORAL | 1 refills | Status: DC

## 2018-12-27 NOTE — Progress Notes (Signed)
She presents today for follow-up of her Achilles tendinitis states that is been hurting a little bit but is not too bad.  She is status post Achilles tendon repair left the heel is fine but I keep getting that tingling in my toes.  Objective: Vital signs stable alert and oriented x3 no significant findings on physical exam pulses are palpable neurologic sensorium appears to be intact deep tendon reflexes are intact muscle strength is normal symmetrical she does have some odd sensation on palpation of the toes and the forefoot plantarly.  Assessment: Neuropathy.  Plan: I will get her started on 50 mg of Lyrica twice daily and I will follow-up with her in 1 month

## 2019-01-24 ENCOUNTER — Ambulatory Visit: Payer: No Typology Code available for payment source | Admitting: Podiatry

## 2019-01-26 ENCOUNTER — Ambulatory Visit (INDEPENDENT_AMBULATORY_CARE_PROVIDER_SITE_OTHER): Payer: Medicare Other | Admitting: Podiatry

## 2019-01-26 ENCOUNTER — Encounter: Payer: Self-pay | Admitting: Podiatry

## 2019-01-26 DIAGNOSIS — G629 Polyneuropathy, unspecified: Secondary | ICD-10-CM | POA: Diagnosis not present

## 2019-01-26 MED ORDER — PREGABALIN 100 MG PO CAPS: 100.0000 mg | ORAL_CAPSULE | Freq: Two times a day (BID) | ORAL | 3 refills | Status: DC

## 2019-01-26 NOTE — Progress Notes (Signed)
She presents today for follow-up of her neuropathy states that she is only taking 1 pill at night 50 mg of Lyrica but cost 1 twice a day made her too dizzy and her primary care provider recommended that she stop the daytime dose.  Objective: Vital signs are stable she alert oriented x3 denies any other problems with the medication such as depression or edema.  Pulses are palpable no change otherwise.  Assessment: Neuropathy.  Resolving Achilles tendinitis.    Plan: Started her on Lyrica 100 mg twice a day we will follow-up with her in 1 month for reevaluation.

## 2019-01-31 ENCOUNTER — Other Ambulatory Visit: Payer: Self-pay | Admitting: Internal Medicine

## 2019-02-23 ENCOUNTER — Other Ambulatory Visit: Payer: Self-pay

## 2019-02-23 ENCOUNTER — Ambulatory Visit (INDEPENDENT_AMBULATORY_CARE_PROVIDER_SITE_OTHER): Payer: Medicare Other | Admitting: Podiatry

## 2019-02-23 ENCOUNTER — Encounter: Payer: Self-pay | Admitting: Podiatry

## 2019-02-23 DIAGNOSIS — M7752 Other enthesopathy of left foot: Secondary | ICD-10-CM

## 2019-02-23 DIAGNOSIS — M7751 Other enthesopathy of right foot: Secondary | ICD-10-CM

## 2019-02-23 DIAGNOSIS — G629 Polyneuropathy, unspecified: Secondary | ICD-10-CM | POA: Diagnosis not present

## 2019-02-23 NOTE — Progress Notes (Signed)
She presents today for follow-up of her neuropathy states that I feel swimmy headed with the medicine she still has pain right in here as she points to the sinus tarsi bilateral.  Objective: She has pain on palpation of the sinus tarsi bilateral.  She has pain on end range of motion of the subtalar joint pulses remain palpable and strong.  Assessment: Neuropathy bilaterally.  Painful sinus tarsitis subtalar joint capsulitis.  Plan: After sterile Betadine skin prep I injected bilateral sinus tarsi today 10 mg Kenalog 5 mg Marcaine point maximal tenderness.  Tolerated seizure well without complications.  Follow-up with her in 1 month to recheck medication.

## 2019-03-22 ENCOUNTER — Telehealth: Payer: Self-pay | Admitting: *Deleted

## 2019-03-22 NOTE — Telephone Encounter (Signed)
Left vm requesting patient to return my phone call.  Dr. B would like her visit to be a virtual visit next week.  Will wait for patient to return my phone call.

## 2019-03-23 NOTE — Telephone Encounter (Signed)
Patient returned my phone call yesterday. Agreeable to virtual visit.

## 2019-03-27 ENCOUNTER — Other Ambulatory Visit: Payer: Self-pay

## 2019-03-28 ENCOUNTER — Inpatient Hospital Stay: Payer: Medicare Other | Attending: Internal Medicine

## 2019-03-28 ENCOUNTER — Other Ambulatory Visit: Payer: Self-pay

## 2019-03-28 ENCOUNTER — Other Ambulatory Visit: Payer: Self-pay | Admitting: Internal Medicine

## 2019-03-28 ENCOUNTER — Inpatient Hospital Stay (HOSPITAL_BASED_OUTPATIENT_CLINIC_OR_DEPARTMENT_OTHER): Payer: Medicare Other | Admitting: Internal Medicine

## 2019-03-28 DIAGNOSIS — Z1231 Encounter for screening mammogram for malignant neoplasm of breast: Secondary | ICD-10-CM

## 2019-03-28 DIAGNOSIS — C8588 Other specified types of non-Hodgkin lymphoma, lymph nodes of multiple sites: Secondary | ICD-10-CM | POA: Diagnosis not present

## 2019-03-28 DIAGNOSIS — C8518 Unspecified B-cell lymphoma, lymph nodes of multiple sites: Secondary | ICD-10-CM | POA: Insufficient documentation

## 2019-03-28 LAB — CBC WITH DIFFERENTIAL/PLATELET
Abs Immature Granulocytes: 0.06 10*3/uL (ref 0.00–0.07)
Basophils Absolute: 0.1 10*3/uL (ref 0.0–0.1)
Basophils Relative: 1 %
Eosinophils Absolute: 0.2 10*3/uL (ref 0.0–0.5)
Eosinophils Relative: 3 %
HCT: 43.8 % (ref 36.0–46.0)
Hemoglobin: 14.4 g/dL (ref 12.0–15.0)
Immature Granulocytes: 1 %
Lymphocytes Relative: 44 %
Lymphs Abs: 3.2 10*3/uL (ref 0.7–4.0)
MCH: 31.1 pg (ref 26.0–34.0)
MCHC: 32.9 g/dL (ref 30.0–36.0)
MCV: 94.6 fL (ref 80.0–100.0)
Monocytes Absolute: 0.9 10*3/uL (ref 0.1–1.0)
Monocytes Relative: 12 %
Neutro Abs: 2.8 10*3/uL (ref 1.7–7.7)
Neutrophils Relative %: 39 %
Platelets: 260 10*3/uL (ref 150–400)
RBC: 4.63 MIL/uL (ref 3.87–5.11)
RDW: 13.8 % (ref 11.5–15.5)
WBC: 7.3 10*3/uL (ref 4.0–10.5)
nRBC: 0 % (ref 0.0–0.2)

## 2019-03-28 LAB — COMPREHENSIVE METABOLIC PANEL
ALT: 18 U/L (ref 0–44)
AST: 17 U/L (ref 15–41)
Albumin: 3.9 g/dL (ref 3.5–5.0)
Alkaline Phosphatase: 92 U/L (ref 38–126)
Anion gap: 8 (ref 5–15)
BUN: 16 mg/dL (ref 8–23)
CO2: 25 mmol/L (ref 22–32)
Calcium: 8.9 mg/dL (ref 8.9–10.3)
Chloride: 107 mmol/L (ref 98–111)
Creatinine, Ser: 0.8 mg/dL (ref 0.44–1.00)
GFR calc Af Amer: >60 mL/min (ref >60)
GFR calc non Af Amer: >60 mL/min (ref >60)
Glucose, Bld: 74 mg/dL (ref 70–99)
Potassium: 4.4 mmol/L (ref 3.5–5.1)
Sodium: 140 mmol/L (ref 135–145)
Total Bilirubin: 0.6 mg/dL (ref 0.3–1.2)
Total Protein: 6.6 g/dL (ref 6.5–8.1)

## 2019-03-28 LAB — LACTATE DEHYDROGENASE: LDH: 153 U/L (ref 98–192)

## 2019-03-28 NOTE — Assessment & Plan Note (Addendum)
#   Marginal Zone lymphoma-stage IV; PET scan  OCT 2019--Stable axillary abdominal pelvic lymph nodes; largest pelvic lymph nodes approximately 2.5 cm in size.  SUV 4.5.  #Clinically asymptomatic from underlying marginal zone lymphoma.  Clinically stable.  Continue surveillance.  # Depression-continue Lexapro 10 mg a day.  Improved.  # Dizziness/ fatigue- ?  Secondary to Lyrica.  Recommend talking to PCP/with Dr. regarding discontinuation.  #Neuropathy discussed regarding acupuncture instead of Lyrica as above.  # Itrarogenic hyperthrydiosm/hypothyroidism- stable.   # DISPOSITION:  # follow up in 6 months-MD; cbc/cmp/ldh- Dr.B

## 2019-03-28 NOTE — Progress Notes (Signed)
I connected with Kaitlin Thompson on 03/28/19 at 10:30 AM EDT by telephone visit and verified that I am speaking with the correct person using two identifiers.  I discussed the limitations, risks, security and privacy concerns of performing an evaluation and management service by telemedicine and the availability of in-person appointments. I also discussed with the patient that there may be a patient responsible charge related to this service. The patient expressed understanding and agreed to proceed.    Other persons participating in the visit and their role in the encounter: none   Patient's location: home  Provider's location: home    Oncology History   # MAY 2015- Diagnosis of   low-grade B-cell lymphoma marginal zone.  From the left axillary lymph node biopsy.  Patient had a weekly rituximab therapy in May 2015  # April 2017-   Progressive disease with enlargement of lymphadenopathy on a subsequent CT scan;APril 2017- Rituxan weekly x4  # October 2019- PET-STABLE; but symtomatic [extreme fatigue]; Ibrutinib 420 mg/day- 1 month; stopped nov 22nd 2019 [intol]  #November 2019-depression-Celexa  #  Right adnexal mass and followed by Dr. Retia Passe for 2 years]; monitored.   ----------------------------------     DIAGNOSIS: [ ]  Marginal zone lymphoma  STAGE: 4  ;GOALS: Control  CURRENT/MOST RECENT THERAPY: Stopped ibrutinib.       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  female with history of marginal zone lymphoma.  Patient is currently on surveillance.  Patient has been started on Lyrica by her foot doctor 2 months ago for ongoing neuropathy.  Patient noticed to have worsening dizziness and also worsening fatigue.  Patient otherwise has no night sweats or weight loss or lumps or bumps.  Observation/objective: CBC CMP normal.  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  SUV 4.5.  #Clinically asymptomatic from underlying marginal zone lymphoma.  Clinically stable.  Continue surveillance.  # Depression-continue Lexapro 10 mg a day.  Improved.  # Dizziness/ fatigue- ?  Secondary to Lyrica.  Recommend talking to PCP/with Dr. regarding discontinuation.  #Neuropathy discussed regarding acupuncture instead of Lyrica as above.  # Itrarogenic hyperthrydiosm/hypothyroidism- stable.   # DISPOSITION:  # follow up in 6 months-MD; cbc/cmp/ldh- Dr.B   Follow-up instructions:  I discussed the assessment and treatment plan with the patient.  The patient was provided an opportunity to ask questions and all were answered.  The patient agreed with the plan and demonstrated understanding of instructions.  The patient was advised to call back or seek an in person evaluation if the symptoms worsen or if the condition fails to improve as anticipated.  I provided 12 minutes of non face-to-face telephone visit time during this encounter, and > 50% was spent counseling as documented under my assessment & plan.   Dr. Charlaine Dalton [place] at [facility] 03/28/2019 11:12 AM

## 2019-03-30 ENCOUNTER — Ambulatory Visit: Payer: No Typology Code available for payment source | Admitting: Podiatry

## 2019-05-06 ENCOUNTER — Ambulatory Visit: Payer: Medicare Other

## 2019-05-20 ENCOUNTER — Ambulatory Visit
Admission: RE | Admit: 2019-05-20 | Discharge: 2019-05-20 | Disposition: A | Discharge status: Home / Self Care | Payer: Medicare Other | Source: Ambulatory Visit | Attending: Internal Medicine | Admitting: Internal Medicine

## 2019-05-20 ENCOUNTER — Other Ambulatory Visit: Payer: Self-pay

## 2019-05-20 DIAGNOSIS — Z1231 Encounter for screening mammogram for malignant neoplasm of breast: Secondary | ICD-10-CM | POA: Diagnosis not present

## 2019-06-07 ENCOUNTER — Telehealth: Payer: Medicare Other | Admitting: Cardiovascular Disease

## 2019-06-10 ENCOUNTER — Other Ambulatory Visit: Payer: Self-pay | Admitting: Internal Medicine

## 2019-07-04 ENCOUNTER — Ambulatory Visit: Payer: Medicare Other | Admitting: Cardiovascular Disease

## 2019-07-05 ENCOUNTER — Telehealth: Payer: Medicare Other | Admitting: Cardiovascular Disease

## 2019-07-15 ENCOUNTER — Other Ambulatory Visit: Payer: Self-pay | Admitting: Internal Medicine

## 2019-07-15 DIAGNOSIS — R1011 Right upper quadrant pain: Secondary | ICD-10-CM

## 2019-07-15 DIAGNOSIS — F3342 Major depressive disorder, recurrent, in full remission: Secondary | ICD-10-CM | POA: Insufficient documentation

## 2019-07-26 ENCOUNTER — Ambulatory Visit
Admission: RE | Admit: 2019-07-26 | Discharge: 2019-07-26 | Disposition: A | Discharge status: Home / Self Care | Payer: Medicare Other | Source: Ambulatory Visit | Attending: Internal Medicine | Admitting: Internal Medicine

## 2019-07-26 ENCOUNTER — Other Ambulatory Visit: Payer: Self-pay

## 2019-07-26 DIAGNOSIS — R1011 Right upper quadrant pain: Secondary | ICD-10-CM | POA: Diagnosis present

## 2019-08-01 ENCOUNTER — Other Ambulatory Visit: Payer: Self-pay | Admitting: Physical Medicine and Rehabilitation

## 2019-08-01 DIAGNOSIS — M5416 Radiculopathy, lumbar region: Secondary | ICD-10-CM

## 2019-08-02 ENCOUNTER — Other Ambulatory Visit: Payer: Self-pay

## 2019-08-02 ENCOUNTER — Ambulatory Visit
Admission: RE | Admit: 2019-08-02 | Discharge: 2019-08-02 | Disposition: A | Discharge status: Home / Self Care | Payer: Medicare Other | Source: Ambulatory Visit | Attending: Physical Medicine and Rehabilitation | Admitting: Physical Medicine and Rehabilitation

## 2019-08-02 DIAGNOSIS — M5416 Radiculopathy, lumbar region: Secondary | ICD-10-CM | POA: Insufficient documentation

## 2019-08-17 ENCOUNTER — Telehealth: Payer: Self-pay | Admitting: *Deleted

## 2019-08-17 DIAGNOSIS — C8588 Other specified types of non-Hodgkin lymphoma, lymph nodes of multiple sites: Secondary | ICD-10-CM

## 2019-08-17 NOTE — Telephone Encounter (Signed)
Patient notified of appointment. Case discussed with Dr. Rogue Bussing. Patient has chronic symptoms of tremors, night sweats and fatigue. Has occasional abdominal pain. She feels like symptoms have worsened over the past few months.

## 2019-08-17 NOTE — Telephone Encounter (Signed)
Per Josh, will sch. Pt for 930 am for labs Friday 9/11- cbc, metc, tsh and pt will be evaluated by Ander Purpura, nP

## 2019-08-17 NOTE — Telephone Encounter (Addendum)
Patient called asking to be  Evaluated by Dr Rogue Bussing because she feels something is just not right, she just does not feel well. She reports that she has no energy, her night sweats have doubled, she is not sleeping well and she is staying in bed most of the time and does not feel like she is living, just existing. She denies fever, shortness of breath or other symptoms. Please advise. Her next appointment is 09/26/19

## 2019-08-17 NOTE — Telephone Encounter (Signed)
Josh, can you touch base with the patient to determine if she needs an apt tom. With [facility]

## 2019-08-18 ENCOUNTER — Other Ambulatory Visit: Payer: Self-pay

## 2019-08-19 ENCOUNTER — Other Ambulatory Visit: Payer: Self-pay

## 2019-08-19 ENCOUNTER — Inpatient Hospital Stay (HOSPITAL_BASED_OUTPATIENT_CLINIC_OR_DEPARTMENT_OTHER): Payer: Medicare Other | Admitting: Nurse Practitioner

## 2019-08-19 ENCOUNTER — Inpatient Hospital Stay: Payer: Medicare Other | Attending: Internal Medicine

## 2019-08-19 ENCOUNTER — Encounter: Payer: Self-pay | Admitting: Nurse Practitioner

## 2019-08-19 VITALS — BP 186/92 | HR 80 | Temp 96.9°F | Resp 16 | Wt 224.0 lb

## 2019-08-19 DIAGNOSIS — E039 Hypothyroidism, unspecified: Secondary | ICD-10-CM | POA: Insufficient documentation

## 2019-08-19 DIAGNOSIS — R61 Generalized hyperhidrosis: Secondary | ICD-10-CM

## 2019-08-19 DIAGNOSIS — C8588 Other specified types of non-Hodgkin lymphoma, lymph nodes of multiple sites: Secondary | ICD-10-CM | POA: Diagnosis not present

## 2019-08-19 DIAGNOSIS — Z79899 Other long term (current) drug therapy: Secondary | ICD-10-CM | POA: Diagnosis not present

## 2019-08-19 DIAGNOSIS — I10 Essential (primary) hypertension: Secondary | ICD-10-CM | POA: Diagnosis not present

## 2019-08-19 DIAGNOSIS — Z7982 Long term (current) use of aspirin: Secondary | ICD-10-CM | POA: Insufficient documentation

## 2019-08-19 DIAGNOSIS — Z800 Family history of malignant neoplasm of digestive organs: Secondary | ICD-10-CM | POA: Diagnosis not present

## 2019-08-19 DIAGNOSIS — Z803 Family history of malignant neoplasm of breast: Secondary | ICD-10-CM | POA: Diagnosis not present

## 2019-08-19 DIAGNOSIS — R5383 Other fatigue: Secondary | ICD-10-CM | POA: Diagnosis not present

## 2019-08-19 DIAGNOSIS — Z8572 Personal history of non-Hodgkin lymphomas: Secondary | ICD-10-CM | POA: Diagnosis present

## 2019-08-19 DIAGNOSIS — R1011 Right upper quadrant pain: Secondary | ICD-10-CM | POA: Diagnosis not present

## 2019-08-19 DIAGNOSIS — R634 Abnormal weight loss: Secondary | ICD-10-CM | POA: Insufficient documentation

## 2019-08-19 DIAGNOSIS — K219 Gastro-esophageal reflux disease without esophagitis: Secondary | ICD-10-CM | POA: Diagnosis not present

## 2019-08-19 DIAGNOSIS — F329 Major depressive disorder, single episode, unspecified: Secondary | ICD-10-CM | POA: Insufficient documentation

## 2019-08-19 DIAGNOSIS — F419 Anxiety disorder, unspecified: Secondary | ICD-10-CM | POA: Insufficient documentation

## 2019-08-19 DIAGNOSIS — R42 Dizziness and giddiness: Secondary | ICD-10-CM | POA: Diagnosis not present

## 2019-08-19 LAB — COMPREHENSIVE METABOLIC PANEL
ALT: 16 U/L (ref 0–44)
AST: 17 U/L (ref 15–41)
Albumin: 4.1 g/dL (ref 3.5–5.0)
Alkaline Phosphatase: 62 U/L (ref 38–126)
Anion gap: 9 (ref 5–15)
BUN: 14 mg/dL (ref 8–23)
CO2: 26 mmol/L (ref 22–32)
Calcium: 9.2 mg/dL (ref 8.9–10.3)
Chloride: 105 mmol/L (ref 98–111)
Creatinine, Ser: 0.65 mg/dL (ref 0.44–1.00)
GFR calc Af Amer: >60 mL/min (ref >60)
GFR calc non Af Amer: >60 mL/min (ref >60)
Glucose, Bld: 107 mg/dL — ABNORMAL HIGH (ref 70–99)
Potassium: 4.4 mmol/L (ref 3.5–5.1)
Sodium: 140 mmol/L (ref 135–145)
Total Bilirubin: 0.9 mg/dL (ref 0.3–1.2)
Total Protein: 6.4 g/dL — ABNORMAL LOW (ref 6.5–8.1)

## 2019-08-19 LAB — CBC WITH DIFFERENTIAL/PLATELET
Abs Immature Granulocytes: 0.02 10*3/uL (ref 0.00–0.07)
Basophils Absolute: 0.1 10*3/uL (ref 0.0–0.1)
Basophils Relative: 1 %
Eosinophils Absolute: 0.2 10*3/uL (ref 0.0–0.5)
Eosinophils Relative: 3 %
HCT: 41.4 % (ref 36.0–46.0)
Hemoglobin: 13.7 g/dL (ref 12.0–15.0)
Immature Granulocytes: 0 %
Lymphocytes Relative: 51 %
Lymphs Abs: 3.1 10*3/uL (ref 0.7–4.0)
MCH: 31.4 pg (ref 26.0–34.0)
MCHC: 33.1 g/dL (ref 30.0–36.0)
MCV: 95 fL (ref 80.0–100.0)
Monocytes Absolute: 0.8 10*3/uL (ref 0.1–1.0)
Monocytes Relative: 13 %
Neutro Abs: 1.9 10*3/uL (ref 1.7–7.7)
Neutrophils Relative %: 32 %
Platelets: 255 10*3/uL (ref 150–400)
RBC: 4.36 MIL/uL (ref 3.87–5.11)
RDW: 13.3 % (ref 11.5–15.5)
WBC: 6.1 10*3/uL (ref 4.0–10.5)
nRBC: 0 % (ref 0.0–0.2)

## 2019-08-19 LAB — TSH: TSH: 1.01 u[IU]/mL (ref 0.350–4.500)

## 2019-08-19 NOTE — Progress Notes (Signed)
Symptom Management [place]  Telephone:([phone] [phone]  Patient Care Team: Tracie Harrier, MD as PCP - General (Internal Medicine) Benjaman Kindler, MD as Consulting Physician (Obstetrics and Gynecology) Clent Jacks, RN as Registered Nurse   Name of the patient: Kaitlin Thompson  ON:[id number]  05-27-1951   Date of visit: 08/19/19  Diagnosis-low-grade B-cell lymphoma marginal zone  Chief complaint/ Reason for visit- Sweats & fatigue  Heme/Onc history:  Oncology History Overview Note  # MAY 2015- Diagnosis of   low-grade B-cell lymphoma marginal zone.  From the left axillary lymph node biopsy.  Patient had a weekly rituximab therapy in May 2015  # April 2017-   Progressive disease with enlargement of lymphadenopathy on a subsequent CT scan;APril 2017- Rituxan weekly x4  # October 2019- PET-STABLE; but symtomatic [extreme fatigue]; Ibrutinib 420 mg/day- 1 month; stopped nov 22nd 2019 [intol]  #November 2019-depression-Celexa  #  Right adnexal mass and followed by Dr. Retia Passe for 2 years]; monitored.   ----------------------------------     DIAGNOSIS: [ ]  Marginal zone lymphoma  STAGE: 4  ;GOALS: Control  CURRENT/MOST RECENT THERAPY: Stopped ibrutinib.     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  Says they have been occurring somewhat more frequently in the past few months.  Has chronic right upper quadrant pain which is been attributed to gallbladder in the past.  No worse.  No vaginal bleeding.  Does have chronic vaginal dryness.  Has chronic urge incontinence with history of overactive bladder.  Says she feels cold at times.  Reports depression.   Chronically fatigued.  Intermittent dizziness.  History of hysterectomy due to endometriosis.  She has gained approximately 30 pounds since March.  She does not exercise and has not been eating balanced diet since COVID.  She is retired.  No new lumps or bumps.  No fevers.  Says she has chronic knee and back pain and anticipate surgery in the future.  No chest pain, nausea, vomiting, constipation, diarrhea.  Denies other urinary complaints.  Denies neurologic complaints.  Denies easy bleeding or bruising.  She says she is concerned that her lymphoma is progressing.  She says she lost her mother to lymphoma.  Dad had colon and pancreatic cancer.  History of breast cancer in 2 daughters.  She was last seen by Dr. Rogue Bussing in April with labs at that time. LDH was stable at 153 (normal), cbc & cmp were normal.  Last PET scan was 09/09/2018 which showed mildly increased activity in the pathologic axillary left pelvic lymph nodes.  Left external iliac lymph node was minimally enlarged with maximum SUV of 4.5, [place] 4.  She had ultrasound of right upper quadrant of abdomen in August which did not show definitive abnormalities.   ECOG FS:1 - Symptomatic but completely ambulatory  Review of systems- Review of Systems  Constitutional: Positive for chills and malaise/fatigue. Negative for diaphoresis, fever and weight loss.  HENT: Negative for congestion, ear pain, nosebleeds, sinus pain, sore throat and tinnitus.   Eyes: Negative for blurred vision and redness.  Respiratory: Positive for shortness of breath (with exertion). Negative for cough, hemoptysis, sputum production and wheezing.   Cardiovascular: Negative for chest pain, palpitations, orthopnea, claudication and leg swelling.  Gastrointestinal: Positive for  abdominal pain (ruq- chronic no worse). Negative for blood in stool, constipation, diarrhea, heartburn, melena and nausea.  Genitourinary: Positive for urgency (hx of overactive bladder).  Negative for dysuria, flank pain, frequency and hematuria.  Musculoskeletal: Positive for back pain and joint pain. Negative for falls, myalgias and neck pain.  Skin: Negative for itching and rash.  Neurological: Negative for dizziness, sensory change, weakness and headaches.  Endo/Heme/Allergies: Does not bruise/bleed easily.  Psychiatric/Behavioral: Positive for depression. The patient is nervous/anxious.      Current treatment-surveillance  Allergies  Allergen Reactions   Metoclopramide Other (See Comments)    Tremors   Percocet [Oxycodone-Acetaminophen]    Sulfa Antibiotics     Other reaction(s): Unknown Patient states she is not allergic to this medication.  Allergies reviewed.  Past Medical History:  Diagnosis Date   Anxiety    GERD (gastroesophageal reflux disease)    History of appendectomy    30 plus yrs ago   Hypertension    Hypothyroidism    Kidney stones    Non Hodgkin's lymphoma ([place]) 2015   Personal history of chemotherapy    S/P partial hysterectomy    Thyroid disease    hypothyroidism  No updates to medical history today  Past Surgical History:  Procedure Laterality Date   ABDOMINAL HYSTERECTOMY     APPENDECTOMY     AXILLARY LYMPH NODE BIOPSY Left 2015   BREAST BIOPSY Right 07/15/1995   stereotactic biopsy at [facility]- neg   CARPAL TUNNEL RELEASE     CERVICAL SPINE SURGERY     COLONOSCOPY WITH PROPOFOL N/A 12/20/2015   Procedure: COLONOSCOPY WITH PROPOFOL;  Surgeon: Hulen Luster, MD;  Location: [facility] ENDOSCOPY;  Service: Gastroenterology;  Laterality: N/A;   ESOPHAGOGASTRODUODENOSCOPY (EGD) WITH PROPOFOL N/A 12/20/2015   Procedure: ESOPHAGOGASTRODUODENOSCOPY (EGD) WITH PROPOFOL;  Surgeon: Hulen Luster, MD;  Location[facility] ENDOSCOPY;  Service: Gastroenterology;  Laterality: N/A;   JOINT REPLACEMENT Left 01/2015   knee   TONSILLECTOMY    No updates to surgical history today  Social History   Socioeconomic History   Marital status: Married     Spouse name: Not on file   Number of children: Not on file   Years of education: Not on file   Highest education level: Not on file  Occupational History   Not on file  Social Needs   Financial resource strain: Not on file   Food insecurity    Worry: Not on file    Inability: Not on file   Transportation needs    Medical: Not on file    Non-medical: Not on file  Tobacco Use   Smoking status: Former Smoker    Packs/day: 1.00    Years: 20.00    Pack years: 20.00    Quit date: 01/27/1994    Years since quitting: 25.5   Smokeless tobacco: Never Used  Substance and Sexual Activity   Alcohol use: No    Alcohol/week: 0.0 standard drinks   Drug use: No   Sexual activity: Yes  Lifestyle   Physical activity    Days per week: Not on file    Minutes per session: Not on file   Stress: Not on file  Relationships   Social connections    Talks on phone: Not on file    Gets together: Not on file    Attends religious service: Not on file    Active member of club or organization: Not on file    Attends meetings of clubs or organizations:  Not on file    Relationship status: Not on file   Intimate partner violence    Fear of current or ex partner: Not on file    Emotionally abused: Not on file    Physically abused: Not on file    Forced sexual activity: Not on file  Other Topics Concern   Not on file  Social History Narrative   Not on file  No updates social history today  Family History  Problem Relation Age of Onset   Cancer Other        breast cancer, colon cancer, lymphoma, ovarian cancer   Diabetes Other    Anemia Other    CAD Other    Liver disease Other    Breast cancer Daughter 61   Lymphoma Mother    Pancreatic cancer Father    Liver cancer Father    Breast cancer Daughter 46  No updates to family history today   Current Outpatient Medications:    ALPRAZolam (XANAX) 0.25 MG tablet, Take 0.25 mg by mouth at bedtime as needed for  anxiety., Disp: , Rfl:    aspirin 81 MG tablet, Take 81 mg by mouth every morning. Reported on 02/06/2016, Disp: , Rfl:    escitalopram (LEXAPRO) 10 MG tablet, TAKE 1 TABLET BY MOUTH DAILY, Disp: 30 tablet, Rfl: 3   fluticasone (FLONASE) 50 MCG/ACT nasal spray, Place 1 spray into both nostrils daily. , Disp: , Rfl:    hydrocortisone (CORTEF) 20 MG tablet, , Disp: , Rfl:    levothyroxine (SYNTHROID, LEVOTHROID) 100 MCG tablet, Take 88 mcg by mouth. Patient taking on Saturday and Sunday only, Disp: , Rfl:    levothyroxine (SYNTHROID, LEVOTHROID) 112 MCG tablet, Take by mouth., Disp: , Rfl:    levothyroxine (SYNTHROID, LEVOTHROID) 88 MCG tablet, , Disp: , Rfl:    magic mouthwash SOLN, Take 5 mLs by mouth 3 (three) times daily., Disp: 200 mL, Rfl: 3   meclizine (ANTIVERT) 25 MG tablet, Take 25 mg by mouth as needed., Disp: , Rfl:    montelukast (SINGULAIR) 10 MG tablet, , Disp: , Rfl: 0   omeprazole (PRILOSEC) 20 MG capsule, Take 20 mg by mouth every morning. , Disp: , Rfl:    pregabalin (LYRICA) 100 MG capsule, Take 1 capsule (100 mg total) by mouth 2 (two) times daily., Disp: 60 capsule, Rfl: 3   propranolol (INDERAL) 20 MG tablet, Take by mouth., Disp: , Rfl:   Physical exam:  Vitals:   08/19/19 1302  BP: (!) 186/92  Pulse: 80  Resp: 16  Temp: (!) 96.9 F (36.1 C)  SpO2: 98%  Weight: 224 lb (101.6 kg)   Physical Exam Constitutional:      General: She is not in acute distress.    Appearance: She is obese.  HENT:     Head: Normocephalic and atraumatic.     Right Ear: External ear normal.     Left Ear: External ear normal.     Nose: No congestion or rhinorrhea.     Mouth/Throat:     Mouth: Mucous membranes are moist.     Pharynx: Oropharynx is clear.  Eyes:     General: No scleral icterus.    Conjunctiva/sclera: Conjunctivae normal.  Neck:     Musculoskeletal: Neck supple.  Cardiovascular:     Rate and Rhythm: Normal rate and regular rhythm.     Pulses: Normal  pulses.     Heart sounds: Normal heart sounds.  Pulmonary:     Effort: Pulmonary  effort is normal.     Breath sounds: Normal breath sounds.  Abdominal:     Palpations: Abdomen is soft.     Tenderness: There is no abdominal tenderness.  Musculoskeletal:        General: No deformity.     Right lower leg: No edema.     Left lower leg: No edema.  Lymphadenopathy:     Cervical: No cervical adenopathy.  Skin:    General: Skin is warm and dry.     Capillary Refill: Capillary refill takes less than 2 seconds.     Findings: No rash.  Neurological:     Mental Status: She is alert and oriented to person, place, and time.     Motor: No weakness.  Psychiatric:        Attention and Perception: Attention normal.        Speech: Speech normal.        Behavior: Behavior normal.      CMP Latest Ref Rng & Units 08/19/2019  Glucose 70 - 99 mg/dL 107(H)  BUN 8 - 23 mg/dL 14  Creatinine 0.44 - 1.00 mg/dL 0.65  Sodium 135 - 145 mmol/L 140  Potassium 3.5 - 5.1 mmol/L 4.4  Chloride 98 - 111 mmol/L 105  CO2 22 - 32 mmol/L 26  Calcium 8.9 - 10.3 mg/dL 9.2  Total Protein 6.5 - 8.1 g/dL 6.4(L)  Total Bilirubin 0.3 - 1.2 mg/dL 0.9  Alkaline Phos 38 - 126 U/L 62  AST 15 - 41 U/L 17  ALT 0 - 44 U/L 16   CBC Latest Ref Rng & Units 08/19/2019  WBC 4.0 - 10.5 K/uL 6.1  Hemoglobin 12.0 - 15.0 g/dL 13.7  Hematocrit 36.0 - 46.0 % 41.4  Platelets 150 - 400 K/uL 255    No images are attached to the encounter.  Mr Lumbar Spine Wo Contrast  Result Date: 08/02/2019 CLINICAL DATA:  Chronic low back pain radiating into both legs. EXAM: MRI LUMBAR SPINE WITHOUT CONTRAST TECHNIQUE: Multiplanar, multisequence MR imaging of the lumbar spine was performed. No intravenous contrast was administered. COMPARISON:  CT abdomen pelvis dated August 18, 2017. MRI lumbar spine dated May 24, 2014. FINDINGS: Segmentation:  Standard. Alignment: Unchanged 4 mm anterolisthesis at L5-S1 due to chronic bilateral L5 pars  defects. Vertebrae: No fracture, evidence of discitis, or suspicious bone lesion. Subtle round 1.4 cm slightly T1 and T2 hypointense lesion in the L3 vertebral body is unchanged since 2015, consistent with benign etiology. Conus medullaris and cauda equina: Conus extends to the L2 level. Conus and cauda equina appear normal. Paraspinal and other soft tissues: Negative. Disc levels: T12-L1:  Unchanged minimal disc bulging.  No stenosis. L1-L2:  Negative. L2-L3: Unchanged small broad-based posterior disc protrusion. No stenosis. L3-L4: Small shallow broad-based posterior disc protrusion with interval decrease in size of right subarticular and foraminal component. Unchanged mild bilateral facet arthropathy. No stenosis. L4-L5: Unchanged small shallow broad-based posterior disc protrusion. Unchanged moderate bilateral facet arthropathy. No stenosis. L5-S1: Unchanged disc uncovering and mild disc bulging. Unchanged moderate bilateral facet arthropathy. Unchanged moderate bilateral neuroforaminal stenosis. No spinal canal stenosis. IMPRESSION: 1. Similar appearing grade 1 anterolisthesis at L5-S1 due to chronic L5 pars defects, with resultant moderate bilateral neuroforaminal stenosis. 2. Interval regression in size of right subarticular and foraminal disc protrusion at L3-L4 with resolved right lateral recess and neuroforaminal stenosis. Electronically Signed   By: Titus Dubin M.D.   On: 08/02/2019 12:43   US Abdomen Limited Ruq  Result Date:  07/26/2019 CLINICAL DATA:  RIGHT upper quadrant abdominal pain intermittently for 1 year, history of GERD, hypertension, kidney stones, non-Hodgkin's lymphoma 01/30/2009 EXAM: ULTRASOUND ABDOMEN LIMITED RIGHT UPPER QUADRANT COMPARISON:  01/30/2009 FINDINGS: Gallbladder: Normally distended without stones or wall thickening. No pericholecystic fluid or sonographic Murphy sign. Common bile duct: Diameter: 2 mm diameter, normal Liver: Upper normal parenchymal echogenicity. No  definite hepatic mass or nodularity. Portal vein is patent on color Doppler imaging with normal direction of blood flow towards the liver. Other: No free fluid in RIGHT upper quadrant. IMPRESSION: No definite upper abdominal sonographic abnormalities. Electronically Signed   By: Lavonia Dana M.D.   On: 07/26/2019 13:23    Assessment and plan- Patient is a 68 y.o. female with marginal zone lymphoma who presents to symptom management clinic for night sweats  1. Night Sweats- several vague symptoms; etiology unclear. Labs today overall reassuring. TSH normal. Discussed with Dr. Rogue Bussing who recommends PET scan for re-evaluation of lymphoma.   2. Weight gain- 30 lb weight gain since March. Lengthy Advised to start exercise program as tolerated and encouraged reduced calorie intake diet with goal of 1-2 lb/week weight loss.   3. Hypertension- blood pressure elevated in clinic today. Better on recheck. Advised patient to monitor at home and given weight gain she may need adjustment of her medications. Advised her to discuss with her primary care doctor.   4. Marginal Zone Lymphoma-stage IV.  October 2019 PET scan showed stable axillary abdominal pelvic lymph nodes with maximum SUV 4.5.  Given symptoms will get PET scan to reevaluate.  Disposition:  - pet scan & follow up with Dr. Rogue Bussing   Visit Diagnosis 1. 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 Night sweats     Patient expressed understanding and was in agreement with this plan. She also understands that She can call clinic at any time with any questions, concerns, or complaints.   Thank you for allowing me to participate in the care of this very pleasant patient.   Beckey Rutter, DNP, AGNP-C [place] [facility] [place] (work cell) [phone] (office)  CC: Dr. Rogue Bussing & Dr. Ginette Pitman

## 2019-08-22 ENCOUNTER — Encounter: Payer: Self-pay | Admitting: Nurse Practitioner

## 2019-08-26 ENCOUNTER — Ambulatory Visit: Payer: Medicare Other | Admitting: Internal Medicine

## 2019-08-30 ENCOUNTER — Other Ambulatory Visit: Payer: Self-pay

## 2019-08-30 ENCOUNTER — Encounter
Admission: RE | Admit: 2019-08-30 | Discharge: 2019-08-30 | Disposition: A | Discharge status: Home / Self Care | Payer: Medicare Other | Source: Ambulatory Visit | Attending: Nurse Practitioner | Admitting: Nurse Practitioner

## 2019-08-30 ENCOUNTER — Encounter: Payer: Self-pay | Admitting: Internal Medicine

## 2019-08-30 DIAGNOSIS — Z79899 Other long term (current) drug therapy: Secondary | ICD-10-CM | POA: Diagnosis not present

## 2019-08-30 DIAGNOSIS — I10 Essential (primary) hypertension: Secondary | ICD-10-CM | POA: Insufficient documentation

## 2019-08-30 DIAGNOSIS — C8588 Other specified types of non-Hodgkin lymphoma, lymph nodes of multiple sites: Secondary | ICD-10-CM | POA: Insufficient documentation

## 2019-08-30 DIAGNOSIS — E039 Hypothyroidism, unspecified: Secondary | ICD-10-CM | POA: Diagnosis not present

## 2019-08-30 DIAGNOSIS — Z79890 Hormone replacement therapy (postmenopausal): Secondary | ICD-10-CM | POA: Diagnosis not present

## 2019-08-30 DIAGNOSIS — Z87891 Personal history of nicotine dependence: Secondary | ICD-10-CM | POA: Insufficient documentation

## 2019-08-30 LAB — GLUCOSE, CAPILLARY: Glucose-Capillary: 98 mg/dL (ref 70–99)

## 2019-08-30 MED ORDER — FLUDEOXYGLUCOSE F - 18 (FDG) INJECTION
11.6000 | Freq: Once | INTRAVENOUS | Status: AC | PRN
  Administered 2019-08-30: 09:00:00 12.05 via INTRAVENOUS

## 2019-08-31 ENCOUNTER — Inpatient Hospital Stay (HOSPITAL_BASED_OUTPATIENT_CLINIC_OR_DEPARTMENT_OTHER): Payer: Medicare Other | Admitting: Internal Medicine

## 2019-08-31 ENCOUNTER — Other Ambulatory Visit: Payer: Self-pay

## 2019-08-31 DIAGNOSIS — Z8572 Personal history of non-Hodgkin lymphomas: Secondary | ICD-10-CM | POA: Diagnosis not present

## 2019-08-31 DIAGNOSIS — C8588 Other specified types of non-Hodgkin lymphoma, lymph nodes of multiple sites: Secondary | ICD-10-CM | POA: Diagnosis not present

## 2019-08-31 NOTE — Assessment & Plan Note (Addendum)
#   Marginal Zone lymphoma-stage IV; currently on surveillance however PET scan SEP 22nd 2020- "improved"; continue surveillance of therapy.  #Clinically patient symptoms[discussed below] are unrelated to her underlying lymphoma.  Continue surveillance for lymphoma.  # Dizziness/ fatigue/sweats- ? lexapro 10 mg-? Intolerance; patient currently off Lexapro.  Again and not sure if symptoms are related to Lexapro versus others.  Discussed regarding a referral/evaluation with neurology.  Defer to PCP for referral.  #Peripheral neuropathy-question etiology; poor tolerance to Lyrica.  Discussed acupuncture.  Again discussed evaluation with neurology.  # DISPOSITION:  # follow up in 6 months-MD; cbc/cmp/ldh- Dr.B  # 25 minutes face-to-face with the patient discussing the above plan of care; more than 50% of time spent on prognosis/ natural history; counseling and coordination.

## 2019-08-31 NOTE — Progress Notes (Signed)
[place] OFFICE PROGRESS NOTE  Patient Care Team: Tracie Harrier, MD as PCP - General (Internal Medicine) Benjaman Kindler, MD as Consulting Physician (Obstetrics and Gynecology) Clent Jacks, RN as Registered Nurse  Cancer Staging No matching staging information was found for the patient.   Oncology History Overview Note  # MAY 2015- Diagnosis of   low-grade B-cell lymphoma marginal zone.  From the left axillary lymph node biopsy.  Patient had a weekly rituximab therapy in May 2015  # April 2017-   Progressive disease with enlargement of lymphadenopathy on a subsequent CT scan;APril 2017- Rituxan weekly x4  # October 2019- PET-STABLE; but symtomatic [extreme fatigue]; Ibrutinib 420 mg/day- 1 month; stopped nov 22nd 2019 [intol]; SEP 2020- PET- " improved"  #November 2019-depression-Celexa  #  Right adnexal mass and followed by Dr. Retia Passe for 2 years]; monitored.   ----------------------------------     DIAGNOSIS: [ ]  Marginal zone lymphoma  STAGE: 4  ;GOALS: Control  CURRENT/MOST RECENT THERAPY: on surveillance.     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  female [place] history of marginal zone lymphoma currently on surveillance is here for follow-up/review results of the PET scan.  Patient has multiple complaints-including extreme fatigue.  She complains of difficulty doing her household chores.  Complains of "shortness of breath:.  No cough.  No swelling in the legs.  Episodes of drenching intermittent sweats both days and nights.  No fever chills.  She continues to complain of vague lower quadrant abdominal pain.  This is not any worse.  But just not getting any better.  Complains of episodic dizziness.  No falls.  Review of Systems  Constitutional: Positive for malaise/fatigue. Negative for chills, diaphoresis, fever and weight loss.   HENT: Negative for nosebleeds and sore throat.   Eyes: Negative for double vision.  Respiratory: Positive for shortness of breath. Negative for cough, hemoptysis, sputum production and wheezing.   Cardiovascular: Negative for chest pain, orthopnea and leg swelling.  Gastrointestinal: Positive for abdominal pain. Negative for blood in stool, constipation, diarrhea, heartburn, melena, nausea and vomiting.  Genitourinary: Negative for dysuria, frequency and urgency.  Musculoskeletal: Positive for back pain and joint pain.  Skin: Negative.  Negative for itching and rash.  Neurological: Positive for dizziness. Negative for tingling, focal weakness, weakness and headaches.  Endo/Heme/Allergies: Does not bruise/bleed easily.  Psychiatric/Behavioral: Negative for depression. The patient is not nervous/anxious and does not have insomnia.       PAST MEDICAL HISTORY :  Past Medical History:  Diagnosis Date  . Anxiety   . GERD (gastroesophageal reflux disease)   . History of appendectomy    30 plus yrs ago  . Hypertension   . Hypothyroidism   . Kidney stones   . Non Hodgkin's lymphoma ([place]) 2015  . Personal history of chemotherapy   . S/P partial hysterectomy   . Thyroid disease    hypothyroidism    PAST SURGICAL HISTORY :   Past Surgical History:  Procedure Laterality Date  . ABDOMINAL HYSTERECTOMY    . APPENDECTOMY    . AXILLARY LYMPH NODE BIOPSY Left 2015  . BREAST BIOPSY Right 07/15/1995   stereotactic biopsy at [facility]- neg  . CARPAL TUNNEL RELEASE    . CERVICAL SPINE SURGERY    . COLONOSCOPY WITH PROPOFOL N/A 12/20/2015   Procedure: COLONOSCOPY WITH PROPOFOL;  Surgeon: Hulen Luster, MD;  Location: [facility] ENDOSCOPY;  Service: Gastroenterology;  Laterality: N/A;  . ESOPHAGOGASTRODUODENOSCOPY (EGD) WITH PROPOFOL N/A 12/20/2015   Procedure: ESOPHAGOGASTRODUODENOSCOPY (EGD) WITH PROPOFOL;  Surgeon: Hulen Luster, MD;  Location[facility] ENDOSCOPY;  Service: Gastroenterology;  Laterality: N/A;  .  JOINT REPLACEMENT Left 01/2015   knee  . TONSILLECTOMY      FAMILY HISTORY :   Family History  Problem Relation Age of Onset  . Cancer Other        breast cancer, colon cancer, lymphoma, ovarian cancer  . Diabetes Other   . Anemia Other   . CAD Other   . Liver disease Other   . Breast cancer Daughter 73  . Lymphoma Mother   . Pancreatic cancer Father   . Liver cancer Father   . Breast cancer Daughter 69    SOCIAL HISTORY:   Social History   Tobacco Use  . Smoking status: Former Smoker    Packs/day: 1.00    Years: 20.00    Pack years: 20.00    Quit date: 01/27/1994    Years since quitting: 25.6  . Smokeless tobacco: Never Used  Substance Use Topics  . Alcohol use: No    Alcohol/week: 0.0 standard drinks  . Drug use: No    ALLERGIES:  is allergic to metoclopramide; percocet [oxycodone-acetaminophen]; and sulfa antibiotics.  MEDICATIONS:  Current Outpatient Medications  Medication Sig Dispense Refill  . ALPRAZolam (XANAX) 0.25 MG tablet Take 0.25 mg by mouth at bedtime as needed for anxiety.    Marland Kitchen aspirin 81 MG tablet Take 81 mg by mouth every morning. Reported on 02/06/2016    . fluticasone (FLONASE) 50 MCG/ACT nasal spray Place 1 spray into both nostrils daily.     . hydrocortisone (CORTEF) 20 MG tablet     . magic mouthwash SOLN Take 5 mLs by mouth 3 (three) times daily. 200 mL 3  . meclizine (ANTIVERT) 25 MG tablet Take 25 mg by mouth as needed.    . montelukast (SINGULAIR) 10 MG tablet   0  . omeprazole (PRILOSEC) 20 MG capsule Take 20 mg by mouth every morning.     . propranolol (INDERAL) 20 MG tablet Take by mouth.    . escitalopram (LEXAPRO) 10 MG tablet TAKE 1 TABLET BY MOUTH DAILY (Patient not taking: Reported on 08/30/2019) 30 tablet 3  . pregabalin (LYRICA) 100 MG capsule Take 1 capsule (100 mg total) by mouth 2 (two) times daily. (Patient not taking: Reported on 08/30/2019) 60 capsule 3   No current facility-administered medications for this visit.      PHYSICAL EXAMINATION: ECOG PERFORMANCE STATUS: 1 - Symptomatic but completely ambulatory  BP (!) 155/95 (BP Location: Left Arm, Patient Position: Sitting, Cuff Size: Normal)   Pulse 73   Temp (!) 97 F (36.1 C) (Tympanic)   Wt 220 lb (99.8 kg)   BMI 37.76 kg/m   Filed Weights   08/30/19 1445  Weight: 220 lb (99.8 kg)    Physical Exam  Constitutional: She is oriented to person, place, and time and well-developed, well-nourished, and in no distress.  She is walking herself.  Alone.    HENT:  Head: Normocephalic and atraumatic.  Mouth/Throat: Oropharynx is clear and moist. No oropharyngeal exudate.  Eyes: Pupils are equal, round, and reactive to light.  Neck: Normal range of motion. Neck supple.  Cardiovascular: Normal rate and regular rhythm.  Pulmonary/Chest: Effort normal and breath sounds normal. No respiratory distress. She has no wheezes.  Abdominal: Soft. Bowel sounds are normal.  She exhibits no distension and no mass. There is no abdominal tenderness. There is no rebound and no guarding.  Musculoskeletal: Normal range of motion.        General: No tenderness or edema.  Neurological: She is alert and oriented to person, place, and time.  Skin: Skin is warm.  Psychiatric: Affect normal.      LABORATORY DATA:  I have reviewed the data as listed    Component Value Date/Time   NA 140 08/19/2019 0927   NA 139 01/08/2015 1058   K 4.4 08/19/2019 0927   K 4.3 01/08/2015 1058   CL 105 08/19/2019 0927   CL 107 01/08/2015 1058   CO2 26 08/19/2019 0927   CO2 26 01/08/2015 1058   GLUCOSE 107 (H) 08/19/2019 0927   GLUCOSE 96 01/08/2015 1058   BUN 14 08/19/2019 0927   BUN 11 01/08/2015 1058   CREATININE 0.65 08/19/2019 0927   CREATININE 0.63 01/08/2015 1058   CALCIUM 9.2 08/19/2019 0927   CALCIUM 9.3 01/08/2015 1058   PROT 6.4 (L) 08/19/2019 0927   PROT 7.0 07/11/2014 1010   ALBUMIN 4.1 08/19/2019 0927   ALBUMIN 3.6 07/11/2014 1010   AST 17 08/19/2019 0927    AST 23 07/11/2014 1010   ALT 16 08/19/2019 0927   ALT 25 07/11/2014 1010   ALKPHOS 62 08/19/2019 0927   ALKPHOS 84 07/11/2014 1010   BILITOT 0.9 08/19/2019 0927   BILITOT 0.4 07/11/2014 1010   GFRNONAA >60 08/19/2019 0927   GFRNONAA >60 01/08/2015 1058   GFRNONAA >60 07/11/2014 1010   GFRAA >60 08/19/2019 0927   GFRAA >60 01/08/2015 1058   GFRAA >60 07/11/2014 1010    No results found for: SPEP, UPEP  Lab Results  Component Value Date   WBC 6.1 08/19/2019   NEUTROABS 1.9 08/19/2019   HGB 13.7 08/19/2019   HCT 41.4 08/19/2019   MCV 95.0 08/19/2019   PLT 255 08/19/2019      Chemistry      Component Value Date/Time   NA 140 08/19/2019 0927   NA 139 01/08/2015 1058   K 4.4 08/19/2019 0927   K 4.3 01/08/2015 1058   CL 105 08/19/2019 0927   CL 107 01/08/2015 1058   CO2 26 08/19/2019 0927   CO2 26 01/08/2015 1058   BUN 14 08/19/2019 0927   BUN 11 01/08/2015 1058   CREATININE 0.65 08/19/2019 0927   CREATININE 0.63 01/08/2015 1058      Component Value Date/Time   CALCIUM 9.2 08/19/2019 0927   CALCIUM 9.3 01/08/2015 1058   ALKPHOS 62 08/19/2019 0927   ALKPHOS 84 07/11/2014 1010   AST 17 08/19/2019 0927   AST 23 07/11/2014 1010   ALT 16 08/19/2019 0927   ALT 25 07/11/2014 1010   BILITOT 0.9 08/19/2019 0927   BILITOT 0.4 07/11/2014 1010       RADIOGRAPHIC STUDIES: I have personally reviewed the radiological images as listed and agreed with the findings in the report. No results found.   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  #Clinically patient symptoms[discussed below] are unrelated to her underlying lymphoma.  Continue surveillance for lymphoma.  # Dizziness/ fatigue/sweats- ? lexapro 10 mg-? Intolerance; patient currently off Lexapro.  Again and not sure if symptoms are related  to Lexapro versus others.  Discussed regarding a referral/evaluation with neurology.  Defer to PCP for referral.  #Peripheral neuropathy-question etiology; poor tolerance to Lyrica.  Discussed acupuncture.  Again discussed evaluation with neurology.  # DISPOSITION:  # follow up in 6 months-MD; cbc/cmp/ldh- Dr.B    Orders Placed This Encounter  Procedures  . CBC with Differential/Platelet    Standing Status:   Future    Standing Expiration Date:   08/30/2020  . Comprehensive metabolic panel    Standing Status:   Future    Standing Expiration Date:   08/30/2020  . Lactate dehydrogenase    Standing Status:   Future    Standing Expiration Date:   08/30/2020   All questions were answered. The patient knows to call the clinic with any problems, questions or concerns.      Cammie Sickle, MD 09/12/2019 7:17 AM

## 2019-09-22 ENCOUNTER — Telehealth: Payer: Self-pay | Admitting: *Deleted

## 2019-09-22 NOTE — Telephone Encounter (Signed)
Left vm for patient. Patient on sch. For Monday 10/19 for f/u NP visit. This apt is not needed per Dr. Rogue Bussing and can be cnl. I left a vm for patient to confirm that she is not coming to this apt.

## 2019-09-23 ENCOUNTER — Other Ambulatory Visit: Payer: Self-pay | Admitting: Internal Medicine

## 2019-09-23 DIAGNOSIS — Z6837 Body mass index (BMI) 37.0-37.9, adult: Secondary | ICD-10-CM

## 2019-09-23 DIAGNOSIS — R42 Dizziness and giddiness: Secondary | ICD-10-CM

## 2019-09-26 ENCOUNTER — Inpatient Hospital Stay: Payer: Medicare Other | Admitting: Nurse Practitioner

## 2019-09-26 ENCOUNTER — Inpatient Hospital Stay: Payer: Medicare Other

## 2019-09-27 ENCOUNTER — Emergency Department: Payer: Medicare Other

## 2019-09-27 ENCOUNTER — Encounter: Payer: Self-pay | Admitting: Emergency Medicine

## 2019-09-27 ENCOUNTER — Other Ambulatory Visit: Payer: Self-pay

## 2019-09-27 ENCOUNTER — Emergency Department
Admission: EM | Admit: 2019-09-27 | Discharge: 2019-09-27 | Disposition: A | Discharge status: Home / Self Care | Payer: Medicare Other | Attending: Emergency Medicine | Admitting: Emergency Medicine

## 2019-09-27 DIAGNOSIS — C8590 Non-Hodgkin lymphoma, unspecified, unspecified site: Secondary | ICD-10-CM | POA: Diagnosis not present

## 2019-09-27 DIAGNOSIS — R42 Dizziness and giddiness: Secondary | ICD-10-CM | POA: Diagnosis not present

## 2019-09-27 DIAGNOSIS — R531 Weakness: Secondary | ICD-10-CM | POA: Insufficient documentation

## 2019-09-27 DIAGNOSIS — E039 Hypothyroidism, unspecified: Secondary | ICD-10-CM | POA: Diagnosis not present

## 2019-09-27 DIAGNOSIS — I10 Essential (primary) hypertension: Secondary | ICD-10-CM | POA: Insufficient documentation

## 2019-09-27 DIAGNOSIS — Z87891 Personal history of nicotine dependence: Secondary | ICD-10-CM | POA: Diagnosis not present

## 2019-09-27 LAB — URINALYSIS, COMPLETE (UACMP) WITH MICROSCOPIC
Bilirubin Urine: NEGATIVE
Glucose, UA: NEGATIVE mg/dL
Hgb urine dipstick: NEGATIVE
Ketones, ur: NEGATIVE mg/dL
Nitrite: NEGATIVE
Protein, ur: NEGATIVE mg/dL
Specific Gravity, Urine: 1.009 (ref 1.005–1.030)
pH: 6 (ref 5.0–8.0)

## 2019-09-27 LAB — CBC WITH DIFFERENTIAL/PLATELET
Abs Immature Granulocytes: 0.02 10*3/uL (ref 0.00–0.07)
Basophils Absolute: 0.1 10*3/uL (ref 0.0–0.1)
Basophils Relative: 1 %
Eosinophils Absolute: 0.1 10*3/uL (ref 0.0–0.5)
Eosinophils Relative: 2 %
HCT: 45.3 % (ref 36.0–46.0)
Hemoglobin: 15 g/dL (ref 12.0–15.0)
Immature Granulocytes: 0 %
Lymphocytes Relative: 50 %
Lymphs Abs: 3.2 10*3/uL (ref 0.7–4.0)
MCH: 31.5 pg (ref 26.0–34.0)
MCHC: 33.1 g/dL (ref 30.0–36.0)
MCV: 95.2 fL (ref 80.0–100.0)
Monocytes Absolute: 0.9 10*3/uL (ref 0.1–1.0)
Monocytes Relative: 14 %
Neutro Abs: 2.1 10*3/uL (ref 1.7–7.7)
Neutrophils Relative %: 33 %
Platelets: 260 10*3/uL (ref 150–400)
RBC: 4.76 MIL/uL (ref 3.87–5.11)
RDW: 13.2 % (ref 11.5–15.5)
WBC: 6.4 10*3/uL (ref 4.0–10.5)
nRBC: 0 % (ref 0.0–0.2)

## 2019-09-27 LAB — COMPREHENSIVE METABOLIC PANEL
ALT: 17 U/L (ref 0–44)
AST: 27 U/L (ref 15–41)
Albumin: 4.4 g/dL (ref 3.5–5.0)
Alkaline Phosphatase: 80 U/L (ref 38–126)
Anion gap: 12 (ref 5–15)
BUN: 14 mg/dL (ref 8–23)
CO2: 24 mmol/L (ref 22–32)
Calcium: 9.7 mg/dL (ref 8.9–10.3)
Chloride: 104 mmol/L (ref 98–111)
Creatinine, Ser: 0.65 mg/dL (ref 0.44–1.00)
GFR calc Af Amer: >60 mL/min (ref >60)
GFR calc non Af Amer: >60 mL/min (ref >60)
Glucose, Bld: 108 mg/dL — ABNORMAL HIGH (ref 70–99)
Potassium: 4.9 mmol/L (ref 3.5–5.1)
Sodium: 140 mmol/L (ref 135–145)
Total Bilirubin: 1.2 mg/dL (ref 0.3–1.2)
Total Protein: 7.2 g/dL (ref 6.5–8.1)

## 2019-09-27 LAB — GLUCOSE, CAPILLARY: Glucose-Capillary: 86 mg/dL (ref 70–99)

## 2019-09-27 MED ORDER — HYDROMORPHONE HCL 1 MG/ML IJ SOLN
0.5000 mg | Freq: Once | INTRAMUSCULAR | Status: AC
  Administered 2019-09-27: 0.5 mg via INTRAVENOUS
  Filled 2019-09-27: qty 1

## 2019-09-27 MED ORDER — HYDROCODONE-ACETAMINOPHEN 5-325 MG PO TABS: 1.0000 | ORAL_TABLET | Freq: Four times a day (QID) | ORAL | 0 refills | Status: AC | PRN

## 2019-09-27 MED ORDER — DIAZEPAM 2 MG PO TABS: 2.0000 mg | ORAL_TABLET | Freq: Three times a day (TID) | ORAL | 0 refills | Status: AC | PRN

## 2019-09-27 NOTE — ED Notes (Signed)
Pt ambulated approximately 50 feet with one assist.  Pt with unsteady gait due to recent medication given (see MAR), states, "I feel drunk after that medicine."  EDP aware.

## 2019-09-27 NOTE — ED Notes (Signed)
Patient transported to CT 

## 2019-09-27 NOTE — ED Triage Notes (Signed)
Brought over from [facility]  With left sided weakness and elevated B/P

## 2019-09-27 NOTE — ED Notes (Signed)
Patient ambulatory to toilet with standby assist.

## 2019-09-27 NOTE — ED Notes (Signed)
EDP to bedside; update provided to patient.

## 2019-09-27 NOTE — ED Provider Notes (Signed)
[facility] Emergency Department Provider Note       Time seen: ----------------------------------------- 9:38 AM on 09/27/2019 -----------------------------------------   I have reviewed the triage vital signs and the nursing notes.  HISTORY   Chief Complaint Weakness  HPI Kaitlin Thompson is a 68 y.o. female with a history of anxiety, GERD, hypertension, kidney stones, non-Hodgkin's lymphoma who presents to the ED for some left-sided weakness with elevated blood pressure.  Patient was at [facility] for back injections.  She was having some left arm pain.  Recently she felt like her left leg was weaker and that she was not picking it up when she walked.  She describes some decrease sensation in the left leg but she is not sure if that is new or old.  Past Medical History:  Diagnosis Date  . Anxiety   . GERD (gastroesophageal reflux disease)   . History of appendectomy    30 plus yrs ago  . Hypertension   . Hypothyroidism   . Kidney stones   . Non Hodgkin's lymphoma ([place]) 2015  . Personal history of chemotherapy   . S/P partial hysterectomy   . Thyroid disease    hypothyroidism    Patient Active Problem List   Diagnosis Date Noted  . 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 Mass of right ovary 01/28/2016  . Anxiety 01/28/2016  . Arthritis 01/28/2016  . Adenopathy 01/28/2016  . Headache, migraine 01/28/2016  . Adult hypothyroidism 01/28/2016  . BP (high blood pressure) 01/28/2016  . DD (diverticular disease) 01/28/2016  . Acid reflux 01/28/2016  . Degeneration of intervertebral disc of lumbar region 11/06/2015  . Non-Hodgkin's lymphoma ([place]) 11/29/2014  . Degenerative arthritis of lumbar spine 04/27/2014  . Lumbar canal stenosis 04/27/2014  . Neuritis or radiculitis due to rupture of lumbar intervertebral disc 04/27/2014    Past Surgical History:  Procedure Laterality Date  . ABDOMINAL HYSTERECTOMY     . APPENDECTOMY    . AXILLARY LYMPH NODE BIOPSY Left 2015  . BREAST BIOPSY Right 07/15/1995   stereotactic biopsy at [facility]- neg  . CARPAL TUNNEL RELEASE    . CERVICAL SPINE SURGERY    . COLONOSCOPY WITH PROPOFOL N/A 12/20/2015   Procedure: COLONOSCOPY WITH PROPOFOL;  Surgeon: Hulen Luster, MD;  Location: [facility] ENDOSCOPY;  Service: Gastroenterology;  Laterality: N/A;  . ESOPHAGOGASTRODUODENOSCOPY (EGD) WITH PROPOFOL N/A 12/20/2015   Procedure: ESOPHAGOGASTRODUODENOSCOPY (EGD) WITH PROPOFOL;  Surgeon: Hulen Luster, MD;  Location: [facility] ENDOSCOPY;  Service: Gastroenterology;  Laterality: N/A;  . JOINT REPLACEMENT Left 01/2015   knee  . TONSILLECTOMY      Allergies Metoclopramide, Percocet [oxycodone-acetaminophen], and Sulfa antibiotics  Social History Social History   Tobacco Use  . Smoking status: Former Smoker    Packs/day: 1.00    Years: 20.00    Pack years: 20.00    Quit date: 01/27/1994    Years since quitting: 25.6  . Smokeless tobacco: Never Used  Substance Use Topics  . Alcohol use: No    Alcohol/week: 0.0 standard drinks  . Drug use: No    Review of Systems Constitutional: Negative for fever. Cardiovascular: Negative for chest pain. Respiratory: Negative for shortness of breath. Gastrointestinal: Negative for abdominal pain, vomiting and diarrhea. Musculoskeletal: Negative for back pain. Skin: Negative for rash. Neurological: Positive for numbness and weakness  All systems negative/normal/unremarkable except as stated in the HPI  ____________________________________________   PHYSICAL EXAM:  VITAL SIGNS: ED Triage Vitals  Enc Vitals Group     BP      Pulse      Resp      Temp      Temp src      SpO2      Weight      Height      Head Circumference      Peak Flow      Pain Score      Pain Loc      Pain Edu?      Excl. in [place]?    Constitutional: Alert and oriented. Well appearing and in no distress. Eyes: Conjunctivae are normal. Normal extraocular  movements. ENT      Head: Normocephalic and atraumatic.      Nose: No congestion/rhinnorhea.      Mouth/Throat: Mucous membranes are moist.      Neck: No stridor. Cardiovascular: Normal rate, regular rhythm. No murmurs, rubs, or gallops. Respiratory: Normal respiratory effort without tachypnea nor retractions. Breath sounds are clear and equal bilaterally. No wheezes/rales/rhonchi. Gastrointestinal: Soft and nontender. Normal bowel sounds Musculoskeletal: Nontender with normal range of motion in extremities. No lower extremity tenderness nor edema. Neurologic:  Normal speech and language. No gross focal neurologic deficits are appreciated.  Decreased sensation in the left leg compared to right, normal strength,  Otherwise sensation is normal, cranial nerves are unremarkable Skin:  Skin is warm, dry and intact. No rash noted. Psychiatric: Mood and affect are normal. Speech and behavior are normal.  ____________________________________________  EKG: Interpreted by me.  Sinus rhythm the rate of 90 bpm, possible septal infarct age-indeterminate, normal axis, normal QT  ____________________________________________  ED COURSE:  As part of my medical decision making, I reviewed the following data within the [place] History obtained from family if available, nursing notes, old chart and ekg, as well as notes from prior ED visits. Patient presented for left-sided weakness, we will assess with labs and imaging as indicated at this time.   Procedures  Kaitlin Thompson was evaluated in Emergency Department on 09/27/2019 for the symptoms described in the history of present illness. She was evaluated in the context of the global COVID-19 pandemic, which necessitated consideration that the patient might be at risk for infection with the SARS-CoV-2 virus that causes COVID-19. Institutional protocols and algorithms that pertain to the evaluation of patients at risk for COVID-19 are in a  state of rapid change based on information released by regulatory bodies including the CDC and federal and state organizations. These policies and algorithms were followed during the patient's care in the ED.  ____________________________________________   LABS (pertinent positives/negatives)  Labs Reviewed  COMPREHENSIVE METABOLIC PANEL - Abnormal; Notable for the following components:      Result Value   Glucose, Bld 108 (*)    All other components within normal limits  URINALYSIS, COMPLETE (UACMP) WITH MICROSCOPIC - Abnormal; Notable for the following components:   Color, Urine YELLOW (*)    APPearance CLEAR (*)    Leukocytes,Ua LARGE (*)    Bacteria, UA RARE (*)    All other components within normal limits  URINE CULTURE  GLUCOSE, CAPILLARY  CBC WITH DIFFERENTIAL/PLATELET    RADIOLOGY Images were viewed by me  CT head IMPRESSION:  Normal head CT for age  ____________________________________________   DIFFERENTIAL DIAGNOSIS   CVA, TIA, degenerative disc disease, neuropathy, hypertension  FINAL ASSESSMENT AND PLAN  Weakness, dizziness   Plan: The patient had presented for subacute neurologic symptoms. Patient's labs  have not revealed any acute process. Patient's imaging was negative.  She is able to ambulate without any obvious difficulty.  It is difficult to ascertain if this is acute or chronic.  I have not identified any acute issue.  She does have chronic dizziness and I will encourage her meclizine at home.  I will write for low-dose Valium to take with that.  She is cleared for outpatient follow-up.   Laurence Aly, MD    Note: This note was generated in part or whole with voice recognition software. Voice recognition is usually quite accurate but there are transcription errors that can and very often do occur. I apologize for any typographical errors that were not detected and corrected.     Earleen Newport, MD 09/27/19 [phone]

## 2019-09-29 LAB — URINE CULTURE: Culture: 20000 — AB

## 2019-09-30 NOTE — Progress Notes (Signed)
Brief Pharmacy Note  Patient with history of anxiety, HTN, kidney stones, non-Hodgkin's lymphoma who presented to [facility] ED c/o left-sided weakness and HTN. Patient was at [facility] for back injections prior to coming to the ED. Imaging was negative for acute intracranial process. She was discharged on low-dose valium and short course of pain medication.   Urine culture collected at ED visit resulted 20,000 colonies/mL E coli. UA with moderate pyuria, rare bacteria, few RBC. No report of urinary symptoms in chart. Likely asymptomatic bacteriuria. Discussed with EDP and will not pursue antibiotic treatment.  [place] Resident 30 September 2019

## 2019-10-07 ENCOUNTER — Ambulatory Visit: Payer: Medicare Other

## 2019-11-15 DIAGNOSIS — R292 Abnormal reflex: Secondary | ICD-10-CM | POA: Insufficient documentation

## 2019-11-21 ENCOUNTER — Other Ambulatory Visit: Payer: Self-pay | Admitting: Neurosurgery

## 2019-11-21 DIAGNOSIS — M4722 Other spondylosis with radiculopathy, cervical region: Secondary | ICD-10-CM

## 2019-12-05 ENCOUNTER — Ambulatory Visit
Admission: RE | Admit: 2019-12-05 | Discharge: 2019-12-05 | Disposition: A | Discharge status: Home / Self Care | Payer: Medicare Other | Source: Ambulatory Visit | Attending: Neurosurgery | Admitting: Neurosurgery

## 2019-12-05 ENCOUNTER — Other Ambulatory Visit: Payer: Self-pay

## 2019-12-05 DIAGNOSIS — M4722 Other spondylosis with radiculopathy, cervical region: Secondary | ICD-10-CM | POA: Diagnosis not present

## 2019-12-22 ENCOUNTER — Other Ambulatory Visit: Payer: Self-pay | Admitting: Family Medicine

## 2019-12-22 ENCOUNTER — Other Ambulatory Visit: Payer: Self-pay

## 2019-12-22 ENCOUNTER — Ambulatory Visit
Admission: RE | Admit: 2019-12-22 | Discharge: 2019-12-22 | Disposition: A | Discharge status: Home / Self Care | Payer: Medicare Other | Source: Ambulatory Visit | Attending: Family Medicine | Admitting: Family Medicine

## 2019-12-22 DIAGNOSIS — R1032 Left lower quadrant pain: Secondary | ICD-10-CM

## 2019-12-22 DIAGNOSIS — K5900 Constipation, unspecified: Secondary | ICD-10-CM | POA: Diagnosis present

## 2019-12-22 DIAGNOSIS — R1031 Right lower quadrant pain: Secondary | ICD-10-CM | POA: Diagnosis present

## 2019-12-22 DIAGNOSIS — R109 Unspecified abdominal pain: Secondary | ICD-10-CM | POA: Diagnosis present

## 2019-12-22 MED ORDER — IOHEXOL 300 MG/ML  SOLN
100.0000 mL | Freq: Once | INTRAMUSCULAR | Status: AC | PRN
  Administered 2019-12-22: 100 mL via INTRAVENOUS

## 2020-01-16 DIAGNOSIS — M4802 Spinal stenosis, cervical region: Secondary | ICD-10-CM | POA: Insufficient documentation

## 2020-02-23 ENCOUNTER — Telehealth: Payer: Self-pay | Admitting: *Deleted

## 2020-02-23 NOTE — Telephone Encounter (Signed)
Pt called to cancel her 02/28/20 lab/MD appts and stated that she will call back at a later date to R/S.Marland Kitchen

## 2020-02-28 ENCOUNTER — Inpatient Hospital Stay: Payer: Medicare Other | Admitting: Internal Medicine

## 2020-02-28 ENCOUNTER — Inpatient Hospital Stay: Payer: Medicare Other

## 2020-03-19 DIAGNOSIS — I700 Atherosclerosis of aorta: Secondary | ICD-10-CM | POA: Insufficient documentation

## 2020-04-09 ENCOUNTER — Other Ambulatory Visit: Payer: Self-pay | Admitting: Internal Medicine

## 2020-04-09 DIAGNOSIS — Z1231 Encounter for screening mammogram for malignant neoplasm of breast: Secondary | ICD-10-CM

## 2020-05-08 DIAGNOSIS — Z713 Dietary counseling and surveillance: Secondary | ICD-10-CM | POA: Insufficient documentation

## 2020-05-29 DIAGNOSIS — Z96659 Presence of unspecified artificial knee joint: Secondary | ICD-10-CM | POA: Insufficient documentation

## 2020-06-05 ENCOUNTER — Other Ambulatory Visit: Payer: Self-pay | Admitting: Internal Medicine

## 2020-06-05 ENCOUNTER — Ambulatory Visit
Admission: RE | Admit: 2020-06-05 | Discharge: 2020-06-05 | Disposition: A | Discharge status: Home / Self Care | Payer: Medicare Other | Source: Ambulatory Visit | Attending: Internal Medicine | Admitting: Internal Medicine

## 2020-06-05 DIAGNOSIS — Z1231 Encounter for screening mammogram for malignant neoplasm of breast: Secondary | ICD-10-CM

## 2020-06-05 DIAGNOSIS — N6320 Unspecified lump in the left breast, unspecified quadrant: Secondary | ICD-10-CM

## 2020-06-13 ENCOUNTER — Ambulatory Visit
Admission: RE | Admit: 2020-06-13 | Discharge: 2020-06-13 | Disposition: A | Discharge status: Home / Self Care | Payer: Medicare Other | Source: Ambulatory Visit | Attending: Internal Medicine | Admitting: Internal Medicine

## 2020-06-13 DIAGNOSIS — N6320 Unspecified lump in the left breast, unspecified quadrant: Secondary | ICD-10-CM

## 2020-06-13 DIAGNOSIS — N6321 Unspecified lump in the left breast, upper outer quadrant: Secondary | ICD-10-CM | POA: Insufficient documentation

## 2020-06-13 DIAGNOSIS — Z1231 Encounter for screening mammogram for malignant neoplasm of breast: Secondary | ICD-10-CM

## 2020-06-21 ENCOUNTER — Other Ambulatory Visit: Payer: Self-pay | Admitting: Internal Medicine

## 2020-06-21 DIAGNOSIS — N6320 Unspecified lump in the left breast, unspecified quadrant: Secondary | ICD-10-CM

## 2020-06-21 DIAGNOSIS — C8594 Non-Hodgkin lymphoma, unspecified, lymph nodes of axilla and upper limb: Secondary | ICD-10-CM

## 2020-06-27 ENCOUNTER — Ambulatory Visit: Payer: Medicare Other

## 2020-07-05 ENCOUNTER — Other Ambulatory Visit: Payer: Self-pay

## 2020-07-05 ENCOUNTER — Ambulatory Visit
Admission: RE | Admit: 2020-07-05 | Discharge: 2020-07-05 | Disposition: A | Discharge status: Home / Self Care | Payer: Medicare Other | Source: Ambulatory Visit | Attending: Internal Medicine | Admitting: Internal Medicine

## 2020-07-05 DIAGNOSIS — N6320 Unspecified lump in the left breast, unspecified quadrant: Secondary | ICD-10-CM | POA: Insufficient documentation

## 2020-07-05 DIAGNOSIS — C8594 Non-Hodgkin lymphoma, unspecified, lymph nodes of axilla and upper limb: Secondary | ICD-10-CM | POA: Insufficient documentation

## 2020-07-05 LAB — GLUCOSE, CAPILLARY: Glucose-Capillary: 96 mg/dL (ref 70–99)

## 2020-07-05 MED ORDER — FLUDEOXYGLUCOSE F - 18 (FDG) INJECTION
11.9400 | Freq: Once | INTRAVENOUS | Status: AC | PRN
  Administered 2020-07-05: 11.94 via INTRAVENOUS

## 2020-07-10 ENCOUNTER — Telehealth: Payer: Self-pay | Admitting: *Deleted

## 2020-07-10 DIAGNOSIS — C8588 Other specified types of non-Hodgkin lymphoma, lymph nodes of multiple sites: Secondary | ICD-10-CM

## 2020-07-10 NOTE — Telephone Encounter (Signed)
-----   Message from Clancy Gourd sent at 07/10/2020  1:01 PM EDT ----- Regarding: Appt Request Hello,  I just uploaded a [employer] into this pt's chart in Epic under the Media tab. It shows that pt is an established pt of Dr. Aletha Halim last seen 08/31/19. Can you please call pt to schedule an appt at [phone]? Thanks.

## 2020-07-10 NOTE — Telephone Encounter (Signed)
Spoke with patient. Dr. Ginette Pitman has referred her back to see Dr. B for a pet + lymphadenopathy. (pet on 07/06/2020). Next day apt in the clinic with Dr. B; however, pt preferred to scheduled the apt next week when her daughter is off from work. Apt given for 8/11 at 3:15pm.  Dr. B do you want pt to have labs at the next apt.

## 2020-07-11 NOTE — Addendum Note (Signed)
Addended by: Gloris Ham on: 07/11/2020 09:28 AM   Modules accepted: Orders

## 2020-07-11 NOTE — Telephone Encounter (Signed)
Lab encounter added and lab orders entered.

## 2020-07-11 NOTE — Telephone Encounter (Signed)
Cbc/cmp/LDH/ca-125. Thanks GB

## 2020-07-18 ENCOUNTER — Other Ambulatory Visit: Payer: Self-pay

## 2020-07-18 ENCOUNTER — Inpatient Hospital Stay: Payer: Medicare Other

## 2020-07-18 ENCOUNTER — Inpatient Hospital Stay: Payer: Medicare Other | Attending: Internal Medicine | Admitting: Internal Medicine

## 2020-07-18 DIAGNOSIS — Z9221 Personal history of antineoplastic chemotherapy: Secondary | ICD-10-CM | POA: Diagnosis not present

## 2020-07-18 DIAGNOSIS — Z79899 Other long term (current) drug therapy: Secondary | ICD-10-CM | POA: Insufficient documentation

## 2020-07-18 DIAGNOSIS — I10 Essential (primary) hypertension: Secondary | ICD-10-CM | POA: Insufficient documentation

## 2020-07-18 DIAGNOSIS — K219 Gastro-esophageal reflux disease without esophagitis: Secondary | ICD-10-CM | POA: Insufficient documentation

## 2020-07-18 DIAGNOSIS — F329 Major depressive disorder, single episode, unspecified: Secondary | ICD-10-CM | POA: Diagnosis not present

## 2020-07-18 DIAGNOSIS — Z87891 Personal history of nicotine dependence: Secondary | ICD-10-CM | POA: Diagnosis not present

## 2020-07-18 DIAGNOSIS — C8588 Other specified types of non-Hodgkin lymphoma, lymph nodes of multiple sites: Secondary | ICD-10-CM | POA: Diagnosis not present

## 2020-07-18 DIAGNOSIS — Z87442 Personal history of urinary calculi: Secondary | ICD-10-CM | POA: Diagnosis not present

## 2020-07-18 DIAGNOSIS — E039 Hypothyroidism, unspecified: Secondary | ICD-10-CM | POA: Diagnosis not present

## 2020-07-18 DIAGNOSIS — F419 Anxiety disorder, unspecified: Secondary | ICD-10-CM | POA: Insufficient documentation

## 2020-07-18 DIAGNOSIS — G629 Polyneuropathy, unspecified: Secondary | ICD-10-CM | POA: Insufficient documentation

## 2020-07-18 DIAGNOSIS — E079 Disorder of thyroid, unspecified: Secondary | ICD-10-CM | POA: Insufficient documentation

## 2020-07-18 LAB — CBC WITH DIFFERENTIAL/PLATELET
Abs Immature Granulocytes: 0.03 10*3/uL (ref 0.00–0.07)
Basophils Absolute: 0.1 10*3/uL (ref 0.0–0.1)
Basophils Relative: 1 %
Eosinophils Absolute: 0.2 10*3/uL (ref 0.0–0.5)
Eosinophils Relative: 4 %
HCT: 38.6 % (ref 36.0–46.0)
Hemoglobin: 12.8 g/dL (ref 12.0–15.0)
Immature Granulocytes: 0 %
Lymphocytes Relative: 49 %
Lymphs Abs: 3.4 10*3/uL (ref 0.7–4.0)
MCH: 31.1 pg (ref 26.0–34.0)
MCHC: 33.2 g/dL (ref 30.0–36.0)
MCV: 93.9 fL (ref 80.0–100.0)
Monocytes Absolute: 1.2 10*3/uL — ABNORMAL HIGH (ref 0.1–1.0)
Monocytes Relative: 18 %
Neutro Abs: 1.9 10*3/uL (ref 1.7–7.7)
Neutrophils Relative %: 28 %
Platelets: 279 10*3/uL (ref 150–400)
RBC: 4.11 MIL/uL (ref 3.87–5.11)
RDW: 13.6 % (ref 11.5–15.5)
WBC: 6.9 10*3/uL (ref 4.0–10.5)
nRBC: 0 % (ref 0.0–0.2)

## 2020-07-18 LAB — COMPREHENSIVE METABOLIC PANEL
ALT: 14 U/L (ref 0–44)
AST: 18 U/L (ref 15–41)
Albumin: 4.1 g/dL (ref 3.5–5.0)
Alkaline Phosphatase: 117 U/L (ref 38–126)
Anion gap: 7 (ref 5–15)
BUN: 16 mg/dL (ref 8–23)
CO2: 27 mmol/L (ref 22–32)
Calcium: 9 mg/dL (ref 8.9–10.3)
Chloride: 106 mmol/L (ref 98–111)
Creatinine, Ser: 0.74 mg/dL (ref 0.44–1.00)
GFR calc Af Amer: >60 mL/min (ref >60)
GFR calc non Af Amer: >60 mL/min (ref >60)
Glucose, Bld: 88 mg/dL (ref 70–99)
Potassium: 4.3 mmol/L (ref 3.5–5.1)
Sodium: 140 mmol/L (ref 135–145)
Total Bilirubin: 0.6 mg/dL (ref 0.3–1.2)
Total Protein: 7 g/dL (ref 6.5–8.1)

## 2020-07-18 LAB — LACTATE DEHYDROGENASE: LDH: 163 U/L (ref 98–192)

## 2020-07-18 NOTE — Progress Notes (Signed)
[place] OFFICE PROGRESS NOTE  Patient Care Team: Tracie Harrier, MD as PCP - General (Internal Medicine) Benjaman Kindler, MD as Consulting Physician (Obstetrics and Gynecology) Clent Jacks, RN as Registered Nurse  Cancer Staging No matching staging information was found for the patient.   Oncology History Overview Note  # MAY 2015- Diagnosis of   low-grade B-cell lymphoma marginal zone.  From the left axillary lymph node biopsy.  Patient had a weekly rituximab therapy in May 2015  # April 2017-   Progressive disease with enlargement of lymphadenopathy on a subsequent CT scan;APril 2017- Rituxan weekly x4  # October 2019- PET-STABLE; but symtomatic [extreme fatigue]; Ibrutinib 420 mg/day- 1 month; stopped nov 22nd 2019 [intol]; SEP 2020- PET- " improved"  #November 2019-depression-Celexa  #  Right adnexal mass and followed by Dr. Retia Passe for 2 years]; monitored.   ----------------------------------     DIAGNOSIS: [ ]  Marginal zone lymphoma  STAGE: 4  ;GOALS: Control  CURRENT/MOST RECENT THERAPY: on surveillance.     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  female pleasant patient above history of marginal zone lymphoma currently on surveillance is here for follow-up/review results of the PET scan.  Patient continues to have multitude of complaints mostly fatigue.  Extreme shortness of breath with minimal exertion.  No cough.  No swelling in the legs.  She continues to have drenching night sweats.  Complains of episodic dizziness.  No falls.  Review of Systems  Constitutional: Positive for malaise/fatigue. Negative for chills, diaphoresis, fever and weight loss.  HENT: Negative for nosebleeds and sore throat.   Eyes: Negative for double vision.  Respiratory: Positive for shortness of breath. Negative for cough, hemoptysis, sputum production and wheezing.    Cardiovascular: Negative for chest pain, orthopnea and leg swelling.  Gastrointestinal: Positive for abdominal pain. Negative for blood in stool, constipation, diarrhea, heartburn, melena, nausea and vomiting.  Genitourinary: Negative for dysuria, frequency and urgency.  Musculoskeletal: Positive for back pain and joint pain.  Skin: Negative.  Negative for itching and rash.  Neurological: Positive for dizziness. Negative for tingling, focal weakness, weakness and headaches.  Endo/Heme/Allergies: Does not bruise/bleed easily.  Psychiatric/Behavioral: Negative for depression. The patient is not nervous/anxious and does not have insomnia.       PAST MEDICAL HISTORY :  Past Medical History:  Diagnosis Date  . Anxiety   . GERD (gastroesophageal reflux disease)   . History of appendectomy    30 plus yrs ago  . Hypertension   . Hypothyroidism   . Kidney stones   . Non Hodgkin's lymphoma ([place]) 2015  . Personal history of chemotherapy   . S/P partial hysterectomy   . Thyroid disease    hypothyroidism    PAST SURGICAL HISTORY :   Past Surgical History:  Procedure Laterality Date  . ABDOMINAL HYSTERECTOMY    . APPENDECTOMY    . AXILLARY LYMPH NODE BIOPSY Left 2015  . BREAST BIOPSY Right 07/15/1995   stereotactic biopsy at [facility]- neg  . CARPAL TUNNEL RELEASE    . CERVICAL SPINE SURGERY    . COLONOSCOPY WITH PROPOFOL N/A 12/20/2015   Procedure: COLONOSCOPY WITH PROPOFOL;  Surgeon: Hulen Luster, MD;  Location: [facility] ENDOSCOPY;  Service: Gastroenterology;  Laterality: N/A;  . ESOPHAGOGASTRODUODENOSCOPY (EGD) WITH PROPOFOL N/A 12/20/2015   Procedure: ESOPHAGOGASTRODUODENOSCOPY (EGD) WITH PROPOFOL;  Surgeon: Hulen Luster, MD;  Location: [facility] ENDOSCOPY;  Service: Gastroenterology;  Laterality: N/A;  . JOINT REPLACEMENT Left 01/2015   knee  . TONSILLECTOMY      FAMILY HISTORY :   Family History  Problem Relation Age of Onset  . Cancer Other        breast cancer, colon cancer, lymphoma, ovarian  cancer  . Diabetes Other   . Anemia Other   . CAD Other   . Liver disease Other   . Breast cancer Daughter 10  . Lymphoma Mother   . Pancreatic cancer Father   . Liver cancer Father   . Breast cancer Daughter 56    SOCIAL HISTORY:   Social History   Tobacco Use  . Smoking status: Former Smoker    Packs/day: 1.00    Years: 20.00    Pack years: 20.00    Quit date: 01/27/1994    Years since quitting: 26.5  . Smokeless tobacco: Never Used  Substance Use Topics  . Alcohol use: No    Alcohol/week: 0.0 standard drinks  . Drug use: No    ALLERGIES:  is allergic to metoclopramide, percocet [oxycodone-acetaminophen], and sulfa antibiotics.  MEDICATIONS:  Current Outpatient Medications  Medication Sig Dispense Refill  . amLODipine (NORVASC) 2.5 MG tablet Take 2.5 mg by mouth daily.    Marland Kitchen aspirin 81 MG tablet Take 81 mg by mouth every morning. Reported on 02/06/2016    . fluticasone (FLONASE) 50 MCG/ACT nasal spray Place 1 spray into both nostrils daily.     Marland Kitchen HYDROcodone-acetaminophen (NORCO/VICODIN) 5-325 MG tablet Take 1 tablet by mouth every 6 (six) hours as needed for moderate pain. 12 tablet 0  . levothyroxine (SYNTHROID) 88 MCG tablet TAKE 1 TABLET BY MOUTH ONCE DAILY ON AN EMPTY STOMACH  WITH A GLASS OF WATER, AT  LEAST 30 TO 60 MINUTES  BEFORE BREAKFAST    . meclizine (ANTIVERT) 25 MG tablet Take 25 mg by mouth as needed.    Marland Kitchen omeprazole (PRILOSEC) 20 MG capsule Take 20 mg by mouth every morning.     . propranolol (INDERAL) 20 MG tablet Take 1 tablet by mouth daily.    Marland Kitchen venlafaxine XR (EFFEXOR-XR) 75 MG 24 hr capsule venlafaxine ER 75 mg capsule,extended release 24 hr    . ALPRAZolam (XANAX) 0.25 MG tablet Take 0.25 mg by mouth at bedtime as needed for anxiety. (Patient not taking: Reported on 07/18/2020)    . diazepam (VALIUM) 2 MG tablet Take 1 tablet (2 mg total) by mouth every 8 (eight) hours as needed (dizziness). (Patient not taking: Reported on 07/18/2020) 30 tablet 0  .  montelukast (SINGULAIR) 10 MG tablet  (Patient not taking: Reported on 07/18/2020)  0  . propranolol (INDERAL) 20 MG tablet Take by mouth.     No current facility-administered medications for this visit.    PHYSICAL EXAMINATION: ECOG PERFORMANCE STATUS: 1 - Symptomatic but completely ambulatory  BP 131/83 (BP Location: Right Arm, Patient Position: Sitting)   Pulse 84   Temp 98.7 F (37.1 C) (Tympanic)   Resp 16   Wt 223 lb (101.2 kg)   SpO2 94%   BMI 38.28 kg/m   Filed Weights   07/18/20 1519  Weight: 223 lb (101.2 kg)    Physical Exam Constitutional:      Comments: She is walking herself.  Accompanied by her niece.   HENT:     Head: Normocephalic and atraumatic.     Mouth/Throat:     Pharynx: No oropharyngeal exudate.  Eyes:     Pupils:  Pupils are equal, round, and reactive to light.  Cardiovascular:     Rate and Rhythm: Normal rate and regular rhythm.  Pulmonary:     Effort: Pulmonary effort is normal. No respiratory distress.     Breath sounds: Normal breath sounds. No wheezing.  Abdominal:     General: Bowel sounds are normal. There is no distension.     Palpations: Abdomen is soft. There is no mass.     Tenderness: There is no abdominal tenderness. There is no guarding or rebound.  Musculoskeletal:        General: No tenderness. Normal range of motion.     Cervical back: Normal range of motion and neck supple.  Skin:    General: Skin is warm.  Neurological:     Mental Status: She is alert and oriented to person, place, and time.  Psychiatric:        Mood and Affect: Affect normal.    LABORATORY DATA:  I have reviewed the data as listed    Component Value Date/Time   NA 140 07/18/2020 1503   NA 139 01/08/2015 1058   K 4.3 07/18/2020 1503   K 4.3 01/08/2015 1058   CL 106 07/18/2020 1503   CL 107 01/08/2015 1058   CO2 27 07/18/2020 1503   CO2 26 01/08/2015 1058   GLUCOSE 88 07/18/2020 1503   GLUCOSE 96 01/08/2015 1058   BUN 16 07/18/2020 1503   BUN  11 01/08/2015 1058   CREATININE 0.74 07/18/2020 1503   CREATININE 0.63 01/08/2015 1058   CALCIUM 9.0 07/18/2020 1503   CALCIUM 9.3 01/08/2015 1058   PROT 7.0 07/18/2020 1503   PROT 7.0 07/11/2014 1010   ALBUMIN 4.1 07/18/2020 1503   ALBUMIN 3.6 07/11/2014 1010   AST 18 07/18/2020 1503   AST 23 07/11/2014 1010   ALT 14 07/18/2020 1503   ALT 25 07/11/2014 1010   ALKPHOS 117 07/18/2020 1503   ALKPHOS 84 07/11/2014 1010   BILITOT 0.6 07/18/2020 1503   BILITOT 0.4 07/11/2014 1010   GFRNONAA >60 07/18/2020 1503   GFRNONAA >60 01/08/2015 1058   GFRNONAA >60 07/11/2014 1010   GFRAA >60 07/18/2020 1503   GFRAA >60 01/08/2015 1058   GFRAA >60 07/11/2014 1010    No results found for: SPEP, UPEP  Lab Results  Component Value Date   WBC 6.9 07/18/2020   NEUTROABS 1.9 07/18/2020   HGB 12.8 07/18/2020   HCT 38.6 07/18/2020   MCV 93.9 07/18/2020   PLT 279 07/18/2020      Chemistry      Component Value Date/Time   NA 140 07/18/2020 1503   NA 139 01/08/2015 1058   K 4.3 07/18/2020 1503   K 4.3 01/08/2015 1058   CL 106 07/18/2020 1503   CL 107 01/08/2015 1058   CO2 27 07/18/2020 1503   CO2 26 01/08/2015 1058   BUN 16 07/18/2020 1503   BUN 11 01/08/2015 1058   CREATININE 0.74 07/18/2020 1503   CREATININE 0.63 01/08/2015 1058      Component Value Date/Time   CALCIUM 9.0 07/18/2020 1503   CALCIUM 9.3 01/08/2015 1058   ALKPHOS 117 07/18/2020 1503   ALKPHOS 84 07/11/2014 1010   AST 18 07/18/2020 1503   AST 23 07/11/2014 1010   ALT 14 07/18/2020 1503   ALT 25 07/11/2014 1010   BILITOT 0.6 07/18/2020 1503   BILITOT 0.4 07/11/2014 1010       RADIOGRAPHIC STUDIES: I have personally reviewed the radiological images as  listed and agreed with the findings in the report. No results found.   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  I  would not think patient's symptoms are elevated related to her slightly abnormal lymph nodes as noted on PET scan.  Continue surveillance of therapy.  #Multitude of symptoms [unrelated to lymphoma]-extreme fatigue night sweats dizziness-unclear etiology.  Discussed regarding sleep study evaluation for fatigue.  Patient declines.  #Peripheral neuropathy-question etiology; poor tolerance to Lyrica.  Stable.  Defer to PCP.  #I had a long discussion with the patient and her niece [works for oncology in [place]]-regarding the discrepancy between overall stable PET scan and patient continued symptoms.  Offered second opinion-currently declines.  Patient for now also continue to follow-up with PCP and will follow up with [place] as needed.  Patient was given a copy of the PET scan/pathology report from 2015 biopsy.   # DISPOSITION: print copy of PET scan/biopsy report from 2015.  # Follow up as needed-  Dr.B  # I reviewed the blood work- with the patient in detail; also reviewed the imaging independently [as summarized above]; and with the patient in detail.   Cc; Dr.Hande     No orders of the defined types were placed in this encounter.  All questions were answered. The patient knows to call the clinic with any problems, questions or concerns.      Cammie Sickle, MD 07/22/2020 5:02 PM

## 2020-07-18 NOTE — Assessment & Plan Note (Addendum)
#   Marginal Zone lymphoma-stage IV; currently on surveillance however PET scan july 2021- "STABLE ".  I would not think patient's symptoms are elevated related to her slightly abnormal lymph nodes as noted on PET scan.  Continue surveillance of therapy.  #Multitude of symptoms [unrelated to lymphoma]-extreme fatigue night sweats dizziness-unclear etiology.  Discussed regarding sleep study evaluation for fatigue.  Patient declines.  #Peripheral neuropathy-question etiology; poor tolerance to Lyrica.  Stable.  Defer to PCP.  #I had a long discussion with the patient and her niece [works for oncology in [place]]-regarding the discrepancy between overall stable PET scan and patient continued symptoms.  Offered second opinion-currently declines.  Patient for now also continue to follow-up with PCP and will follow up with [place] as needed.  Patient was given a copy of the PET scan/pathology report from 2015 biopsy.   # DISPOSITION: print copy of PET scan/biopsy report from 2015.  # Follow up as needed-  Dr.B  # I reviewed the blood work- with the patient in detail; also reviewed the imaging independently [as summarized above]; and with the patient in detail.   Cc; Dr.Hande

## 2020-07-19 LAB — CA 125: Cancer Antigen (CA) 125: 19.3 U/mL (ref 0.0–38.1)

## 2021-03-05 ENCOUNTER — Other Ambulatory Visit: Payer: Self-pay | Admitting: Family Medicine

## 2021-03-05 DIAGNOSIS — M5412 Radiculopathy, cervical region: Secondary | ICD-10-CM

## 2021-03-16 ENCOUNTER — Other Ambulatory Visit: Payer: Self-pay

## 2021-03-16 ENCOUNTER — Ambulatory Visit
Admission: RE | Admit: 2021-03-16 | Discharge: 2021-03-16 | Disposition: A | Discharge status: Home / Self Care | Payer: Medicare Other | Source: Ambulatory Visit | Attending: Family Medicine | Admitting: Family Medicine

## 2021-03-16 DIAGNOSIS — M5412 Radiculopathy, cervical region: Secondary | ICD-10-CM | POA: Diagnosis not present

## 2021-05-30 ENCOUNTER — Other Ambulatory Visit: Payer: Self-pay | Admitting: Nurse Practitioner

## 2021-05-30 ENCOUNTER — Other Ambulatory Visit (HOSPITAL_COMMUNITY): Payer: Self-pay | Admitting: Nurse Practitioner

## 2021-05-30 DIAGNOSIS — C8580 Other specified types of non-Hodgkin lymphoma, unspecified site: Secondary | ICD-10-CM

## 2021-06-11 ENCOUNTER — Other Ambulatory Visit: Payer: Self-pay | Admitting: Oncology

## 2021-06-11 DIAGNOSIS — C8580 Other specified types of non-Hodgkin lymphoma, unspecified site: Secondary | ICD-10-CM

## 2021-06-13 ENCOUNTER — Ambulatory Visit: Admission: RE | Admit: 2021-06-13 | Payer: Medicare Other | Source: Ambulatory Visit

## 2021-06-25 ENCOUNTER — Ambulatory Visit: Payer: Medicare Other

## 2021-07-24 ENCOUNTER — Ambulatory Visit
Admission: RE | Admit: 2021-07-24 | Discharge: 2021-07-24 | Disposition: A | Discharge status: Home / Self Care | Payer: Medicare Other | Source: Ambulatory Visit | Attending: Oncology | Admitting: Oncology

## 2021-07-24 ENCOUNTER — Other Ambulatory Visit: Payer: Self-pay

## 2021-07-24 DIAGNOSIS — C8580 Other specified types of non-Hodgkin lymphoma, unspecified site: Secondary | ICD-10-CM | POA: Diagnosis present

## 2021-07-24 DIAGNOSIS — I700 Atherosclerosis of aorta: Secondary | ICD-10-CM | POA: Diagnosis not present

## 2021-07-24 LAB — GLUCOSE, CAPILLARY: Glucose-Capillary: 110 mg/dL — ABNORMAL HIGH (ref 70–99)

## 2021-07-24 MED ORDER — FLUDEOXYGLUCOSE F - 18 (FDG) INJECTION
11.2100 | Freq: Once | INTRAVENOUS | Status: AC | PRN
  Administered 2021-07-24: 11.21 via INTRAVENOUS

## 2021-09-25 ENCOUNTER — Other Ambulatory Visit: Payer: Self-pay | Admitting: Internal Medicine

## 2021-09-25 DIAGNOSIS — R928 Other abnormal and inconclusive findings on diagnostic imaging of breast: Secondary | ICD-10-CM

## 2021-09-25 DIAGNOSIS — Z8572 Personal history of non-Hodgkin lymphomas: Secondary | ICD-10-CM

## 2021-10-23 ENCOUNTER — Ambulatory Visit (INDEPENDENT_AMBULATORY_CARE_PROVIDER_SITE_OTHER): Payer: Medicare Other

## 2021-10-23 ENCOUNTER — Ambulatory Visit: Payer: Medicare Other | Admitting: Podiatry

## 2021-10-23 ENCOUNTER — Encounter: Payer: Self-pay | Admitting: Podiatry

## 2021-10-23 ENCOUNTER — Other Ambulatory Visit: Payer: Self-pay | Admitting: Podiatry

## 2021-10-23 ENCOUNTER — Other Ambulatory Visit: Payer: Self-pay

## 2021-10-23 DIAGNOSIS — M1711 Unilateral primary osteoarthritis, right knee: Secondary | ICD-10-CM | POA: Insufficient documentation

## 2021-10-23 DIAGNOSIS — M7661 Achilles tendinitis, right leg: Secondary | ICD-10-CM | POA: Diagnosis not present

## 2021-10-23 DIAGNOSIS — M7750 Other enthesopathy of unspecified foot: Secondary | ICD-10-CM

## 2021-10-23 DIAGNOSIS — M7751 Other enthesopathy of right foot: Secondary | ICD-10-CM

## 2021-10-23 DIAGNOSIS — J449 Chronic obstructive pulmonary disease, unspecified: Secondary | ICD-10-CM | POA: Insufficient documentation

## 2021-10-23 DIAGNOSIS — M7752 Other enthesopathy of left foot: Secondary | ICD-10-CM

## 2021-10-23 DIAGNOSIS — M25569 Pain in unspecified knee: Secondary | ICD-10-CM | POA: Insufficient documentation

## 2021-10-23 DIAGNOSIS — F418 Other specified anxiety disorders: Secondary | ICD-10-CM | POA: Insufficient documentation

## 2021-10-23 DIAGNOSIS — M722 Plantar fascial fibromatosis: Secondary | ICD-10-CM

## 2021-10-23 MED ORDER — TRIAMCINOLONE ACETONIDE 40 MG/ML IJ SUSP
20.0000 mg | Freq: Once | INTRAMUSCULAR | Status: AC
  Administered 2021-10-23: 20 mg

## 2021-10-23 MED ORDER — DEXAMETHASONE SODIUM PHOSPHATE 120 MG/30ML IJ SOLN
2.0000 mg | Freq: Once | INTRAMUSCULAR | Status: AC
  Administered 2021-10-23: 2 mg via INTRA_ARTICULAR

## 2021-10-23 NOTE — Progress Notes (Signed)
She presents today chief complaint of pain left heel and medial ankle plantarly and right foot demonstrates tenderness to the distal aspect of the Achilles at its distalmost insertion right foot.  Objective: Vital signs are stable alert and oriented x3.  Pulses are palpable.  No reproduction of pain along the medial ankle.  She has exquisite tenderness on palpation medial calcaneal tubercle of the left heel and tenderness on palpation of the posterior aspect of the right heel with fluctuance beneath the skin most likely a bursa.  Radiographs demonstrate a hypertrophy of the Achilles tendon bilaterally the left foot has been surgically altered she also has internal fixation second metatarsals bilateral.  Assessment plan fasciitis left bursitis posterior heel right  Plan: Injected 2 mg of dexamethasone posterior heel right injected the left heel plantarly with 10 mg of Kenalog.  I will follow-up with her in a few weeks.

## 2022-01-01 ENCOUNTER — Ambulatory Visit
Admission: RE | Admit: 2022-01-01 | Discharge: 2022-01-01 | Disposition: A | Discharge status: Home / Self Care | Payer: Medicare Other | Source: Ambulatory Visit | Attending: Internal Medicine | Admitting: Internal Medicine

## 2022-01-01 ENCOUNTER — Other Ambulatory Visit: Payer: Self-pay

## 2022-01-01 DIAGNOSIS — R928 Other abnormal and inconclusive findings on diagnostic imaging of breast: Secondary | ICD-10-CM | POA: Diagnosis present

## 2022-01-01 DIAGNOSIS — Z8572 Personal history of non-Hodgkin lymphomas: Secondary | ICD-10-CM

## 2022-03-18 ENCOUNTER — Other Ambulatory Visit: Payer: Self-pay | Admitting: Physician Assistant

## 2022-03-18 DIAGNOSIS — R42 Dizziness and giddiness: Secondary | ICD-10-CM

## 2022-03-18 DIAGNOSIS — H538 Other visual disturbances: Secondary | ICD-10-CM

## 2022-03-18 DIAGNOSIS — R2689 Other abnormalities of gait and mobility: Secondary | ICD-10-CM

## 2022-03-27 ENCOUNTER — Ambulatory Visit
Admission: RE | Admit: 2022-03-27 | Discharge: 2022-03-27 | Disposition: A | Discharge status: Home / Self Care | Payer: Medicare Other | Source: Ambulatory Visit | Attending: Physician Assistant | Admitting: Physician Assistant

## 2022-03-27 DIAGNOSIS — R42 Dizziness and giddiness: Secondary | ICD-10-CM | POA: Diagnosis present

## 2022-03-27 DIAGNOSIS — R2689 Other abnormalities of gait and mobility: Secondary | ICD-10-CM | POA: Insufficient documentation

## 2022-03-27 DIAGNOSIS — H538 Other visual disturbances: Secondary | ICD-10-CM | POA: Diagnosis present

## 2022-03-27 MED ORDER — GADOBUTROL 1 MMOL/ML IV SOLN
10.0000 mL | Freq: Once | INTRAVENOUS | Status: AC | PRN
  Administered 2022-03-27: 10 mL via INTRAVENOUS

## 2022-05-07 ENCOUNTER — Ambulatory Visit: Payer: Medicare Other | Admitting: Podiatry

## 2022-05-07 ENCOUNTER — Encounter: Payer: Self-pay | Admitting: Podiatry

## 2022-05-07 DIAGNOSIS — M7661 Achilles tendinitis, right leg: Secondary | ICD-10-CM

## 2022-05-07 DIAGNOSIS — M7752 Other enthesopathy of left foot: Secondary | ICD-10-CM | POA: Diagnosis not present

## 2022-05-07 DIAGNOSIS — G5700 Lesion of sciatic nerve, unspecified lower limb: Secondary | ICD-10-CM

## 2022-05-07 MED ORDER — TRIAMCINOLONE ACETONIDE 40 MG/ML IJ SUSP
40.0000 mg | Freq: Once | INTRAMUSCULAR | Status: AC
  Administered 2022-05-07: 40 mg

## 2022-05-07 NOTE — Progress Notes (Signed)
Kaitlin Thompson presents today states that told her that we most likely have neuropathy and she states that she thinks is getting worse.  She says that she has burning and even cold sensations and they still swell a lot.  She states that the back of her heels hurt and she said that this side over here hurts as she points to her sinus tarsi of her left foot.  Objective: Vital signs are stable she is alert and oriented x3 she has a history of back pain with sciatica and neuropathy.  She has radiating pain from her back down her legs.  She does have reproducible heel pain right posterior heel the left posterior heel I have surgically corrected and seems to be doing fine.  The left foot however does demonstrate swelling overlying the sinus tarsi with tenderness on palpation of the sinus tarsi and end range of motion of the subtalar joint.  Assessment: Subtalar joint capsulitis left.  Bursitis posterior aspect of the right heel.  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  Plan: Discussed etiology pathology conservative surgical therapies at this point I injected the posterior subcutaneous bursa today with 2 mg of dexamethasone local anesthetic.  The left foot I injected 10 mg Kenalog 5 mg Marcaine point of maximal tenderness subtalar joint left foot.  Tolerated procedure well I am also requesting that she be sent for neurology evaluation and treatment

## 2022-07-01 ENCOUNTER — Ambulatory Visit: Payer: Medicare Other | Admitting: Neurology

## 2022-07-01 ENCOUNTER — Encounter: Payer: Self-pay | Admitting: Neurology

## 2022-07-01 VITALS — BP 137/77 | HR 80 | Ht 65.0 in | Wt 229.6 lb

## 2022-07-01 DIAGNOSIS — R269 Unspecified abnormalities of gait and mobility: Secondary | ICD-10-CM | POA: Diagnosis not present

## 2022-07-01 DIAGNOSIS — G2581 Restless legs syndrome: Secondary | ICD-10-CM

## 2022-07-01 DIAGNOSIS — M5442 Lumbago with sciatica, left side: Secondary | ICD-10-CM | POA: Diagnosis not present

## 2022-07-01 DIAGNOSIS — G8929 Other chronic pain: Secondary | ICD-10-CM

## 2022-07-01 DIAGNOSIS — R202 Paresthesia of skin: Secondary | ICD-10-CM

## 2022-07-01 MED ORDER — GABAPENTIN 100 MG PO CAPS: 300.0000 mg | ORAL_CAPSULE | Freq: Every day | ORAL | 6 refills | Status: AC

## 2022-07-01 MED ORDER — DULOXETINE HCL 30 MG PO CPEP: 30.0000 mg | ORAL_CAPSULE | Freq: Every day | ORAL | 3 refills | Status: DC

## 2022-07-01 NOTE — Progress Notes (Signed)
Chief Complaint  Patient presents with   New Patient (Initial Visit)    Room 16 w/ husband, Shanon Brow. Neuropathy in bilateral lower extremities, along with muscle cramps. Long history of low back pain. Gabapentin and pregabalin both caused intolerable dizziness. She is using OTC Nervive with no real benefits. She is currently on a Medrol '4mg'$  tapering dose pack.       ASSESSMENT AND PLAN  Kaitlin Thompson is a 71 y.o. female   Bilateral lower extremity paresthesia,  Well-preserved bilateral lower extremity vibratory sensation, pinprick, no evidence of significant large fiber peripheral neuropathy, Diffuse body achy pain  Laboratory evaluation include inflammatory markers, also rule out treatable etiology for small fiber neuropathy  Add on Cymbalta 30 mg daily  Restless leg symptoms  Tried gabapentin 100 mg titrating to 3 tablets every night  Return to clinic with nurse practitioner in 6 months  DIAGNOSTIC DATA (LABS, IMAGING, TESTING) - I reviewed patient records, labs, notes, testing and imaging myself where available.   MEDICAL HISTORY:  Kaitlin Thompson is a 71 year old female, seen in request by podiatrist Milinda Pointer, Max T, for evaluation of bilateral feet discomfort, primary care physician is Dr. Tracie Harrier, MD   I reviewed and summarized the referring note. PMHX HTN Anxiety Hypothyroidism Chronic low back pain, epidural injection prn GERD Non Hodgkin's Lymphoma, s/p chemotherapy, Cervical decompression surgery, 1999, 2021 Dr. Arnoldo Morale, presented neck pain, headache, numbness in left arm. She still has neck pain.  She reported a history of left Achilles tendon surgery few years ago, wearing cast for extended period of time, since then, she had frequent left lower extremity swelling, paresthesia achy pain, was diagnosed with neuropathy  Her symptoms is much worse since 2023, began to involving the right lower extremity as well, numbness tingling at the bottom of her feet  sometimes her toes, left worse than right  She also complains of intermittent low back pain, mainly stay at her back, did have a history of bilateral knee replacement, left side did not recover well, still has significant pain  She complains of difficulty sleeping at nighttime because of her leg discomfort, urged to move, difficult to find a comfortable position   PHYSICAL EXAM:   Vitals:   07/01/22 1009  BP: 137/77  Pulse: 80  Weight: 229 lb 9.6 oz (104.1 kg)  Height: '5\' 5"'$  (1.651 m)   Not recorded     Body mass index is 38.21 kg/m.  PHYSICAL EXAMNIATION:  Gen: NAD, conversant, well nourised, well groomed                     Cardiovascular: Regular rate rhythm, no peripheral edema, warm, nontender. Eyes: Conjunctivae clear without exudates or hemorrhage Neck: Supple, no carotid bruits. Pulmonary: Clear to auscultation bilaterally   NEUROLOGICAL EXAM:  MENTAL STATUS: Speech/cognition: Awake, alert, oriented to history taking and casual conversation CRANIAL NERVES: CN II: Visual fields are full to confrontation. Pupils are round equal and briskly reactive to light. CN III, IV, VI: extraocular movement are normal. No ptosis. CN V: Facial sensation is intact to light touch CN VII: Face is symmetric with normal eye closure  CN VIII: Hearing is normal to causal conversation. CN IX, X: Phonation is normal. CN XI: Head turning and shoulder shrug are intact  MOTOR: No significant upper or lower extremity muscle weakness  REFLEXES: Reflexes are 2+ and symmetric at the biceps, triceps, trace at knees, and ankles. Plantar responses are flexor.  SENSORY: Intact to light  touch, pinprick and vibratory sensation are intact in fingers and toes.  COORDINATION: There is no trunk or limb dysmetria noted.  GAIT/STANCE: Need push-up to get up from seated position, limited by her big body habitus and left knee pain  REVIEW OF SYSTEMS:  Full 14 system review of systems performed  and notable only for as above All other review of systems were negative.   ALLERGIES: Allergies  Allergen Reactions   Acetaminophen    Dicyclomine Hcl    Gabapentin Other (See Comments)    dizziness   Metoclopramide Other (See Comments)    Tremors   Percocet [Oxycodone-Acetaminophen]    Pregabalin Other (See Comments)    dizziness   Sulfa Antibiotics     Other reaction(s): Unknown Patient states she is not allergic to this medication.    HOME MEDICATIONS: Current Outpatient Medications  Medication Sig Dispense Refill   amLODipine-olmesartan (AZOR) 10-40 MG tablet Take 1 tablet by mouth daily.     aspirin 81 MG tablet Take 81 mg by mouth every morning. Reported on 02/06/2016     Cyanocobalamin (B-12) 1000 MCG TABS Take 1 tablet by mouth daily.     cyclobenzaprine (FLEXERIL) 5 MG tablet Take 5 mg by mouth at bedtime as needed.     escitalopram (LEXAPRO) 5 MG tablet Take 5 mg by mouth daily.     fluticasone (FLONASE) 50 MCG/ACT nasal spray Place 1 spray into both nostrils daily.      HYDROcodone-acetaminophen (NORCO/VICODIN) 5-325 MG tablet Take 1 tablet by mouth every 6 (six) hours as needed for moderate pain. 12 tablet 0   levothyroxine (SYNTHROID) 112 MCG tablet Take 112 mcg by mouth every morning.     meclizine (ANTIVERT) 25 MG tablet Take 25 mg by mouth as needed.     omeprazole (PRILOSEC) 20 MG capsule Take 20 mg by mouth every morning.      propranolol (INDERAL) 20 MG tablet Take 1 tablet by mouth daily.     methylPREDNISolone (MEDROL DOSEPAK) 4 MG TBPK tablet Take by mouth. As directed     No current facility-administered medications for this visit.    PAST MEDICAL HISTORY: Past Medical History:  Diagnosis Date   Anxiety    GERD (gastroesophageal reflux disease)    History of appendectomy    30 plus yrs ago   Hypertension    Hypothyroidism    Kidney stones    Low back pain    Migraine    Neuropathy    Non Hodgkin's lymphoma ([place]) 2015   Personal history of  chemotherapy    S/P partial hysterectomy    Thyroid disease    hypothyroidism    PAST SURGICAL HISTORY: Past Surgical History:  Procedure Laterality Date   ABDOMINAL HYSTERECTOMY     APPENDECTOMY     AXILLARY LYMPH NODE BIOPSY Left 2015   BREAST BIOPSY Right 07/15/1995   stereotactic biopsy at [place]     COLONOSCOPY WITH PROPOFOL N/A 12/20/2015   Procedure: COLONOSCOPY WITH PROPOFOL;  Surgeon: Hulen Luster, MD;  Location: [facility] ENDOSCOPY;  Service: Gastroenterology;  Laterality: N/A;   ESOPHAGOGASTRODUODENOSCOPY (EGD) WITH PROPOFOL N/A 12/20/2015   Procedure: ESOPHAGOGASTRODUODENOSCOPY (EGD) WITH PROPOFOL;  Surgeon: Hulen Luster, MD;  Location: [facility] ENDOSCOPY;  Service: Gastroenterology;  Laterality: N/A;   JOINT REPLACEMENT Left 01/2015   knee   TONSILLECTOMY      FAMILY HISTORY: Family History  Problem Relation Age of Onset  Lymphoma Mother    Pancreatic cancer Father    Liver cancer Father    Breast cancer Daughter 63   Breast cancer Daughter 61   Cancer Other        breast cancer, colon cancer, lymphoma, ovarian cancer   Diabetes Other    Anemia Other    CAD Other    Liver disease Other     SOCIAL HISTORY: Social History   Socioeconomic History   Marital status: Married    Spouse name: Not on file   Number of children: 2   Years of education: 12   Highest education level: High school graduate  Occupational History   Occupation: Retired  Tobacco Use   Smoking status: Former    Packs/day: 1.00    Years: 20.00    Total pack years: 20.00    Types: Cigarettes    Quit date: 01/27/1994    Years since quitting: 28.4   Smokeless tobacco: Never  Substance and Sexual Activity   Alcohol use: No    Alcohol/week: 0.0 standard drinks of alcohol   Drug use: No   Sexual activity: Yes  Other Topics Concern   Not on file  Social History Narrative   Live with husband.   Right-handed.   Caffeine use: only coffee, 1-2 cups  in summer, 3-4 cups in winter.   Social Determinants of Health   Financial Resource Strain: Not on file  Food Insecurity: Not on file  Transportation Needs: Not on file  Physical Activity: Not on file  Stress: Not on file  Social Connections: Not on file  Intimate Partner Violence: Not on file      Marcial Pacas, M.D. Ph.D.  [facility] [address], [place], [place] [address] Ph: [phone] Fax: [phone]  CC:  Garrel Ridgel, DPM [address] [place],  [place] [address]  Tracie Harrier, MD

## 2022-07-07 LAB — ANA W/REFLEX IF POSITIVE
Anti JO-1: <0.2 AI (ref 0.0–0.9)
Anti Nuclear Antibody (ANA): POSITIVE — AB
Centromere Ab Screen: <0.2 AI (ref 0.0–0.9)
Chromatin Ab SerPl-aCnc: <0.2 AI (ref 0.0–0.9)
ENA RNP Ab: <0.2 AI (ref 0.0–0.9)
ENA SM Ab Ser-aCnc: <0.2 AI (ref 0.0–0.9)
ENA SSA (RO) Ab: >8 AI — ABNORMAL HIGH (ref 0.0–0.9)
ENA SSB (LA) Ab: <0.2 AI (ref 0.0–0.9)
Scleroderma (Scl-70) (ENA) Antibody, IgG: <0.2 AI (ref 0.0–0.9)
dsDNA Ab: <1 IU/mL (ref 0–9)

## 2022-07-07 LAB — THYROID PANEL WITH TSH
Free Thyroxine Index: 3.3 (ref 1.2–4.9)
T3 Uptake Ratio: 30 % (ref 24–39)
T4, Total: 11.1 ug/dL (ref 4.5–12.0)
TSH: 0.049 u[IU]/mL — ABNORMAL LOW (ref 0.450–4.500)

## 2022-07-07 LAB — C-REACTIVE PROTEIN: CRP: <1 mg/L (ref 0–10)

## 2022-07-07 LAB — RPR: RPR Ser Ql: NONREACTIVE

## 2022-07-07 LAB — MULTIPLE MYELOMA PANEL, SERUM
Albumin SerPl Elph-Mcnc: 3.8 g/dL (ref 2.9–4.4)
Albumin/Glob SerPl: 1.4 (ref 0.7–1.7)
Alpha 1: 0.2 g/dL (ref 0.0–0.4)
Alpha2 Glob SerPl Elph-Mcnc: 0.8 g/dL (ref 0.4–1.0)
B-Globulin SerPl Elph-Mcnc: 0.8 g/dL (ref 0.7–1.3)
Gamma Glob SerPl Elph-Mcnc: 1 g/dL (ref 0.4–1.8)
Globulin, Total: 2.8 g/dL (ref 2.2–3.9)
IgA/Immunoglobulin A, Serum: 62 mg/dL — ABNORMAL LOW (ref 64–422)
IgG (Immunoglobin G), Serum: 978 mg/dL (ref 586–1602)
IgM (Immunoglobulin M), Srm: 99 mg/dL (ref 26–217)
Total Protein: 6.6 g/dL (ref 6.0–8.5)

## 2022-07-07 LAB — IRON,TIBC AND FERRITIN PANEL
Ferritin: 29 ng/mL (ref 15–150)
Iron Saturation: 25 % (ref 15–55)
Iron: 97 ug/dL (ref 27–139)
Total Iron Binding Capacity: 393 ug/dL (ref 250–450)
UIBC: 296 ug/dL (ref 118–369)

## 2022-07-07 LAB — CK: Total CK: 25 U/L — ABNORMAL LOW (ref 32–182)

## 2022-07-07 LAB — VITAMIN B12: Vitamin B-12: 950 pg/mL (ref 232–1245)

## 2022-07-07 LAB — SEDIMENTATION RATE: Sed Rate: 4 mm/hr (ref 0–40)

## 2022-07-08 ENCOUNTER — Telehealth: Payer: Self-pay | Admitting: *Deleted

## 2022-07-08 DIAGNOSIS — R768 Other specified abnormal immunological findings in serum: Secondary | ICD-10-CM

## 2022-07-08 NOTE — Telephone Encounter (Signed)
-----   Message from Melvenia Beam, MD sent at 07/08/2022 11:56 AM EDT ----- Selinda Eon: talk to patient, ANA is positive and so is sjogren's antibodies. I would talk to patient about it, some people have the antibodies but do not have the disease but still with positive values I would send to rheumatology for evaluation for this otherwise blood work looks fine thanks.

## 2022-07-08 NOTE — Telephone Encounter (Signed)
I spoke to the patient and discussed the lab findings. Reports issues with dry mouth and dry eyes. She thought it was being caused by her medications. Her dry eyes are significant enough to where she is using eye drops. She is agreeable to the rheumatology referral. Order placed in [place]. She is aware to expect a call for scheduling.

## 2022-07-08 NOTE — Addendum Note (Signed)
Addended by: Noberto Retort C on: 07/08/2022 02:47 PM   Modules accepted: Orders

## 2022-07-28 ENCOUNTER — Telehealth: Payer: Self-pay | Admitting: Neurology

## 2022-07-28 DIAGNOSIS — R768 Other specified abnormal immunological findings in serum: Secondary | ICD-10-CM

## 2022-07-28 NOTE — Addendum Note (Signed)
Addended by: Verlin Grills on: 07/28/2022 04:34 PM   Modules accepted: Orders

## 2022-07-28 NOTE — Telephone Encounter (Signed)
I have placed the referral

## 2022-07-28 NOTE — Telephone Encounter (Signed)
Can you place a new referral for this patient? She left me a message that she is wanting to go somewhere in [place]. Thanks!

## 2022-07-29 NOTE — Telephone Encounter (Signed)
Referral sent to [facility] [place], [place] [address]  Phone: [phone]

## 2022-10-15 ENCOUNTER — Other Ambulatory Visit: Payer: Self-pay | Admitting: Neurology

## 2022-12-09 ENCOUNTER — Other Ambulatory Visit: Payer: Self-pay | Admitting: Internal Medicine

## 2022-12-09 DIAGNOSIS — Z1231 Encounter for screening mammogram for malignant neoplasm of breast: Secondary | ICD-10-CM

## 2023-01-02 ENCOUNTER — Ambulatory Visit
Admission: RE | Admit: 2023-01-02 | Discharge: 2023-01-02 | Disposition: A | Discharge status: Home / Self Care | Payer: Medicare Other | Source: Ambulatory Visit | Attending: Internal Medicine | Admitting: Internal Medicine

## 2023-01-02 DIAGNOSIS — Z1231 Encounter for screening mammogram for malignant neoplasm of breast: Secondary | ICD-10-CM | POA: Insufficient documentation

## 2023-01-05 ENCOUNTER — Ambulatory Visit: Payer: Medicare Other | Admitting: Adult Health

## 2023-01-23 ENCOUNTER — Other Ambulatory Visit: Payer: Self-pay

## 2023-01-23 ENCOUNTER — Emergency Department
Admission: EM | Admit: 2023-01-23 | Discharge: 2023-01-23 | Disposition: A | Discharge status: Home / Self Care | Payer: Medicare Other | Attending: Emergency Medicine | Admitting: Emergency Medicine

## 2023-01-23 ENCOUNTER — Emergency Department: Payer: Medicare Other

## 2023-01-23 DIAGNOSIS — I10 Essential (primary) hypertension: Secondary | ICD-10-CM | POA: Insufficient documentation

## 2023-01-23 DIAGNOSIS — M79605 Pain in left leg: Secondary | ICD-10-CM

## 2023-01-23 DIAGNOSIS — M79662 Pain in left lower leg: Secondary | ICD-10-CM | POA: Diagnosis present

## 2023-01-23 DIAGNOSIS — I8393 Asymptomatic varicose veins of bilateral lower extremities: Secondary | ICD-10-CM | POA: Diagnosis not present

## 2023-01-23 MED ORDER — MEDICAL COMPRESSION STOCKINGS MISC: 0 refills | Status: AC

## 2023-01-23 NOTE — ED Triage Notes (Signed)
Pt to ED via POV from home. Pt referred to come in by PCP for possible blood clot. Pt reports pain to left leg. Pt states had xray of knee of showed fluid. Pt hx neuropathy. Pt denies blood thinners or hx blood clots.

## 2023-01-23 NOTE — ED Notes (Signed)
Patient is currently out of room for her [place]

## 2023-01-23 NOTE — Discharge Instructions (Signed)
You have been seen in the emergency department for leg discomfort.  This could be related to neuropathy but could also be related to mild edema or varicose veins.  Please wear your compression stockings when up and walking around.  You may take these off when resting or at nighttime, etc.  Please follow-up with your doctor.  Return to the emergency department for any symptom concerning to yourself.

## 2023-01-23 NOTE — ED Provider Notes (Signed)
[facility] Provider Note    Event Date/Time   First MD Initiated Contact with Patient 01/23/23 1140     (approximate)  History   Chief Complaint: Leg Pain (Left)  HPI  Kaitlin Thompson is a 72 y.o. female with a past medical history anxiety, gastric reflux, hypertension, presents emergency department for left lower leg pain.  According to the patient she states a history of neuropathy and chronic pain in both of her legs.  Also has occasional swelling in the legs left greater than right.  Saw her PCP for the same they were concerned for possible blood clot so they sent her to the emergency department.  Patient denies any prior blood clots.  No chest pain no trouble breathing no pleuritic pain.  Physical Exam   Triage Vital Signs: ED Triage Vitals  Enc Vitals Group     BP 01/23/23 1047 107/67     Pulse Rate 01/23/23 1044 84     Resp 01/23/23 1044 20     Temp 01/23/23 1044 98.6 F (37 C)     Temp Source 01/23/23 1044 Oral     SpO2 01/23/23 1044 98 %     Weight --      Height --      Head Circumference --      Peak Flow --      Pain Score 01/23/23 1045 4     Pain Loc --      Pain Edu? --      Excl. in [place]? --     Most recent vital signs: Vitals:   01/23/23 1044 01/23/23 1047  BP:  107/67  Pulse: 84   Resp: 20   Temp: 98.6 F (37 C)   SpO2: 98%     General: Awake, no distress.  CV:  Good peripheral perfusion.  Regular rate and rhythm  Resp:  Normal effort.  Equal breath sounds bilaterally.  Abd:  No distention.  Soft, nontender.  No rebound or guarding. Other:  Patient does have a slight amount of swelling in bilateral lower extremities do not see any unilateral swelling on my exam.  Neurovascular intact.  Patient does have quite a bit of smaller varicose veins throughout the lower extremities that seem to be somewhat tender to palpation.   ED Results / Procedures / Treatments   RADIOLOGY  Ultrasound negative for DVT   MEDICATIONS  ORDERED IN ED: Medications - No data to display   IMPRESSION / MDM / ASSESSMENT AND PLAN / ED COURSE  I reviewed the triage vital signs and the nursing notes.  Patient's presentation is most consistent with acute presentation with potential threat to life or bodily function.  Patient presents to the emergency department for left leg pain.  Patient has a history of neuropathy and chronic pain in the leg as well as mild amount of chronic swelling.  Patient does have varicose veins which could be contributing to her discomfort.  Leg is neurovascular intact no concerning findings on my physical exam.  Ultrasound is negative for DVT.  Given the negative ultrasound I believe the patient would be safe for PCP follow-up.  I do believe she would benefit from compression stockings which I will prescribe for the patient.  Patient agreeable to plan.  FINAL CLINICAL IMPRESSION(S) / ED DIAGNOSES   Leg pain  Rx / DC Orders   Compression stockings  Note:  This document was prepared using Dragon voice recognition software and may include unintentional dictation  errors.   Harvest Dark, MD 01/23/23 1242

## 2023-02-09 ENCOUNTER — Other Ambulatory Visit (INDEPENDENT_AMBULATORY_CARE_PROVIDER_SITE_OTHER): Payer: Self-pay | Admitting: Nurse Practitioner

## 2023-02-09 DIAGNOSIS — I739 Peripheral vascular disease, unspecified: Secondary | ICD-10-CM

## 2023-02-10 ENCOUNTER — Encounter (INDEPENDENT_AMBULATORY_CARE_PROVIDER_SITE_OTHER): Payer: Medicare Other | Admitting: Vascular Surgery

## 2023-02-10 ENCOUNTER — Encounter (INDEPENDENT_AMBULATORY_CARE_PROVIDER_SITE_OTHER): Payer: Medicare Other

## 2023-03-02 ENCOUNTER — Encounter (INDEPENDENT_AMBULATORY_CARE_PROVIDER_SITE_OTHER): Payer: Medicare Other | Admitting: Vascular Surgery

## 2023-03-02 ENCOUNTER — Encounter (INDEPENDENT_AMBULATORY_CARE_PROVIDER_SITE_OTHER): Payer: Medicare Other

## 2023-03-10 ENCOUNTER — Ambulatory Visit (INDEPENDENT_AMBULATORY_CARE_PROVIDER_SITE_OTHER): Payer: Medicare Other

## 2023-03-10 ENCOUNTER — Encounter (INDEPENDENT_AMBULATORY_CARE_PROVIDER_SITE_OTHER): Payer: Self-pay | Admitting: Vascular Surgery

## 2023-03-10 ENCOUNTER — Ambulatory Visit (INDEPENDENT_AMBULATORY_CARE_PROVIDER_SITE_OTHER): Payer: Medicare Other | Admitting: Vascular Surgery

## 2023-03-10 VITALS — BP 147/94 | HR 80 | Resp 18 | Ht 64.0 in | Wt 234.6 lb

## 2023-03-10 DIAGNOSIS — I10 Essential (primary) hypertension: Secondary | ICD-10-CM

## 2023-03-10 DIAGNOSIS — I739 Peripheral vascular disease, unspecified: Secondary | ICD-10-CM

## 2023-03-10 DIAGNOSIS — M79605 Pain in left leg: Secondary | ICD-10-CM

## 2023-03-10 DIAGNOSIS — M79604 Pain in right leg: Secondary | ICD-10-CM

## 2023-03-10 DIAGNOSIS — C8200 Follicular lymphoma grade I, unspecified site: Secondary | ICD-10-CM | POA: Diagnosis not present

## 2023-03-10 DIAGNOSIS — M79609 Pain in unspecified limb: Secondary | ICD-10-CM | POA: Insufficient documentation

## 2023-03-10 DIAGNOSIS — M7989 Other specified soft tissue disorders: Secondary | ICD-10-CM

## 2023-03-10 NOTE — Progress Notes (Signed)
Patient ID: Kaitlin Thompson, female   DOB: 26-Apr-1951, 72 y.o.   MRN: [id number]  Chief Complaint  Patient presents with   New Patient (Initial Visit)    np. ABI + consult. bilateral claudication of LE + discoloration of skin of foot. referred by defoor, Gwyndolyn Saxon    HPI Kaitlin Thompson is a 72 y.o. female.  I am asked to see the patient by Dr. Ginette Pitman for evaluation of leg pain and discoloration with poorly palpable pedal pulses.  The patient has been having trouble with her legs for many years.  She had a left knee replacement 8 years ago and says that leg has never been right since then.  She is predominantly bothered by discoloration and swelling in the leg.  She has significant neuropathy in both feet and legs.  She has previously tried Neurontin and Lyrica but could not tolerate the medications.  She denies any ulceration or infection.  She has been previously checked for a blood clot but did not find any.  She had ABIs ordered on referral and these were done today and were normal.  Right ABI was 1.08 left ABI was 1.12 with triphasic waveforms and normal digital pressures.  She has tried compression socks intermittently with these have not been done regularly and have not helped tremendously.  Her neuropathy makes it hard to wear the compression socks at times.   Past Medical History:  Diagnosis Date   Anxiety    GERD (gastroesophageal reflux disease)    History of appendectomy    30 plus yrs ago   Hypertension    Hypothyroidism    Kidney stones    Low back pain    Migraine    Neuropathy    Non Hodgkin's lymphoma 2015   Personal history of chemotherapy    S/P partial hysterectomy    Thyroid disease    hypothyroidism    Past Surgical History:  Procedure Laterality Date   ABDOMINAL HYSTERECTOMY     APPENDECTOMY     AXILLARY LYMPH NODE BIOPSY Left 2015   BREAST BIOPSY Right 07/15/1995   stereotactic biopsy at [place]      COLONOSCOPY WITH PROPOFOL N/A 12/20/2015   Procedure: COLONOSCOPY WITH PROPOFOL;  Surgeon: Hulen Luster, MD;  Location: [facility] ENDOSCOPY;  Service: Gastroenterology;  Laterality: N/A;   ESOPHAGOGASTRODUODENOSCOPY (EGD) WITH PROPOFOL N/A 12/20/2015   Procedure: ESOPHAGOGASTRODUODENOSCOPY (EGD) WITH PROPOFOL;  Surgeon: Hulen Luster, MD;  Location: [facility] ENDOSCOPY;  Service: Gastroenterology;  Laterality: N/A;   JOINT REPLACEMENT Left 01/2015   knee   TONSILLECTOMY       Family History  Problem Relation Age of Onset   Lymphoma Mother    Pancreatic cancer Father    Liver cancer Father    Breast cancer Daughter 24   Breast cancer Daughter 43   Cancer Other        breast cancer, colon cancer, lymphoma, ovarian cancer   Diabetes Other    Anemia Other    CAD Other    Liver disease Other       Social History   Tobacco Use   Smoking status: Former    Packs/day: 1.00    Years: 20.00    Additional pack years: 0.00    Total pack years: 20.00    Types: Cigarettes    Quit date: 01/27/1994    Years since quitting: 29.1   Smokeless tobacco: Never  Substance Use Topics   Alcohol use: No    Alcohol/week: 0.0 standard drinks of alcohol   Drug use: No     Allergies  Allergen Reactions   Acetaminophen    Dicyclomine Hcl    Gabapentin Other (See Comments)    dizziness   Metoclopramide Other (See Comments)    Tremors   Percocet [Oxycodone-Acetaminophen]    Pregabalin Other (See Comments)    dizziness   Sulfa Antibiotics     Other reaction(s): Unknown Patient states she is not allergic to this medication.    Current Outpatient Medications  Medication Sig Dispense Refill   acetaminophen (TYLENOL) 500 MG tablet Take by mouth.     amLODipine-olmesartan (AZOR) 10-40 MG tablet Take 1 tablet by mouth daily.     aspirin 81 MG tablet Take 81 mg by mouth every morning. Reported on 02/06/2016     atenolol (TENORMIN) 50 MG tablet Take by mouth.     Cyanocobalamin (B-12) 1000 MCG TABS Take 1 tablet  by mouth daily.     Elastic Bandages & Supports (MEDICAL COMPRESSION STOCKINGS) MISC Please provide 24mmHg compression stockings 1 each 0   fluticasone (FLONASE) 50 MCG/ACT nasal spray Place 1 spray into both nostrils daily.      HYDROcodone-acetaminophen (NORCO/VICODIN) 5-325 MG tablet Take 1 tablet by mouth every 6 (six) hours as needed for moderate pain. 12 tablet 0   hydroxychloroquine (PLAQUENIL) 200 MG tablet Take by mouth.     levothyroxine (SYNTHROID) 112 MCG tablet Take 112 mcg by mouth every morning.     omeprazole (PRILOSEC) 20 MG capsule Take 20 mg by mouth every morning.      propranolol (INDERAL) 20 MG tablet Take 1 tablet by mouth daily.     Vitamin D, Ergocalciferol, (DRISDOL) 1.25 MG (50000 UNIT) CAPS capsule Take by mouth.     cyclobenzaprine (FLEXERIL) 5 MG tablet Take 5 mg by mouth at bedtime as needed. (Patient not taking: Reported on 03/10/2023)     DULoxetine (CYMBALTA) 30 MG capsule TAKE 1 CAPSULE BY MOUTH DAILY. (Patient not taking: Reported on 03/10/2023) 30 capsule 3   escitalopram (LEXAPRO) 5 MG tablet Take 5 mg by mouth daily. (Patient not taking: Reported on 03/10/2023)     gabapentin (NEURONTIN) 100 MG capsule Take 3 capsules (300 mg total) by mouth at bedtime. (Patient not taking: Reported on 03/10/2023) 90 capsule 6   meclizine (ANTIVERT) 25 MG tablet Take 25 mg by mouth as needed. (Patient not taking: Reported on 03/10/2023)     methylPREDNISolone (MEDROL DOSEPAK) 4 MG TBPK tablet Take by mouth. As directed (Patient not taking: Reported on 03/10/2023)     No current facility-administered medications for this visit.      REVIEW OF SYSTEMS (Negative unless checked)  Constitutional: [] Weight loss  [] Fever  [] Chills Cardiac: [] Chest pain   [] Chest pressure   [] Palpitations   [] Shortness of breath when laying flat   [] Shortness of breath at rest   [] Shortness of breath with exertion. Vascular:  [x] Pain in legs with walking   [] Pain in legs at rest   [] Pain in legs when  laying flat   [x] Claudication   [] Pain in feet when walking  [] Pain in feet at rest  [] Pain in feet when laying flat   [] History of DVT   [] Phlebitis   [x] Swelling in legs   [] Varicose veins   [] Non-healing ulcers Pulmonary:   [] Uses home oxygen   [] Productive cough   [] Hemoptysis   [] Wheeze  [] COPD   [] Asthma  Neurologic:  [] Dizziness  [] Blackouts   [] Seizures   [] History of stroke   [] History of TIA  [] Aphasia   [] Temporary blindness   [] Dysphagia   [] Weakness or numbness in arms   [x] Weakness or numbness in legs Musculoskeletal:  [x] Arthritis   [] Joint swelling   [x] Joint pain   [] Low back pain Hematologic:  [] Easy bruising  [] Easy bleeding   [] Hypercoagulable state   [] Anemic  [] Hepatitis Gastrointestinal:  [] Blood in stool   [] Vomiting blood  [] Gastroesophageal reflux/heartburn   [] Abdominal pain Genitourinary:  [] Chronic kidney disease   [] Difficult urination  [] Frequent urination  [] Burning with urination   [] Hematuria Skin:  [] Rashes   [] Ulcers   [] Wounds Psychological:  [] History of anxiety   []  History of major depression.    Physical Exam BP (!) 147/94 (BP Location: Right Wrist)   Pulse 80   Resp 18   Ht 5\' 4"  (1.626 m)   Wt 234 lb 9.6 oz (106.4 kg)   BMI 40.27 kg/m  Gen:  WD/WN, NAD. obese Head: [place]/AT, No temporalis wasting.  Ear/Nose/Throat: Hearing grossly intact, nares w/o erythema or drainage, oropharynx w/o Erythema/Exudate Eyes: Conjunctiva clear, sclera non-icteric  Neck: trachea midline.  No JVD.  Pulmonary:  Good air movement, respirations not labored, no use of accessory muscles  Cardiac: RRR, no JVD Vascular:  Vessel Right Left  Radial Palpable Palpable                          DP 1+ 1+  PT 1+ 1+   Gastrointestinal:. No masses, surgical incisions, or scars. Musculoskeletal: M/S 5/5 throughout.  Extremities without ischemic changes.  No deformity or atrophy. 1+ BLE edema.  Mild hyperpigmentation and stasis dermatitis changes. Neurologic: Sensation grossly  intact in extremities.  Symmetrical.  Speech is fluent. Motor exam as listed above. Psychiatric: Judgment intact, Mood & affect appropriate for pt's clinical situation. Dermatologic: No rashes or ulcers noted.  No cellulitis or open wounds.    Radiology No results found.  Labs No results found for this or any previous visit (from the past 2160 hour(s)).  Assessment/Plan:  Pain in limb She had ABIs ordered on referral and these were done today and were normal.  Right ABI was 1.08 left ABI was 1.12 with triphasic waveforms and normal digital pressures.   I suspect her leg pain is due to a combination of neuropathy, arthritis, and swelling.  Swelling may be due to lymph and potentially venous disease in this workup as described below.  Compression socks, elevation, and increasing her activity would be of benefit.  We have discussed potential treatment for her neuropathy with consideration of a spinal cord stimulator and have offered her information and referral on this.  BP (high blood pressure) blood pressure control important in reducing the progression of atherosclerotic disease. On appropriate oral medications.   Swelling of limb Recommend:  I have had a long discussion with the patient regarding swelling and why it  causes symptoms.  Patient will begin wearing graduated compression on a daily basis. The patient will  wear the stockings first thing in the morning and removing them in the evening. The patient is instructed specifically not to sleep in the stockings.   In addition, behavioral modification will be initiated.  This will include frequent elevation, use of over the counter pain medications and exercise such as walking.  Consideration for a lymph pump will also be made based upon the effectiveness of conservative therapy.  This  would help to improve the edema control and prevent sequela such as ulcers and infections   Patient should undergo duplex ultrasound of the venous  system to ensure that DVT or reflux is not present.  The patient will follow-up with me after the ultrasound.       Leotis Pain 03/10/2023, 10:17 AM   This note was created with Dragon medical transcription system.  Any errors from dictation are unintentional.

## 2023-03-10 NOTE — Assessment & Plan Note (Signed)
Recommend:  I have had a long discussion with the patient regarding swelling and why it  causes symptoms.  Patient will begin wearing graduated compression on a daily basis. The patient will  wear the stockings first thing in the morning and removing them in the evening. The patient is instructed specifically not to sleep in the stockings.   In addition, behavioral modification will be initiated.  This will include frequent elevation, use of over the counter pain medications and exercise such as walking.  Consideration for a lymph pump will also be made based upon the effectiveness of conservative therapy.  This would help to improve the edema control and prevent sequela such as ulcers and infections   Patient should undergo duplex ultrasound of the venous system to ensure that DVT or reflux is not present.  The patient will follow-up with me after the ultrasound.   

## 2023-03-10 NOTE — Assessment & Plan Note (Signed)
She had ABIs ordered on referral and these were done today and were normal.  Right ABI was 1.08 left ABI was 1.12 with triphasic waveforms and normal digital pressures.   I suspect her leg pain is due to a combination of neuropathy, arthritis, and swelling.  Swelling may be due to lymph and potentially venous disease in this workup as described below.  Compression socks, elevation, and increasing her activity would be of benefit.  We have discussed potential treatment for her neuropathy with consideration of a spinal cord stimulator and have offered her information and referral on this.

## 2023-03-10 NOTE — Assessment & Plan Note (Signed)
blood pressure control important in reducing the progression of atherosclerotic disease. On appropriate oral medications.  

## 2023-03-11 LAB — VAS US ABI WITH/WO TBI
Left ABI: 1.12
Right ABI: 1.08

## 2023-04-08 ENCOUNTER — Ambulatory Visit (INDEPENDENT_AMBULATORY_CARE_PROVIDER_SITE_OTHER): Payer: Medicare Other

## 2023-04-08 ENCOUNTER — Encounter (INDEPENDENT_AMBULATORY_CARE_PROVIDER_SITE_OTHER): Payer: Self-pay | Admitting: Nurse Practitioner

## 2023-04-08 ENCOUNTER — Ambulatory Visit (INDEPENDENT_AMBULATORY_CARE_PROVIDER_SITE_OTHER): Payer: Medicare Other | Admitting: Nurse Practitioner

## 2023-04-08 VITALS — BP 120/80 | HR 85 | Resp 18 | Ht 65.0 in | Wt 227.6 lb

## 2023-04-08 DIAGNOSIS — M79605 Pain in left leg: Secondary | ICD-10-CM | POA: Diagnosis not present

## 2023-04-08 DIAGNOSIS — M7989 Other specified soft tissue disorders: Secondary | ICD-10-CM

## 2023-04-08 DIAGNOSIS — I10 Essential (primary) hypertension: Secondary | ICD-10-CM

## 2023-04-08 DIAGNOSIS — M79604 Pain in right leg: Secondary | ICD-10-CM

## 2023-04-08 NOTE — Progress Notes (Signed)
Subjective:    Patient ID: Kaitlin Thompson, female    DOB: Oct 30, 1951, 72 y.o.   MRN: [id number] Chief Complaint  Patient presents with   Follow-up    f/u with b.l. reflux    Kaitlin Thompson is a 72 y.o. female.  She returns today for evaluation of possible venous disease related to leg swelling and pain.  The patient is been having trouble with her legs for years.  She previously had a left knee replacement which has done well since then.  She also had a right knee replacement but it has given her some pain and issues since then.  She notes that she has pain with her lower extremities with any significant walking or standing in place.  She does have known significant neuropathy in her bilateral feet and legs.  She has previously tried Neurontin and Lyrica but she did not tolerate these well.  She also currently has some known lumbar radiculopathy and receives injections for this.  She denies any ulcerations or infections.  She has also been having some recent edema as well and there was concern that some of the pain could be related to this as well.  She notes that since her last visit she has been wearing compression socks as advised and the swelling has improved.  She previously had ABIs done which were normal with a right ABI of 1.08 and a left of 1.12 with good triphasic tibial waveforms bilaterally.  Today, the patient has no evidence of DVT or superficial thrombophlebitis bilaterally.  No evidence of deep venous insufficiency or superficial venous reflux bilaterally.  She also does have noted Baker's cyst bilaterally with the right measuring 2.9 cm x 1.9 cm and the left measuring 2.44 cm x 1.50 cm.  No flow seen within either cyst.    Review of Systems  Cardiovascular:  Positive for leg swelling.  Musculoskeletal:  Positive for arthralgias, back pain, gait problem and myalgias.  All other systems reviewed and are negative.      Objective:   Physical Exam Vitals reviewed.  HENT:      Head: Normocephalic.  Cardiovascular:     Rate and Rhythm: Normal rate.     Pulses:          Dorsalis pedis pulses are 1+ on the right side and 1+ on the left side.       Posterior tibial pulses are detected w/ Doppler on the right side and detected w/ Doppler on the left side.  Pulmonary:     Effort: Pulmonary effort is normal.  Musculoskeletal:     Right lower leg: 1+ Edema present.     Left lower leg: 1+ Edema present.  Skin:    General: Skin is warm and dry.  Neurological:     Mental Status: She is alert and oriented to person, place, and time.  Psychiatric:        Mood and Affect: Mood normal.        Behavior: Behavior normal.        Thought Content: Thought content normal.        Judgment: Judgment normal.     BP 120/80 (BP Location: Right Arm)   Pulse 85   Resp 18   Ht 5\' 5"  (1.651 m)   Wt 227 lb 9.6 oz (103.2 kg)   BMI 37.87 kg/m   Past Medical History:  Diagnosis Date   Anxiety    GERD (gastroesophageal reflux disease)    History of  appendectomy    30 plus yrs ago   Hypertension    Hypothyroidism    Kidney stones    Low back pain    Migraine    Neuropathy    Non Hodgkin's lymphoma (HCC) 2015   Personal history of chemotherapy    S/P partial hysterectomy    Thyroid disease    hypothyroidism    Social History   Socioeconomic History   Marital status: Married    Spouse name: Not on file   Number of children: 2   Years of education: 12   Highest education level: High school graduate  Occupational History   Occupation: Retired  Tobacco Use   Smoking status: Former    Packs/day: 1.00    Years: 20.00    Additional pack years: 0.00    Total pack years: 20.00    Types: Cigarettes    Quit date: 01/27/1994    Years since quitting: 29.2   Smokeless tobacco: Never  Substance and Sexual Activity   Alcohol use: No    Alcohol/week: 0.0 standard drinks of alcohol   Drug use: No   Sexual activity: Yes  Other Topics Concern   Not on file  Social  History Narrative   Live with husband.   Right-handed.   Caffeine use: only coffee, 1-2 cups in summer, 3-4 cups in winter.   Social Determinants of Health   Financial Resource Strain: Not on file  Food Insecurity: Not on file  Transportation Needs: Not on file  Physical Activity: Not on file  Stress: Not on file  Social Connections: Not on file  Intimate Partner Violence: Not on file    Past Surgical History:  Procedure Laterality Date   ABDOMINAL HYSTERECTOMY     APPENDECTOMY     AXILLARY LYMPH NODE BIOPSY Left 2015   BREAST BIOPSY Right 07/15/1995   stereotactic biopsy at [facility]- neg   CARPAL TUNNEL RELEASE     CERVICAL SPINE SURGERY     COLONOSCOPY WITH PROPOFOL N/A 12/20/2015   Procedure: COLONOSCOPY WITH PROPOFOL;  Surgeon: Wallace Cullens, MD;  Location: [facility] END[facility]COPY;  Service: Gastroenterology;  Laterality: N/A;   ESOPHAGOGASTRODUODENOSCOPY (EGD) WITH PROPOFOL N/A 12/20/2015   Procedure: ESOPHAGOGASTRODUODENOSCOPY (EGD) WITH PROPOFOL;  Surgeon: Wallace Cullens, MD;  Location: [facility] ENDOSCOPY;  Service: Gastroenterology;  Laterality: N/A;   JOINT REPLACEMENT Left 01/2015   knee   TONSILLECTOMY      Family History  Problem Relation Age of Onset   Lymphoma Mother    Pancreatic cancer Father    Liver cancer Father    Breast cancer Daughter 61   Breast cancer Daughter 55   Cancer Other        breast cancer, colon cancer, lymphoma, ovarian cancer   Diabetes Other    Anemia Other    CAD Other    Liver disease Other     Allergies  Allergen Reactions   Acetaminophen    Dicyclomine Hcl    Gabapentin Other (See Comments)    dizziness   Metoclopramide Other (See Comments)    Tremors   Percocet [Oxycodone-Acetaminophen]    Pregabalin Other (See Comments)    dizziness   Sulfa Antibiotics     Other reaction(s): Unknown Patient states she is not allergic to this medication.       Latest Ref Rng & Units 07/18/2020    3:03 PM 09/27/2019    9:40 AM 08/19/2019    9:27 AM  CBC   WBC 4.0 - 10.5 K/uL  6.9  6.4  6.1   Hemoglobin 12.0 - 15.0 g/dL 16.1  09.6  04.5   Hematocrit 36.0 - 46.0 % 38.6  45.3  41.4   Platelets 150 - 400 K/uL 279  260  255       CMP     Component Value Date/Time   NA 140 07/18/2020 1503   NA 139 01/08/2015 1058   K 4.3 07/18/2020 1503   K 4.3 01/08/2015 1058   CL 106 07/18/2020 1503   CL 107 01/08/2015 1058   CO2 27 07/18/2020 1503   CO2 26 01/08/2015 1058   GLUCOSE 88 07/18/2020 1503   GLUCOSE 96 01/08/2015 1058   BUN 16 07/18/2020 1503   BUN 11 01/08/2015 1058   CREATININE 0.74 07/18/2020 1503   CREATININE 0.63 01/08/2015 1058   CALCIUM 9.0 07/18/2020 1503   CALCIUM 9.3 01/08/2015 1058   PROT 6.6 07/01/2022 1134   PROT 7.0 07/11/2014 1010   ALBUMIN 4.1 07/18/2020 1503   ALBUMIN 3.6 07/11/2014 1010   AST 18 07/18/2020 1503   AST 23 07/11/2014 1010   ALT 14 07/18/2020 1503   ALT 25 07/11/2014 1010   ALKPHOS 117 07/18/2020 1503   ALKPHOS 84 07/11/2014 1010   BILITOT 0.6 07/18/2020 1503   BILITOT 0.4 07/11/2014 1010   GFRNONAA >60 07/18/2020 1503   GFRNONAA >60 01/08/2015 1058   GFRNONAA >60 07/11/2014 1010   GFRAA >60 07/18/2020 1503   GFRAA >60 01/08/2015 1058   GFRAA >60 07/11/2014 1010     VAS [place] ABI WITH/WO TBI  Result Date: 03/11/2023  LOWER EXTREMITY DOPPLER STUDY Patient Name:  TALLY MATTOX  Date of Exam:   03/10/2023 Medical Rec #: [id number]        Accession #:    [id number] Date of Birth: 08-15-1951        Patient Gender: F Patient Age:   80 years Exam Location:  [facility] Procedure:      VAS [place] ABI WITH/WO TBI Referring Phys: Festus Barren --------------------------------------------------------------------------------  Indications: Claudication.  Performing Technologist: Debbe Bales RVS  Examination Guidelines: A complete evaluation includes at minimum, Doppler waveform signals and systolic blood pressure reading at the level of bilateral brachial, anterior tibial, and posterior tibial arteries,  when vessel segments are accessible. Bilateral testing is considered an integral part of a complete examination. Photoelectric Plethysmograph (PPG) waveforms and toe systolic pressure readings are included as required and additional duplex testing as needed. Limited examinations for reoccurring indications may be performed as noted.  ABI Findings: +---------+------------------+-----+---------+--------+ Right    Rt Pressure (mmHg)IndexWaveform Comment  +---------+------------------+-----+---------+--------+ Brachial 165                                      +---------+------------------+-----+---------+--------+ ATA      161               0.98 triphasic         +---------+------------------+-----+---------+--------+ PTA      178               1.08 triphasic         +---------+------------------+-----+---------+--------+ Great Toe150               0.91 Normal            +---------+------------------+-----+---------+--------+ +---------+------------------+-----+---------+-------+ Left     Lt Pressure (mmHg)IndexWaveform Comment +---------+------------------+-----+---------+-------+ Brachial 158                                     +---------+------------------+-----+---------+-------+  ATA      170               1.03 triphasic        +---------+------------------+-----+---------+-------+ PTA      185               1.12 triphasic        +---------+------------------+-----+---------+-------+ Great Toe155               0.94 Normal           +---------+------------------+-----+---------+-------+ +-------+-----------+-----------+------------+------------+ ABI/TBIToday's ABIToday's TBIPrevious ABIPrevious TBI +-------+-----------+-----------+------------+------------+ Right  1.08       .91                                 +-------+-----------+-----------+------------+------------+ Left   1.12       .94                                  +-------+-----------+-----------+------------+------------+  Summary: Right: Resting right ankle-brachial index is within normal range. The right toe-brachial index is normal. Left: Resting left ankle-brachial index is within normal range. The left toe-brachial index is normal. *See table(s) above for measurements and observations.   Electronically signed by Festus Barren MD on 03/11/2023 at 7:18:39 AM.    Final        Assessment & Plan:   1. Pain in both lower extremities Based on the patient is not a this is studies from today as well as previously, I agree with Dr. Driscilla Grammes assessment that the pain is likely due to a combination of neuropathy, arthritis and some of the swelling.  However with the decrease in swelling she did not note any drastic improvement in her pain.  She will continue with her injections with Dr. Osie Bond as for her lower back.  She was also given a referral for possible spinal cord stimulator at her previous visit.  Based on the studies the patient has no significant arterial insufficiency as a cause for her pain.  Referral to possible pain specialist may be appropriate by PCP if other causes cannot be identified.  2. Primary hypertension Continue antihypertensive medications as already ordered, these medications have been reviewed and there are no changes at this time.  3. Swelling of limb Recommend:  I have had a long discussion with the patient regarding swelling and why it  causes symptoms.  Patient will begin wearing graduated compression on a daily basis a prescription was given. The patient will  wear the stockings first thing in the morning and removing them in the evening. The patient is instructed specifically not to sleep in the stockings.   In addition, behavioral modification will be initiated.  This will include frequent elevation, use of over the counter pain medications and exercise such as walking.  Consideration for a lymph pump will also be made based upon the  effectiveness of conservative therapy.  Will plan on having the patient return in 3 months to evaluate her progress with this.   Current Outpatient Medications on File Prior to Visit  Medication Sig Dispense Refill   acetaminophen (TYLENOL) 500 MG tablet Take by mouth.     amLODipine-olmesartan (AZOR) 10-40 MG tablet Take 1 tablet by mouth daily.     aspirin 81 MG tablet Take 81 mg by mouth every morning. Reported on 02/06/2016     atenolol (TENORMIN) 50  MG tablet Take by mouth.     Cyanocobalamin (B-12) 1000 MCG TABS Take 1 tablet by mouth daily.     DULoxetine (CYMBALTA) 30 MG capsule TAKE 1 CAPSULE BY MOUTH DAILY. 30 capsule 3   Elastic Bandages & Supports (MEDICAL COMPRESSION STOCKINGS) MISC Please provide compression stockings 1 each 0   fluticasone (FLONASE) 50 MCG/ACT nasal spray Place 1 spray into both nostrils daily.      gabapentin (NEURONTIN) 100 MG capsule Take 3 capsules (300 mg total) by mouth at bedtime. 90 capsule 6   HYDROcodone-acetaminophen (NORCO/VICODIN) 5-325 MG tablet Take 1 tablet by mouth every 6 (six) hours as needed for moderate pain. 12 tablet 0   hydroxychloroquine (PLAQUENIL) 200 MG tablet Take by mouth.     levothyroxine (SYNTHROID) 112 MCG tablet Take 112 mcg by mouth every morning.     omeprazole (PRILOSEC) 20 MG capsule Take 20 mg by mouth every morning.      propranolol (INDERAL) 20 MG tablet Take 1 tablet by mouth daily.     Vitamin D, Ergocalciferol, (DRISDOL) 1.25 MG (50000 UNIT) CAPS capsule Take by mouth.     cyclobenzaprine (FLEXERIL) 5 MG tablet Take 5 mg by mouth at bedtime as needed. (Patient not taking: Reported on 03/10/2023)     escitalopram (LEXAPRO) 5 MG tablet Take 5 mg by mouth daily. (Patient not taking: Reported on 03/10/2023)     meclizine (ANTIVERT) 25 MG tablet Take 25 mg by mouth as needed. (Patient not taking: Reported on 03/10/2023)     methylPREDNISolone (MEDROL DOSEPAK) 4 MG TBPK tablet Take by mouth. As directed (Patient not taking:  Reported on 03/10/2023)     No current facility-administered medications on file prior to visit.    There are no Patient Instructions on file for this visit. No follow-ups on file.   Georgiana Spinner, NP

## 2023-07-14 ENCOUNTER — Ambulatory Visit (INDEPENDENT_AMBULATORY_CARE_PROVIDER_SITE_OTHER): Payer: Medicare Other | Admitting: Vascular Surgery

## 2023-09-02 ENCOUNTER — Other Ambulatory Visit: Payer: Self-pay | Admitting: Family Medicine

## 2023-09-02 DIAGNOSIS — M5416 Radiculopathy, lumbar region: Secondary | ICD-10-CM

## 2023-09-02 DIAGNOSIS — M5136 Other intervertebral disc degeneration, lumbar region: Secondary | ICD-10-CM

## 2023-09-02 DIAGNOSIS — M48062 Spinal stenosis, lumbar region with neurogenic claudication: Secondary | ICD-10-CM

## 2023-09-18 ENCOUNTER — Ambulatory Visit
Admission: RE | Admit: 2023-09-18 | Discharge: 2023-09-18 | Disposition: A | Discharge status: Home / Self Care | Payer: Medicare Other | Source: Ambulatory Visit | Attending: Family Medicine | Admitting: Family Medicine

## 2023-09-18 DIAGNOSIS — M48062 Spinal stenosis, lumbar region with neurogenic claudication: Secondary | ICD-10-CM

## 2023-09-18 DIAGNOSIS — M5416 Radiculopathy, lumbar region: Secondary | ICD-10-CM

## 2023-09-18 DIAGNOSIS — M51369 Other intervertebral disc degeneration, lumbar region without mention of lumbar back pain or lower extremity pain: Secondary | ICD-10-CM

## 2023-12-04 ENCOUNTER — Other Ambulatory Visit: Payer: Self-pay | Admitting: Family Medicine

## 2023-12-04 DIAGNOSIS — Z1231 Encounter for screening mammogram for malignant neoplasm of breast: Secondary | ICD-10-CM

## 2024-01-04 ENCOUNTER — Ambulatory Visit
Admission: RE | Admit: 2024-01-04 | Discharge: 2024-01-04 | Disposition: A | Discharge status: Home / Self Care | Payer: Medicare Other | Source: Ambulatory Visit | Attending: Family Medicine | Admitting: Family Medicine

## 2024-01-04 DIAGNOSIS — Z1231 Encounter for screening mammogram for malignant neoplasm of breast: Secondary | ICD-10-CM | POA: Insufficient documentation

## 2024-04-26 ENCOUNTER — Encounter (INDEPENDENT_AMBULATORY_CARE_PROVIDER_SITE_OTHER): Payer: Self-pay

## 2024-06-20 ENCOUNTER — Other Ambulatory Visit: Payer: Self-pay | Admitting: Physical Medicine and Rehabilitation

## 2024-06-20 DIAGNOSIS — M533 Sacrococcygeal disorders, not elsewhere classified: Secondary | ICD-10-CM

## 2024-06-25 ENCOUNTER — Ambulatory Visit
Admission: RE | Admit: 2024-06-25 | Discharge: 2024-06-25 | Disposition: A | Discharge status: Home / Self Care | Source: Ambulatory Visit | Attending: Physical Medicine and Rehabilitation | Admitting: Physical Medicine and Rehabilitation

## 2024-06-25 DIAGNOSIS — M533 Sacrococcygeal disorders, not elsewhere classified: Secondary | ICD-10-CM

## 2024-12-05 ENCOUNTER — Other Ambulatory Visit
Admission: RE | Admit: 2024-12-05 | Discharge: 2024-12-05 | Disposition: A | Source: Ambulatory Visit | Attending: Family Medicine | Admitting: Family Medicine

## 2024-12-05 DIAGNOSIS — R079 Chest pain, unspecified: Secondary | ICD-10-CM | POA: Diagnosis present

## 2024-12-05 DIAGNOSIS — I10 Essential (primary) hypertension: Secondary | ICD-10-CM | POA: Insufficient documentation

## 2024-12-05 LAB — TROPONIN T, HIGH SENSITIVITY: Troponin T High Sensitivity: <15 ng/L (ref 0–19)

## 2024-12-07 ENCOUNTER — Other Ambulatory Visit: Payer: Self-pay | Admitting: Family Medicine

## 2024-12-07 DIAGNOSIS — Z1231 Encounter for screening mammogram for malignant neoplasm of breast: Secondary | ICD-10-CM

## 2025-01-04 ENCOUNTER — Ambulatory Visit
Admission: RE | Admit: 2025-01-04 | Discharge: 2025-01-04 | Disposition: A | Discharge status: Home / Self Care | Source: Ambulatory Visit | Attending: Family Medicine | Admitting: Family Medicine

## 2025-01-04 DIAGNOSIS — Z1231 Encounter for screening mammogram for malignant neoplasm of breast: Secondary | ICD-10-CM | POA: Insufficient documentation
